# Patient Record
Sex: Female | Born: 1950 | Race: White | Hispanic: No | Marital: Married | Smoking: Never smoker
Health system: Southern US, Community
[De-identification: ages and names within clinical notes are randomized; demographics above are authoritative.]

## PROBLEM LIST (undated history)

## (undated) DIAGNOSIS — G4730 Sleep apnea, unspecified: Secondary | ICD-10-CM

## (undated) DIAGNOSIS — F32A Depression, unspecified: Secondary | ICD-10-CM

## (undated) DIAGNOSIS — E785 Hyperlipidemia, unspecified: Secondary | ICD-10-CM

## (undated) DIAGNOSIS — N183 Chronic kidney disease, stage 3 unspecified: Secondary | ICD-10-CM

## (undated) DIAGNOSIS — I10 Essential (primary) hypertension: Secondary | ICD-10-CM

## (undated) DIAGNOSIS — F419 Anxiety disorder, unspecified: Secondary | ICD-10-CM

## (undated) DIAGNOSIS — Z9889 Other specified postprocedural states: Secondary | ICD-10-CM

## (undated) DIAGNOSIS — R011 Cardiac murmur, unspecified: Secondary | ICD-10-CM

## (undated) DIAGNOSIS — K219 Gastro-esophageal reflux disease without esophagitis: Secondary | ICD-10-CM

## (undated) DIAGNOSIS — M8580 Other specified disorders of bone density and structure, unspecified site: Secondary | ICD-10-CM

## (undated) DIAGNOSIS — M1990 Unspecified osteoarthritis, unspecified site: Secondary | ICD-10-CM

## (undated) DIAGNOSIS — I2510 Atherosclerotic heart disease of native coronary artery without angina pectoris: Secondary | ICD-10-CM

## (undated) DIAGNOSIS — I38 Endocarditis, valve unspecified: Secondary | ICD-10-CM

## (undated) DIAGNOSIS — F329 Major depressive disorder, single episode, unspecified: Secondary | ICD-10-CM

## (undated) HISTORY — DX: Endocarditis, valve unspecified: I38

## (undated) HISTORY — DX: Other specified postprocedural states: Z98.890

## (undated) HISTORY — DX: Chronic kidney disease, stage 3 unspecified: N18.30

## (undated) HISTORY — PX: KNEE ARTHROCENTESIS: SUR44

## (undated) HISTORY — DX: Cardiac murmur, unspecified: R01.1

## (undated) HISTORY — PX: ABDOMINAL HYSTERECTOMY: SHX81

## (undated) HISTORY — PX: COLONOSCOPY: SHX174

## (undated) HISTORY — DX: Other specified disorders of bone density and structure, unspecified site: M85.80

---

## 2002-05-27 ENCOUNTER — Emergency Department (HOSPITAL_COMMUNITY): Admission: EM | Admit: 2002-05-27 | Discharge: 2002-05-27 | Payer: Self-pay | Admitting: Emergency Medicine

## 2002-05-27 ENCOUNTER — Encounter: Payer: Self-pay | Admitting: Emergency Medicine

## 2008-06-11 ENCOUNTER — Inpatient Hospital Stay (HOSPITAL_COMMUNITY): Admission: RE | Admit: 2008-06-11 | Discharge: 2008-06-14 | Payer: Self-pay | Admitting: Orthopedic Surgery

## 2008-06-14 ENCOUNTER — Ambulatory Visit: Payer: Self-pay | Admitting: Physical Medicine & Rehabilitation

## 2008-07-29 ENCOUNTER — Encounter: Admission: RE | Admit: 2008-07-29 | Discharge: 2008-07-29 | Payer: Self-pay | Admitting: Family Medicine

## 2010-09-18 LAB — BASIC METABOLIC PANEL
BUN: 13 mg/dL (ref 6–23)
BUN: 7 mg/dL (ref 6–23)
CO2: 29 mEq/L (ref 19–32)
CO2: 29 mEq/L (ref 19–32)
CO2: 31 mEq/L (ref 19–32)
Calcium: 8.7 mg/dL (ref 8.4–10.5)
Calcium: 8.9 mg/dL (ref 8.4–10.5)
Calcium: 9.7 mg/dL (ref 8.4–10.5)
Chloride: 93 mEq/L — ABNORMAL LOW (ref 96–112)
Chloride: 93 mEq/L — ABNORMAL LOW (ref 96–112)
Chloride: 94 mEq/L — ABNORMAL LOW (ref 96–112)
Creatinine, Ser: 0.66 mg/dL (ref 0.4–1.2)
Creatinine, Ser: 0.77 mg/dL (ref 0.4–1.2)
Creatinine, Ser: 0.84 mg/dL (ref 0.4–1.2)
Creatinine, Ser: 0.95 mg/dL (ref 0.4–1.2)
GFR calc Af Amer: >60 mL/min (ref >60)
GFR calc Af Amer: >60 mL/min (ref >60)
GFR calc Af Amer: >60 mL/min (ref >60)
GFR calc non Af Amer: >60 mL/min (ref >60)
GFR calc non Af Amer: >60 mL/min (ref >60)
GFR calc non Af Amer: >60 mL/min (ref >60)
Glucose, Bld: 120 mg/dL — ABNORMAL HIGH (ref 70–99)
Glucose, Bld: 81 mg/dL (ref 70–99)
Potassium: 4.1 mEq/L (ref 3.5–5.1)
Sodium: 131 mEq/L — ABNORMAL LOW (ref 135–145)
Sodium: 134 mEq/L — ABNORMAL LOW (ref 135–145)

## 2010-09-18 LAB — URINALYSIS, ROUTINE W REFLEX MICROSCOPIC
Glucose, UA: NEGATIVE mg/dL
Nitrite: NEGATIVE
Protein, ur: NEGATIVE mg/dL
pH: 6 (ref 5.0–8.0)

## 2010-09-18 LAB — GLUCOSE, CAPILLARY
Glucose-Capillary: 114 mg/dL — ABNORMAL HIGH (ref 70–99)
Glucose-Capillary: 150 mg/dL — ABNORMAL HIGH (ref 70–99)
Glucose-Capillary: 153 mg/dL — ABNORMAL HIGH (ref 70–99)
Glucose-Capillary: 154 mg/dL — ABNORMAL HIGH (ref 70–99)
Glucose-Capillary: 157 mg/dL — ABNORMAL HIGH (ref 70–99)
Glucose-Capillary: 164 mg/dL — ABNORMAL HIGH (ref 70–99)

## 2010-09-18 LAB — CBC
HCT: 41 % (ref 36.0–46.0)
Hemoglobin: 13.6 g/dL (ref 12.0–15.0)
Hemoglobin: 8.9 g/dL — ABNORMAL LOW (ref 12.0–15.0)
MCHC: 33.2 g/dL (ref 30.0–36.0)
MCHC: 33.8 g/dL (ref 30.0–36.0)
MCHC: 33.9 g/dL (ref 30.0–36.0)
MCV: 81.2 fL (ref 78.0–100.0)
MCV: 81.5 fL (ref 78.0–100.0)
MCV: 81.9 fL (ref 78.0–100.0)
Platelets: 286 10*3/uL (ref 150–400)
Platelets: 291 10*3/uL (ref 150–400)
Platelets: 313 10*3/uL (ref 150–400)
RBC: 3.25 MIL/uL — ABNORMAL LOW (ref 3.87–5.11)
RBC: 3.91 MIL/uL (ref 3.87–5.11)
RBC: 5.01 MIL/uL (ref 3.87–5.11)
RDW: 14.4 % (ref 11.5–15.5)
RDW: 14.6 % (ref 11.5–15.5)
WBC: 10 10*3/uL (ref 4.0–10.5)
WBC: 8.9 10*3/uL (ref 4.0–10.5)
WBC: 9.1 10*3/uL (ref 4.0–10.5)

## 2010-09-18 LAB — DIFFERENTIAL
Eosinophils Relative: 3 % (ref 0–5)
Lymphocytes Relative: 44 % (ref 12–46)
Lymphs Abs: 4 10*3/uL (ref 0.7–4.0)
Monocytes Absolute: 0.6 10*3/uL (ref 0.1–1.0)

## 2010-09-18 LAB — PROTIME-INR
INR: 1.1 (ref 0.00–1.49)
INR: 1.4 (ref 0.00–1.49)
INR: 1.7 — ABNORMAL HIGH (ref 0.00–1.49)
Prothrombin Time: 14.9 seconds (ref 11.6–15.2)
Prothrombin Time: 20.4 seconds — ABNORMAL HIGH (ref 11.6–15.2)

## 2010-09-18 LAB — TYPE AND SCREEN: ABO/RH(D): O NEG

## 2010-09-18 LAB — ABO/RH: ABO/RH(D): O NEG

## 2010-10-17 NOTE — Op Note (Signed)
Shirley Juarez, Shirley Juarez                  ACCOUNT NO.:  [id number]   MEDICAL RECORD NO.:  [id number]          PATIENT TYPE:  INP   LOCATION:  2550                         FACILITY:  [facility]   PHYSICIAN:  Almedia Balls. Ranell Patrick, M.D. DATE OF BIRTH:  August 11, 1950   DATE OF PROCEDURE:  06/11/2008  DATE OF DISCHARGE:                               OPERATIVE REPORT   PREOPERATIVE DIAGNOSIS:  Left knee end-stage osteoarthritis.   POSTOPERATIVE DIAGNOSIS:  Left knee end-stage osteoarthritis.   PROCEDURE PERFORMED:  Left total knee replacement using DePuy Sigma  rotating-platform prosthesis.   ATTENDING SURGEON:  Almedia Balls. Ranell Patrick, MD   ASSISTANT:  Donnie Coffin. Dixon, PA-C   ANESTHESIA:  General anesthesia was used.   ESTIMATED BLOOD LOSS:  Minimal.   TOURNIQUET TIME:  1 hour and 30 minutes.   INSTRUMENT COUNTS:  Correct.   COMPLICATIONS:  None.   Preoperative antibiotics were given.   FLUID REPLACEMENT:  1500 mL of crystalloid.   INDICATIONS:  The patient is a 60 year old female with a history of  worsening left knee pain secondary to known arthritis.  The patient now  has disabling pain and significant function limitations secondary to  arthritis.  She has had multiple treatments with cortisone shots and  activity modification.  The patient presents now for operative treatment  to restore function and eliminate pain.  Informed consent was obtained.   DESCRIPTION OF PROCEDURE:  After an adequate level of anesthesia was  achieved, the patient was positioned supine on the operating table.  Left leg was correctly identified and nonsterile tourniquet was placed  on the left proximal thigh.  Left leg was sterilely prepped and draped  in the usual manner.  The right leg was padded appropriately and secured  to the operating table.  After sterile prep and drape to the left knee,  we flexed the knee and made the incision with the tourniquet elevated to  300 mmHg.  Longitudinal midline incision was  created with the knee in  flexion.  Medial parapatellar arthrotomy was created.  The patella was  everted.  Lateral patellofemoral ligament was divided.  Distal femur was  entered using a step-cut drill.  The distal femoral resection guide was  utilized.  We resected 10-mm 7 and 5 degrees left.  We incised the femur  to a size 3, which we used in anterior down reference and also went  ahead and referenced our epicondylar axis for placement of our 4-in-1  cutting block.  We then cut the anterior bone, which was sliced with the  anterior femur and then the posterior bones appeared to be appropriate  size mounts the  femoral condyle, and then resected our chamfer cuts.  We then went ahead and directed our attention towards the tibial side,  removed the ACL, PCL, and meniscal tissue; we then subluxed the tibia  anteriorly, made a 90-degree posterior cut with a couple of millimeters  of bone off the affected medial side.  This was perpendicular to long  axis of the tibia with minimal posterior slope, I used an oscillating  saw.  We then removed excess posterior bone off the posterior femoral  condyles.  We then went and incised the tibia.  We first checked our  flexion and extension gaps, which were symmetric and about 15 mm.  We  then went ahead and incised her tibia to a size 2.5, used our modular  tibial drill and keel punch, and then placed her tibial component on.  We then used the best box cut guide to remove the extra bone of the  femur for placement of the posterior cruciate-substituting prosthesis.  We then placed a size 3 trial left femur, released the knee with a 15  insert and were happy with the extension and flexion stability and we  were able to obtain full extension.  We then resurfaced the patella,  going down from 24 mm to 16 mm to replace with a size 32 patellar  button, which was 8-mm thick.  We drilled the lugs for that, placed the  patella in and took the knee through a  full range of motion.  We had  excellent soft tissue balancing and patellar tracking with no fingers  technique.  We then took the trial components out and plugged the end of  the femur with available bone.  I then thoroughly irrigated the knee and  then cemented the components into place using DePuy high-viscosity  cement.  Once the cement was allowed to harden, we removed excess cement  using a quarter inch curved osteotome, retrialed with a 17.5 and a 15.  The patient could not quite achieve full extension with the 17.5, best  when he had it move with 15 insert, it was slightly released with the  knee in flexion.  I felt like this would tighten up though with time.  The extension was perfect with no hyperextension.  At this point, we  placed the real 15 component in place, reduced the knee, again took her  through a full range of motion and were happy with that.  We thoroughly  irrigated and then closed the parapatellar arthrotomy with interrupted  #1 Vicryl suture followed by 2-0 Vicryl layered subcutaneous closure and  4-0 Monocryl for skin.  Steri-Strips were applied followed by sterile  dressing.  The patient tolerated the surgery well.      Almedia Balls. Ranell Patrick, M.D.  Electronically Signed     SRN/MEDQ  D:  06/11/2008  T:  06/12/2008  Job:  [id number]

## 2010-10-17 NOTE — Discharge Summary (Signed)
Shirley Juarez, Shirley Juarez                  ACCOUNT NO.:  [id number]   MEDICAL RECORD NO.:  [id number]          PATIENT TYPE:  INP   LOCATION:  5004                         FACILITY:  [facility]   PHYSICIAN:  Almedia Balls. Ranell Patrick, M.D. DATE OF BIRTH:  Apr 07, 1951   DATE OF ADMISSION:  06/11/2008  DATE OF DISCHARGE:  06/14/2008                               DISCHARGE SUMMARY   ADMISSION DIAGNOSIS:  Left knee end-stage osteoarthritis.   DISCHARGE DIAGNOSES:  1. Left knee end-stage osteoarthritis status post total knee      arthroplasty.  2. Blood loss anemia.   BRIEF HISTORY:  The patient is a 60 year old female with worsening left  knee pain secondary to osteoarthritis.  The patient elected to have a  left total knee arthroplasty.   PROCEDURE:  The patient had left total knee arthroplasty by Dr. Malon Kindle on June 11, 2008.  Assistant was [employer].  General  anesthesia was used.  Estimated blood loss was minimal.  No  complications.   HOSPITAL COURSE:  The patient was admitted on June 11, 2008, for the  above-stated procedure, which she tolerated well.  After adequate time  in the postanesthesia care unit, she was transferred up to 5000.  Postop  day #1, the patient had a moderate-to-severe pain in that left knee, was  able to work with physical therapy somewhat, and also worked with CPM.  Neurovascularly, she was intact.  Labs did show some mild blood loss  anemia, but she was asymptomatic.  The patient did continue with  physical therapy quite well over the next couple of days without any  dizziness but did drop down to 8.9 of her hemoglobin.  On date of  discharge, the patient was feeling well thus she is going to be  discharged home on ferrous sulfate.  Her INR was 1.7.  At the time of  discharge, her wound was healing well.  No signs of cellulitis,  erythema, or infection.  Neurovascularly, she is intact distally.   DISCHARGE PLAN:  The patient will be discharged home on  June 14, 2008.   FOLLOWUP:  The patient is to follow back up with Dr. Malon Kindle in 2  weeks.   CONDITION:  Stable.   Diet is regular.   ALLERGIES:  The patient has an allergy to CODEINE.   DISCHARGE MEDICATIONS:  1. Xanax 0.5 mg p.o. q.8 h. P.r.n.  2. Coumadin per pharmacy protocol.  3. Ferrous sulfate 325 mg p.o. t.i.d. with food.  4. Hydrochlorothiazide 25 mg p.o. nightly.  5. Insulin 111 units subcu t.i.d. with food.  6. Claritin 10 mg p.o. daily.  7. Glucophage 500 mg p.o. b.i.d.  8. Robaxin 500 mg q.6 h.  9. Sular 25.5 mg p.o. daily.  10.Benicar 40 mg p.o. nightly.  11.Protonix 40 mg p.o. daily.  12.Effexor 75 mg p.o. nightly.  13.Percocet 5/325 one to two tablets q.4-6 h. p.r.n. pain.      Thomas B. Durwin Nora, P.A.      Almedia Balls. Ranell Patrick, M.D.  Electronically Signed    TBD/MEDQ  D:  06/14/2008  T:  06/14/2008  Job:  [id number]

## 2010-10-20 NOTE — H&P (Signed)
Shirley Juarez, Shirley Juarez                  ACCOUNT NO.:  [id number]   MEDICAL RECORD NO.:  [id number]          PATIENT TYPE:  INP   LOCATION:                               FACILITY:  [facility]   PHYSICIAN:  Almedia Balls. Ranell Patrick, M.D. DATE OF BIRTH:  13-May-1951   DATE OF ADMISSION:  06/12/2007  DATE OF DISCHARGE:                              HISTORY & PHYSICAL   CHIEF COMPLAINT:  Left knee pain.   HISTORY OF PRESENT ILLNESS:  The patient is a 60 year old female with  worsening left knee pain and has been refractory to conservative  treatment.  The patient has elected to have a total knee arthroplasty.   PAST MEDICAL HISTORY:  1. Diabetes.  2. Hypertension.  3. Vertigo.  4. Sleep apnea.  5. GERD.  6. Hyperlipidemia.   FAMILY MEDICAL HISTORY:  COPD.   SOCIAL HISTORY:  Patient of Dr. Shaune Pollack.  Is married.  Does not  smoke or use alcohol.   DRUG ALLERGIES:  CODEINE.   CURRENT MEDICATIONS:  1. Zetia 10 mg p.o. daily.  2. Sular 20 mg p.o. daily.  3. Benicar 40/12.5 mg p.o. daily.  4. Prevacid 30 mg p.o. daily.  5. Zyrtec 10 mg p.o. daily.  6. Hydroxyzine 25 mg p.o. daily.  7. Xanax 0.5 mg t.i.d. p.r.n.  8. Metformin 500 mg b.i.d.  9. Pristiq 1 tablet daily.   REVIEW OF SYSTEMS:  She has pain with ambulation.   PHYSICAL EXAMINATION:  VITAL SIGNS:  Pulse 78, respirations 16, blood  pressure 128/72.  GENERAL:  Otherwise, a healthy-appearing 60 year old female in no acute  distress.  Pleasant mood and affect, alert and oriented x3.  HEAD AND NECK:  Cranial nerves II through XII grossly intact.  She has  full range of motion without any tenderness.  CHEST:  Active breath sounds bilaterally.  No wheezes, rhonchi, or  rales.  HEART:  Regular rate and rhythm.  No murmur.  ABDOMEN:  Nontender, nondistended with active bowel sounds.  EXTREMITIES:  She has moderate tenderness to the left knee, especially  medially with decreased range of motion.  SKIN:  No rashes or edema.  NEUROVASCULAR:   She is intact distally.  She does have a normal heel-toe  gait.   X-rays show end-stage osteoarthritis, left knee.   IMPRESSION:  End-stage osteoarthritis, left knee.   PLAN OF ACTION:  Left total knee arthroplasty by Dr. Malon Kindle.      Thomas B. Durwin Nora, P.A.      Almedia Balls. Ranell Patrick, M.D.  Electronically Signed    TBD/MEDQ  D:  05/18/2008  T:  05/18/2008  Job:  [id number]

## 2011-06-06 ENCOUNTER — Encounter (HOSPITAL_COMMUNITY): Payer: Self-pay | Admitting: Pharmacy Technician

## 2011-06-08 ENCOUNTER — Encounter (HOSPITAL_COMMUNITY)
Admission: RE | Admit: 2011-06-08 | Discharge: 2011-06-08 | Disposition: A | Discharge status: Home / Self Care | Payer: Federal, State, Local not specified - PPO | Source: Ambulatory Visit | Attending: Anesthesiology | Admitting: Anesthesiology

## 2011-06-08 ENCOUNTER — Encounter (HOSPITAL_COMMUNITY)
Admission: RE | Admit: 2011-06-08 | Discharge: 2011-06-08 | Disposition: A | Discharge status: Home / Self Care | Payer: Federal, State, Local not specified - PPO | Source: Ambulatory Visit | Attending: Orthopedic Surgery | Admitting: Orthopedic Surgery

## 2011-06-08 ENCOUNTER — Encounter (HOSPITAL_COMMUNITY): Payer: Self-pay

## 2011-06-08 ENCOUNTER — Other Ambulatory Visit: Payer: Self-pay

## 2011-06-08 HISTORY — DX: Unspecified osteoarthritis, unspecified site: M19.90

## 2011-06-08 HISTORY — DX: Hyperlipidemia, unspecified: E78.5

## 2011-06-08 HISTORY — DX: Gastro-esophageal reflux disease without esophagitis: K21.9

## 2011-06-08 HISTORY — DX: Essential (primary) hypertension: I10

## 2011-06-08 HISTORY — DX: Depression, unspecified: F32.A

## 2011-06-08 HISTORY — DX: Anxiety disorder, unspecified: F41.9

## 2011-06-08 HISTORY — DX: Atherosclerotic heart disease of native coronary artery without angina pectoris: I25.10

## 2011-06-08 HISTORY — DX: Sleep apnea, unspecified: G47.30

## 2011-06-08 HISTORY — DX: Major depressive disorder, single episode, unspecified: F32.9

## 2011-06-08 LAB — CBC
Hemoglobin: 12.1 g/dL (ref 12.0–15.0)
MCH: 26.9 pg (ref 26.0–34.0)
MCHC: 32.9 g/dL (ref 30.0–36.0)
MCV: 81.8 fL (ref 78.0–100.0)
RBC: 4.5 MIL/uL (ref 3.87–5.11)

## 2011-06-08 LAB — BASIC METABOLIC PANEL
BUN: 15 mg/dL (ref 6–23)
CO2: 28 mEq/L (ref 19–32)
Calcium: 9.6 mg/dL (ref 8.4–10.5)
Creatinine, Ser: 0.97 mg/dL (ref 0.50–1.10)
GFR calc non Af Amer: 62 mL/min — ABNORMAL LOW (ref >90)
Glucose, Bld: 98 mg/dL (ref 70–99)

## 2011-06-08 NOTE — Pre-Procedure Instructions (Addendum)
20 Shirley Juarez  06/08/2011   Your procedure is scheduled on: Thursday, January 18th. Report to [facility] at 8:00  AM.  Call this number if you have problems the morning of surgery: [phone]   Remember:   Do not eat food:After Midnight.  May have clear liquids: up to 4 Hours before arrival. (until 4:00am)  Clear liquids include soda, tea, black coffee, apple or grape juice, broth.  Take these medicines the morning of surgery with A SIP OF WATER:  Xanax, Zyreck, Pristiq, Prevacid, Sular   Do not wear jewelry, make-up or nail polish.  Do not wear lotions, powders, or perfumes. You may wear deodorant.  Do not shave 48 hours prior to surgery.  Do not bring valuables to the hospital.  Contacts, dentures or bridgework may not be worn into surgery.  Leave suitcase in the car. After surgery it may be brought to your room.  For patients admitted to the hospital, checkout time is 11:00 AM the day of discharge.   Patients discharged the day of surgery will not be allowed to drive home.  Name and phone number of your driver: NA  Special Instructions: CHG Shower Use Special Wash: 1/2 bottle night before surgery and 1/2 bottle morning of surgery.   Please read over the following fact sheets that you were given: Pain Booklet, Coughing and Deep Breathing, Blood Transfusion Information, MRSA Information and Surgical Site Infection Prevention

## 2011-06-08 NOTE — Progress Notes (Signed)
Pt was given information on Incentive and reviewed.  Pt states she has used I /S before and did not need further instructions.

## 2011-06-14 NOTE — H&P (Signed)
CC: right Knee pain HPI:   61 y/o female with worsening knee pain secondary to osteoarthritis. Patient has elected to have a total knee arthroplasty to decrease pain and increase function. UYQ:[id number], hyperlipidemia, GERD, diabetes  Mellitus, coronary artery disease Family History:CAD Social:no smoking, no etoh,  Dr. Shaune Pollack Meds:citracal, zyrtec, aspirin, prevacid, gemfibrozil, sular, xanax, pristig, zetia, metformin, estradiol, losartan, hydroxyzine Allergies: codeine, zocor, lipitor, crestor, welchol, fishoil, azithromycin ROS: Pain with ambulation Vitals:74 16 138/80 PE: Alert and appropriate 60 y/o female In no acute distress Cranial 2-12 grossly intact Cervical spine full rom without pain Lungs: bilateral breath sounds with no wheeze rhonchi or rales Heart: regular rate and rhythm with no murmur Abd: nontender nondistended with active bowel sounds Ext: moderate pain with rom of the right knee with crepitus, antalgic gait, neurovascularly intact distally. No pedal edema Skin: no rashes X-rays: endstage osteoarthritis to knee A/P: endstage osteoarthritis to knee Plan for total knee arthroplasty to decrease pain and increase function.

## 2011-06-21 MED ORDER — CHLORHEXIDINE GLUCONATE 4 % EX LIQD: 60.0000 mL | Freq: Once | CUTANEOUS | Status: DC

## 2011-06-21 MED ORDER — CEFAZOLIN SODIUM 1-5 GM-% IV SOLN
1.0000 g | INTRAVENOUS | Status: AC
  Administered 2011-06-22: 1 g via INTRAVENOUS
  Filled 2011-06-21: qty 50

## 2011-06-22 ENCOUNTER — Encounter (HOSPITAL_COMMUNITY): Payer: Self-pay | Admitting: Anesthesiology

## 2011-06-22 ENCOUNTER — Inpatient Hospital Stay (HOSPITAL_COMMUNITY)
Admission: RE | Admit: 2011-06-22 | Discharge: 2011-06-25 | DRG: 209 | Disposition: A | Discharge status: Home Health | Payer: Federal, State, Local not specified - PPO | Source: Ambulatory Visit | Attending: Orthopedic Surgery | Admitting: Orthopedic Surgery

## 2011-06-22 ENCOUNTER — Encounter (HOSPITAL_COMMUNITY)
Admission: RE | Disposition: A | Discharge status: Home Health | Payer: Self-pay | Source: Ambulatory Visit | Attending: Orthopedic Surgery

## 2011-06-22 ENCOUNTER — Encounter (HOSPITAL_COMMUNITY): Payer: Self-pay | Admitting: *Deleted

## 2011-06-22 ENCOUNTER — Ambulatory Visit (HOSPITAL_COMMUNITY): Payer: Federal, State, Local not specified - PPO | Admitting: Anesthesiology

## 2011-06-22 ENCOUNTER — Ambulatory Visit (HOSPITAL_COMMUNITY): Payer: Federal, State, Local not specified - PPO

## 2011-06-22 DIAGNOSIS — Z01812 Encounter for preprocedural laboratory examination: Secondary | ICD-10-CM

## 2011-06-22 DIAGNOSIS — K219 Gastro-esophageal reflux disease without esophagitis: Secondary | ICD-10-CM | POA: Diagnosis present

## 2011-06-22 DIAGNOSIS — E785 Hyperlipidemia, unspecified: Secondary | ICD-10-CM | POA: Diagnosis present

## 2011-06-22 DIAGNOSIS — M1710 Unilateral primary osteoarthritis, unspecified knee: Principal | ICD-10-CM | POA: Diagnosis present

## 2011-06-22 DIAGNOSIS — I2510 Atherosclerotic heart disease of native coronary artery without angina pectoris: Secondary | ICD-10-CM | POA: Diagnosis present

## 2011-06-22 DIAGNOSIS — I10 Essential (primary) hypertension: Secondary | ICD-10-CM | POA: Diagnosis present

## 2011-06-22 DIAGNOSIS — E119 Type 2 diabetes mellitus without complications: Secondary | ICD-10-CM | POA: Diagnosis present

## 2011-06-22 HISTORY — PX: TOTAL KNEE ARTHROPLASTY: SHX125

## 2011-06-22 LAB — GLUCOSE, CAPILLARY: Glucose-Capillary: 143 mg/dL — ABNORMAL HIGH (ref 70–99)

## 2011-06-22 SURGERY — ARTHROPLASTY, KNEE, TOTAL
Anesthesia: General | Site: Knee | Laterality: Right | Wound class: Clean

## 2011-06-22 MED ORDER — METOCLOPRAMIDE HCL 10 MG PO TABS: 5.0000 mg | ORAL_TABLET | Freq: Three times a day (TID) | ORAL | Status: DC | PRN

## 2011-06-22 MED ORDER — METOCLOPRAMIDE HCL 5 MG/ML IJ SOLN: 10.0000 mg | Freq: Once | INTRAMUSCULAR | Status: DC | PRN

## 2011-06-22 MED ORDER — DIPHENHYDRAMINE HCL 50 MG/ML IJ SOLN: 12.5000 mg | Freq: Four times a day (QID) | INTRAMUSCULAR | Status: DC | PRN

## 2011-06-22 MED ORDER — LIDOCAINE HCL (PF) 1 % IJ SOLN
INTRAMUSCULAR | Status: DC | PRN
  Administered 2011-06-22: 2 mL

## 2011-06-22 MED ORDER — DESVENLAFAXINE SUCCINATE ER 50 MG PO TB24
50.0000 mg | ORAL_TABLET | Freq: Every day | ORAL | Status: DC
  Administered 2011-06-22 – 2011-06-23 (×2): 50 mg via ORAL
  Filled 2011-06-22 (×4): qty 1

## 2011-06-22 MED ORDER — METOCLOPRAMIDE HCL 5 MG/ML IJ SOLN
INTRAMUSCULAR | Status: DC | PRN
  Administered 2011-06-22: 10 mg via INTRAVENOUS

## 2011-06-22 MED ORDER — ONDANSETRON HCL 4 MG/2ML IJ SOLN
4.0000 mg | Freq: Four times a day (QID) | INTRAMUSCULAR | Status: DC | PRN
  Administered 2011-06-22 – 2011-06-23 (×3): 4 mg via INTRAVENOUS

## 2011-06-22 MED ORDER — NISOLDIPINE ER 25.5 MG PO TB24
25.5000 mg | ORAL_TABLET | ORAL | Status: DC
  Administered 2011-06-23 – 2011-06-25 (×3): 25.5 mg via ORAL
  Filled 2011-06-22 (×4): qty 1

## 2011-06-22 MED ORDER — DROPERIDOL 2.5 MG/ML IJ SOLN
INTRAMUSCULAR | Status: DC | PRN
  Administered 2011-06-22: 0.625 mg via INTRAVENOUS

## 2011-06-22 MED ORDER — ONDANSETRON HCL 4 MG/2ML IJ SOLN
INTRAMUSCULAR | Status: DC | PRN
  Administered 2011-06-22: 4 mg via INTRAVENOUS

## 2011-06-22 MED ORDER — CEFAZOLIN SODIUM 1-5 GM-% IV SOLN
1.0000 g | Freq: Four times a day (QID) | INTRAVENOUS | Status: AC
  Administered 2011-06-22 – 2011-06-23 (×3): 1 g via INTRAVENOUS
  Filled 2011-06-22 (×3): qty 50

## 2011-06-22 MED ORDER — EZETIMIBE 10 MG PO TABS
10.0000 mg | ORAL_TABLET | Freq: Every day | ORAL | Status: DC
  Administered 2011-06-22 – 2011-06-24 (×3): 10 mg via ORAL
  Filled 2011-06-22 (×4): qty 1

## 2011-06-22 MED ORDER — METFORMIN HCL 500 MG PO TABS
500.0000 mg | ORAL_TABLET | Freq: Two times a day (BID) | ORAL | Status: DC
  Administered 2011-06-22 – 2011-06-25 (×6): 500 mg via ORAL
  Filled 2011-06-22 (×8): qty 1

## 2011-06-22 MED ORDER — CALCIUM CARBONATE-VITAMIN D 600-400 MG-UNIT PO CHEW: 1.0000 | CHEWABLE_TABLET | Freq: Every day | ORAL | Status: DC

## 2011-06-22 MED ORDER — BUPIVACAINE HCL (PF) 0.5 % IJ SOLN
INTRAMUSCULAR | Status: DC | PRN
  Administered 2011-06-22: 25 mL

## 2011-06-22 MED ORDER — GLYCOPYRROLATE 0.2 MG/ML IJ SOLN
INTRAMUSCULAR | Status: DC | PRN
Start: 2011-06-22 — End: 2011-06-22
  Administered 2011-06-22: .4 mg via INTRAVENOUS

## 2011-06-22 MED ORDER — LOSARTAN POTASSIUM-HCTZ 100-25 MG PO TABS: 1.0000 | ORAL_TABLET | Freq: Every day | ORAL | Status: DC

## 2011-06-22 MED ORDER — ACETAMINOPHEN 500 MG PO TABS: 1000.0000 mg | ORAL_TABLET | Freq: Four times a day (QID) | ORAL | Status: DC | PRN

## 2011-06-22 MED ORDER — SODIUM CHLORIDE 0.9 % IV SOLN: INTRAVENOUS | Status: DC

## 2011-06-22 MED ORDER — CALCIUM CITRATE 950 (200 CA) MG PO TABS
1.0000 | ORAL_TABLET | Freq: Every day | ORAL | Status: DC
  Administered 2011-06-22 – 2011-06-25 (×4): 200 mg via ORAL
  Filled 2011-06-22 (×4): qty 1

## 2011-06-22 MED ORDER — LORATADINE 10 MG PO TABS
10.0000 mg | ORAL_TABLET | Freq: Every day | ORAL | Status: DC
  Administered 2011-06-22: 10 mg via ORAL
  Filled 2011-06-22 (×5): qty 1

## 2011-06-22 MED ORDER — OXYCODONE-ACETAMINOPHEN 5-325 MG PO TABS
1.0000 | ORAL_TABLET | ORAL | Status: DC | PRN
  Administered 2011-06-23: 2 via ORAL
  Administered 2011-06-23: 1 via ORAL
  Administered 2011-06-23: 2 via ORAL
  Administered 2011-06-23: 1 via ORAL
  Administered 2011-06-24 – 2011-06-25 (×6): 2 via ORAL
  Filled 2011-06-22 (×3): qty 2
  Filled 2011-06-22: qty 1
  Filled 2011-06-22 (×4): qty 2
  Filled 2011-06-22: qty 1
  Filled 2011-06-22: qty 2

## 2011-06-22 MED ORDER — WARFARIN VIDEO: Freq: Once | Status: DC

## 2011-06-22 MED ORDER — HYDROMORPHONE HCL PF 1 MG/ML IJ SOLN
0.2500 mg | INTRAMUSCULAR | Status: DC | PRN
  Administered 2011-06-22 (×2): 0.5 mg via INTRAVENOUS

## 2011-06-22 MED ORDER — HYDROMORPHONE HCL PF 1 MG/ML IJ SOLN
0.5000 mg | INTRAMUSCULAR | Status: DC | PRN
  Administered 2011-06-22 (×2): 1 mg via INTRAVENOUS
  Filled 2011-06-22 (×2): qty 1

## 2011-06-22 MED ORDER — HYDROMORPHONE 0.3 MG/ML IV SOLN
INTRAVENOUS | Status: DC
  Administered 2011-06-22: 15:00:00 via INTRAVENOUS
  Administered 2011-06-23: 2.7 mg via INTRAVENOUS
  Filled 2011-06-22: qty 25

## 2011-06-22 MED ORDER — ALPRAZOLAM 0.5 MG PO TABS
0.5000 mg | ORAL_TABLET | ORAL | Status: DC
  Administered 2011-06-23 – 2011-06-25 (×3): 0.5 mg via ORAL
  Filled 2011-06-22 (×3): qty 1

## 2011-06-22 MED ORDER — FERROUS SULFATE 325 (65 FE) MG PO TABS
325.0000 mg | ORAL_TABLET | Freq: Three times a day (TID) | ORAL | Status: DC
  Administered 2011-06-22 – 2011-06-25 (×9): 325 mg via ORAL
  Filled 2011-06-22 (×12): qty 1

## 2011-06-22 MED ORDER — PATIENT'S GUIDE TO USING COUMADIN BOOK
Freq: Once | Status: AC
  Administered 2011-06-22: 16:00:00
  Filled 2011-06-22: qty 1

## 2011-06-22 MED ORDER — ASPIRIN EC 81 MG PO TBEC
81.0000 mg | DELAYED_RELEASE_TABLET | Freq: Every day | ORAL | Status: DC
  Administered 2011-06-22 – 2011-06-24 (×3): 81 mg via ORAL
  Filled 2011-06-22 (×4): qty 1

## 2011-06-22 MED ORDER — MENTHOL 3 MG MT LOZG: 1.0000 | LOZENGE | OROMUCOSAL | Status: DC | PRN

## 2011-06-22 MED ORDER — CALCIUM CARBONATE-VITAMIN D 500-200 MG-UNIT PO TABS
1.0000 | ORAL_TABLET | Freq: Every day | ORAL | Status: DC
  Administered 2011-06-22 – 2011-06-25 (×4): 1 via ORAL
  Filled 2011-06-22 (×4): qty 1

## 2011-06-22 MED ORDER — ESTRADIOL 2 MG PO TABS
2.0000 mg | ORAL_TABLET | Freq: Every day | ORAL | Status: DC
  Administered 2011-06-22 – 2011-06-25 (×2): 2 mg via ORAL
  Filled 2011-06-22 (×4): qty 1

## 2011-06-22 MED ORDER — METOCLOPRAMIDE HCL 5 MG/ML IJ SOLN
5.0000 mg | Freq: Three times a day (TID) | INTRAMUSCULAR | Status: DC | PRN
  Filled 2011-06-22: qty 2

## 2011-06-22 MED ORDER — HYDROXYZINE HCL 25 MG PO TABS
25.0000 mg | ORAL_TABLET | Freq: Every day | ORAL | Status: DC
  Administered 2011-06-23 – 2011-06-25 (×3): 25 mg via ORAL
  Filled 2011-06-22 (×3): qty 1

## 2011-06-22 MED ORDER — ACETAMINOPHEN 650 MG RE SUPP: 650.0000 mg | Freq: Four times a day (QID) | RECTAL | Status: DC | PRN

## 2011-06-22 MED ORDER — MIDAZOLAM HCL 5 MG/5ML IJ SOLN
INTRAMUSCULAR | Status: DC | PRN
  Administered 2011-06-22: 2 mg via INTRAVENOUS

## 2011-06-22 MED ORDER — HYDROCHLOROTHIAZIDE 25 MG PO TABS
25.0000 mg | ORAL_TABLET | Freq: Every day | ORAL | Status: DC
  Administered 2011-06-22 – 2011-06-25 (×4): 25 mg via ORAL
  Filled 2011-06-22 (×4): qty 1

## 2011-06-22 MED ORDER — ONDANSETRON HCL 4 MG/2ML IJ SOLN
4.0000 mg | Freq: Four times a day (QID) | INTRAMUSCULAR | Status: DC | PRN
  Filled 2011-06-22 (×3): qty 2

## 2011-06-22 MED ORDER — NAPHAZOLINE-PHENIRAMINE 0.025-0.3 % OP SOLN
1.0000 [drp] | Freq: Four times a day (QID) | OPHTHALMIC | Status: DC | PRN
  Filled 2011-06-22: qty 15

## 2011-06-22 MED ORDER — NALOXONE HCL 0.4 MG/ML IJ SOLN: 0.4000 mg | INTRAMUSCULAR | Status: DC | PRN

## 2011-06-22 MED ORDER — NEOSTIGMINE METHYLSULFATE 1 MG/ML IJ SOLN
INTRAMUSCULAR | Status: DC | PRN
  Administered 2011-06-22: 3 mg via INTRAVENOUS

## 2011-06-22 MED ORDER — LACTATED RINGERS IV SOLN
INTRAVENOUS | Status: DC | PRN
  Administered 2011-06-22 (×2): via INTRAVENOUS

## 2011-06-22 MED ORDER — METHOCARBAMOL 100 MG/ML IJ SOLN
500.0000 mg | Freq: Four times a day (QID) | INTRAVENOUS | Status: DC | PRN
  Filled 2011-06-22: qty 5

## 2011-06-22 MED ORDER — ONDANSETRON HCL 4 MG PO TABS
4.0000 mg | ORAL_TABLET | Freq: Four times a day (QID) | ORAL | Status: DC | PRN
  Administered 2011-06-23 – 2011-06-25 (×3): 4 mg via ORAL
  Filled 2011-06-22 (×3): qty 1

## 2011-06-22 MED ORDER — SODIUM CHLORIDE 0.9 % IR SOLN
Status: DC | PRN
  Administered 2011-06-22: 1000 mL

## 2011-06-22 MED ORDER — POTASSIUM CHLORIDE IN NACL 20-0.9 MEQ/L-% IV SOLN
INTRAVENOUS | Status: DC
  Administered 2011-06-22: 12:00:00 via INTRAVENOUS
  Filled 2011-06-22 (×5): qty 1000

## 2011-06-22 MED ORDER — FENTANYL CITRATE 0.05 MG/ML IJ SOLN: 50.0000 ug | INTRAMUSCULAR | Status: DC | PRN

## 2011-06-22 MED ORDER — LOSARTAN POTASSIUM 50 MG PO TABS
100.0000 mg | ORAL_TABLET | Freq: Every day | ORAL | Status: DC
  Administered 2011-06-22 – 2011-06-25 (×4): 100 mg via ORAL
  Filled 2011-06-22 (×4): qty 2

## 2011-06-22 MED ORDER — ROCURONIUM BROMIDE 100 MG/10ML IV SOLN
INTRAVENOUS | Status: DC | PRN
  Administered 2011-06-22: 40 mg via INTRAVENOUS

## 2011-06-22 MED ORDER — LIDOCAINE HCL (CARDIAC) 20 MG/ML IV SOLN
INTRAVENOUS | Status: DC | PRN
  Administered 2011-06-22: 40 mg via INTRAVENOUS

## 2011-06-22 MED ORDER — GEMFIBROZIL 600 MG PO TABS
600.0000 mg | ORAL_TABLET | Freq: Two times a day (BID) | ORAL | Status: DC
  Administered 2011-06-22 – 2011-06-25 (×6): 600 mg via ORAL
  Filled 2011-06-22 (×8): qty 1

## 2011-06-22 MED ORDER — SODIUM CHLORIDE 0.9 % IJ SOLN: 9.0000 mL | INTRAMUSCULAR | Status: DC | PRN

## 2011-06-22 MED ORDER — MIDAZOLAM HCL 2 MG/2ML IJ SOLN: 0.5000 mg | INTRAMUSCULAR | Status: DC | PRN

## 2011-06-22 MED ORDER — ACETAMINOPHEN 325 MG PO TABS: 650.0000 mg | ORAL_TABLET | Freq: Four times a day (QID) | ORAL | Status: DC | PRN

## 2011-06-22 MED ORDER — WARFARIN SODIUM 5 MG PO TABS
5.0000 mg | ORAL_TABLET | Freq: Once | ORAL | Status: AC
  Administered 2011-06-22: 5 mg via ORAL
  Filled 2011-06-22: qty 1

## 2011-06-22 MED ORDER — PHENOL 1.4 % MT LIQD
1.0000 | OROMUCOSAL | Status: DC | PRN
  Filled 2011-06-22: qty 177

## 2011-06-22 MED ORDER — FENTANYL CITRATE 0.05 MG/ML IJ SOLN
INTRAMUSCULAR | Status: DC | PRN
  Administered 2011-06-22 (×6): 50 ug via INTRAVENOUS

## 2011-06-22 MED ORDER — METHOCARBAMOL 100 MG/ML IJ SOLN
500.0000 mg | INTRAVENOUS | Status: AC
  Administered 2011-06-22: 500 mg via INTRAVENOUS
  Filled 2011-06-22: qty 5

## 2011-06-22 MED ORDER — METHOCARBAMOL 500 MG PO TABS
500.0000 mg | ORAL_TABLET | Freq: Four times a day (QID) | ORAL | Status: DC | PRN
  Administered 2011-06-23 – 2011-06-24 (×5): 500 mg via ORAL
  Filled 2011-06-22 (×5): qty 1

## 2011-06-22 MED ORDER — PANTOPRAZOLE SODIUM 40 MG PO TBEC
40.0000 mg | DELAYED_RELEASE_TABLET | Freq: Every day | ORAL | Status: DC
  Administered 2011-06-23 – 2011-06-24 (×2): 40 mg via ORAL
  Filled 2011-06-22 (×2): qty 1

## 2011-06-22 MED ORDER — DIPHENHYDRAMINE HCL 12.5 MG/5ML PO ELIX
12.5000 mg | ORAL_SOLUTION | Freq: Four times a day (QID) | ORAL | Status: DC | PRN
  Filled 2011-06-22: qty 5

## 2011-06-22 MED ORDER — PROPOFOL 10 MG/ML IV EMUL
INTRAVENOUS | Status: DC | PRN
  Administered 2011-06-22: 200 mg via INTRAVENOUS

## 2011-06-22 MED ORDER — CLOBETASOL PROPIONATE 0.05 % EX CREA: 1.0000 "application " | TOPICAL_CREAM | Freq: Two times a day (BID) | CUTANEOUS | Status: DC | PRN

## 2011-06-22 SURGICAL SUPPLY — 66 items
BANDAGE ELASTIC 4 VELCRO ST LF (GAUZE/BANDAGES/DRESSINGS) ×1 IMPLANT
BANDAGE ELASTIC 6 VELCRO ST LF (GAUZE/BANDAGES/DRESSINGS) ×1 IMPLANT
BANDAGE ESMARK 6X9 LF (GAUZE/BANDAGES/DRESSINGS) ×1 IMPLANT
BANDAGE GAUZE ELAST BULKY 4 IN (GAUZE/BANDAGES/DRESSINGS) ×3 IMPLANT
BLADE SAG 18X100X1.27 (BLADE) ×2 IMPLANT
BLADE SAW SGTL 13.0X1.19X90.0M (BLADE) ×2 IMPLANT
BNDG CMPR 9X6 STRL LF SNTH (GAUZE/BANDAGES/DRESSINGS) ×1
BNDG CMPR MED 10X6 ELC LF (GAUZE/BANDAGES/DRESSINGS)
BNDG ELASTIC 6X10 VLCR STRL LF (GAUZE/BANDAGES/DRESSINGS) IMPLANT
BNDG ESMARK 6X9 LF (GAUZE/BANDAGES/DRESSINGS) ×2
BOWL SMART MIX CTS (DISPOSABLE) ×2 IMPLANT
CEMENT HV SMART SET (Cement) ×4 IMPLANT
CLOSURE STERI STRIP 1/2 X4 (GAUZE/BANDAGES/DRESSINGS) ×1 IMPLANT
CLOTH BEACON ORANGE TIMEOUT ST (SAFETY) ×2 IMPLANT
COVER BACK TABLE 24X17X13 BIG (DRAPES) IMPLANT
COVER SURGICAL LIGHT HANDLE (MISCELLANEOUS) ×2 IMPLANT
CUFF TOURNIQUET SINGLE 34IN LL (TOURNIQUET CUFF) ×1 IMPLANT
CUFF TOURNIQUET SINGLE 44IN (TOURNIQUET CUFF) IMPLANT
DRAPE EXTREMITY T 121X128X90 (DRAPE) ×2 IMPLANT
DRAPE PROXIMA HALF (DRAPES) ×2 IMPLANT
DRAPE U-SHAPE 47X51 STRL (DRAPES) ×2 IMPLANT
DRSG ADAPTIC 3X8 NADH LF (GAUZE/BANDAGES/DRESSINGS) ×1 IMPLANT
DRSG PAD ABDOMINAL 8X10 ST (GAUZE/BANDAGES/DRESSINGS) ×2 IMPLANT
DURAPREP 26ML APPLICATOR (WOUND CARE) ×2 IMPLANT
ELECT CAUTERY BLADE 6.4 (BLADE) ×2 IMPLANT
ELECT REM PT RETURN 9FT ADLT (ELECTROSURGICAL) ×2
ELECTRODE REM PT RTRN 9FT ADLT (ELECTROSURGICAL) ×1 IMPLANT
FACESHIELD LNG OPTICON STERILE (SAFETY) ×4 IMPLANT
GLOVE BIOGEL PI IND STRL 7.0 (GLOVE) IMPLANT
GLOVE BIOGEL PI INDICATOR 7.0 (GLOVE) ×1
GLOVE BIOGEL PI ORTHO PRO 7.5 (GLOVE) ×1
GLOVE BIOGEL PI ORTHO PRO SZ7 (GLOVE) ×1
GLOVE BIOGEL PI ORTHO PRO SZ8 (GLOVE) ×1
GLOVE ORTHO TXT STRL SZ7.5 (GLOVE) ×2 IMPLANT
GLOVE PI ORTHO PRO STRL 7.5 (GLOVE) ×1 IMPLANT
GLOVE PI ORTHO PRO STRL SZ7 (GLOVE) IMPLANT
GLOVE PI ORTHO PRO STRL SZ8 (GLOVE) ×1 IMPLANT
GLOVE SURG ORTHO 8.5 STRL (GLOVE) ×3 IMPLANT
GLOVE SURG SS PI 6.5 STRL IVOR (GLOVE) ×1 IMPLANT
GLOVE SURG SS PI 7.0 STRL IVOR (GLOVE) ×1 IMPLANT
GOWN STRL NON-REIN LRG LVL3 (GOWN DISPOSABLE) ×2 IMPLANT
GOWN STRL REIN XL XLG (GOWN DISPOSABLE) ×4 IMPLANT
HANDPIECE INTERPULSE COAX TIP (DISPOSABLE) ×2
IMMOBILIZER KNEE 22 UNIV (SOFTGOODS) ×1 IMPLANT
KIT BASIN OR (CUSTOM PROCEDURE TRAY) ×2 IMPLANT
KIT MANIFOLD (MISCELLANEOUS) ×2 IMPLANT
KIT ROOM TURNOVER OR (KITS) ×2 IMPLANT
MANIFOLD NEPTUNE II (INSTRUMENTS) ×2 IMPLANT
NS IRRIG 1000ML POUR BTL (IV SOLUTION) ×2 IMPLANT
PACK TOTAL JOINT (CUSTOM PROCEDURE TRAY) ×2 IMPLANT
PAD ARMBOARD 7.5X6 YLW CONV (MISCELLANEOUS) ×4 IMPLANT
SET HNDPC FAN SPRY TIP SCT (DISPOSABLE) ×1 IMPLANT
SPONGE GAUZE 4X4 12PLY (GAUZE/BANDAGES/DRESSINGS) ×1 IMPLANT
STRIP CLOSURE SKIN 1/2X4 (GAUZE/BANDAGES/DRESSINGS) ×2 IMPLANT
SUCTION FRAZIER TIP 10 FR DISP (SUCTIONS) ×2 IMPLANT
SUT MNCRL AB 3-0 PS2 18 (SUTURE) ×2 IMPLANT
SUT VIC AB 0 CT1 27 (SUTURE) ×4
SUT VIC AB 0 CT1 27XBRD ANBCTR (SUTURE) ×2 IMPLANT
SUT VIC AB 1 CT1 27 (SUTURE) ×6
SUT VIC AB 1 CT1 27XBRD ANBCTR (SUTURE) ×3 IMPLANT
SUT VIC AB 2-0 CT1 27 (SUTURE) ×4
SUT VIC AB 2-0 CT1 TAPERPNT 27 (SUTURE) ×2 IMPLANT
TOWEL OR 17X24 6PK STRL BLUE (TOWEL DISPOSABLE) ×2 IMPLANT
TOWEL OR 17X26 10 PK STRL BLUE (TOWEL DISPOSABLE) ×2 IMPLANT
TRAY FOLEY CATH 14FR (SET/KITS/TRAYS/PACK) ×2 IMPLANT
WATER STERILE IRR 1000ML POUR (IV SOLUTION) ×8 IMPLANT

## 2011-06-22 NOTE — Anesthesia Preprocedure Evaluation (Signed)
Anesthesia Evaluation  Patient identified by MRN, date of birth, ID band Patient awake    Reviewed: Allergy & Precautions, H&P , NPO status , Patient's Chart, lab work & pertinent test results, reviewed documented beta blocker date and time   Airway Mallampati: III TM Distance: >3 FB Neck ROM: full    Dental   Pulmonary sleep apnea and Continuous Positive Airway Pressure Ventilation ,          Cardiovascular hypertension, + CAD and + Past MI     Neuro/Psych PSYCHIATRIC DISORDERS Anxiety Depression Negative Neurological ROS     GI/Hepatic Neg liver ROS, GERD-  Medicated and Controlled,  Endo/Other  Diabetes mellitus-, Well Controlled, Type 2  Renal/GU negative Renal ROS  Genitourinary negative   Musculoskeletal   Abdominal   Peds  Hematology negative hematology ROS (+)   Anesthesia Other Findings See surgeon's H&P   Reproductive/Obstetrics negative OB ROS                           Anesthesia Physical Anesthesia Plan  ASA: III  Anesthesia Plan: General   Post-op Pain Management: MAC Combined w/ Regional for Post-op pain   Induction: Intravenous  Airway Management Planned: Oral ETT  Additional Equipment:   Intra-op Plan:   Post-operative Plan: Extubation in OR  Informed Consent: I have reviewed the patients History and Physical, chart, labs and discussed the procedure including the risks, benefits and alternatives for the proposed anesthesia with the patient or authorized representative who has indicated his/her understanding and acceptance.     Plan Discussed with: CRNA and Surgeon  Anesthesia Plan Comments:         Anesthesia Quick Evaluation

## 2011-06-22 NOTE — Preoperative (Signed)
Beta Blockers   Reason not to administer Beta Blockers:Not Applicable 

## 2011-06-22 NOTE — Interval H&P Note (Signed)
History and Physical Interval Note:  06/22/2011 7:28 AM  Shirley Juarez  has presented today for surgery, with the diagnosis of right knee OA   The various methods of treatment have been discussed with the patient and family. After consideration of risks, benefits and other options for treatment, the patient has consented to  Procedure(s): TOTAL KNEE ARTHROPLASTY as a surgical intervention .  The patients' history has been reviewed, patient examined, no change in status, stable for surgery.  I have reviewed the patients' chart and labs.  Questions were answered to the patient's satisfaction.     Shaine Mount,STEVEN R

## 2011-06-22 NOTE — Anesthesia Postprocedure Evaluation (Signed)
Anesthesia Post Note  Patient: Shirley Juarez  Procedure(s) Performed:  TOTAL KNEE ARTHROPLASTY - Right Total Knee Arthroplasty  Anesthesia type: general  Patient location: PACU  Post pain: Pain level controlled  Post assessment: Patient's Cardiovascular Status Stable  Last Vitals:  Filed Vitals:   06/22/11 0942  BP:   Pulse: 81  Temp:   Resp: 29    Post vital signs: Reviewed and stable  Level of consciousness: sedated  Complications: No apparent anesthesia complications

## 2011-06-22 NOTE — Progress Notes (Signed)
Orthopedic Tech Progress Note Patient Details:  Shirley Juarez 05-Jan-1951 [id number]  CPM Right Knee Right Knee Flexion (Degrees): 60  Right Knee Extension (Degrees): 0  Additional Comments: trapeze bar       Cammer, Mickie Bail 06/22/2011, 11:30 AM

## 2011-06-22 NOTE — Progress Notes (Addendum)
ANTICOAGULATION CONSULT NOTE - Initial Consult  Pharmacy Consult for coumadin Indication: VTE prophylaxis  Allergies  Allergen Reactions  . Latex Rash  . Codeine Nausea And Vomiting  . Crestor (Rosuvastatin Calcium) Nausea And Vomiting  . Fish Oil Nausea And Vomiting  . Lipitor (Atorvastatin Calcium) Nausea And Vomiting  . Peanut-Containing Drug Products Hives  . Welchol (Colesevelam Hcl) Nausea And Vomiting  . Zithromax (Azithromycin) Nausea And Vomiting    Vital Signs: Temp: 98.1 F (36.7 C) (01/18 1106) Temp src: Oral (01/18 0558) BP: 126/65 mmHg (01/18 1056) Pulse Rate: 79  (01/18 1056)  Labs:  Baseline labs 06/08/11:   H/H 12.2/36.8 Platelets:  349k Baseline INR 06/22/11 = 1.08  No results found for this basename: HGB:2,HCT:3,PLT:3,APTT:3,LABPROT:3,INR:3,HEPARINUNFRC:3,CREATININE:3,CKTOTAL:3,CKMB:3,TROPONINI:3 in the last 72 hours CrCl is unknown because there is no height on file for the current visit.  Medical History: Past Medical History  Diagnosis Date  . Hypertension   . Hyperlipidemia   . Coronary artery disease   . Myocardial infarction   . Anxiety   . Sleep apnea     does not use CPAP- it is broken.  Last sleep study was 7 years ago.  Has not worn in  4  years  . Depression   . GERD (gastroesophageal reflux disease)   . Arthritis     Osteoarthritis- knee and back  . Diabetes mellitus     On oral and diet    Medications:  Prescriptions prior to admission  Medication Sig Dispense Refill  . acetaminophen (TYLENOL) 500 MG tablet Take 1,000 mg by mouth every 6 (six) hours as needed. For pain       . ALPRAZolam (XANAX) 0.5 MG tablet Take 0.5 mg by mouth every morning.        Marland Kitchen aspirin EC 81 MG tablet Take 81 mg by mouth at bedtime.        . Calcium Carbonate-Vitamin D (CALTRATE 600+D) 600-400 MG-UNIT per chew tablet Chew 1 tablet by mouth daily.       . calcium citrate (CALCITRATE - DOSED IN MG ELEMENTAL CALCIUM) 950 MG tablet Take 1 tablet by mouth  daily.        . cetirizine (ZYRTEC) 10 MG tablet Take 10 mg by mouth every morning.        . clobetasol cream (TEMOVATE) 0.05 % Apply 1 application topically 2 (two) times daily as needed. For dry skin       . desvenlafaxine (PRISTIQ) 50 MG 24 hr tablet Take 50 mg by mouth at bedtime.        Marland Kitchen estradiol (ESTRACE) 2 MG tablet Take 2 mg by mouth daily.        Marland Kitchen gemfibrozil (LOPID) 600 MG tablet Take 600 mg by mouth 2 (two) times daily before a meal.        . hydrOXYzine (ATARAX/VISTARIL) 25 MG tablet Take 25 mg by mouth 1 day or 1 dose.        . ibuprofen (ADVIL,MOTRIN) 200 MG tablet Take 400 mg by mouth every 6 (six) hours as needed. For back pain       . lansoprazole (PREVACID) 30 MG capsule Take 30 mg by mouth daily.        Marland Kitchen losartan-hydrochlorothiazide (HYZAAR) 100-25 MG per tablet Take 1 tablet by mouth daily.        . nisoldipine (SULAR) 25.5 MG 24 hr tablet Take 25.5 mg by mouth every morning.        . ezetimibe (ZETIA) 10 MG  tablet Take 10 mg by mouth at bedtime.        . metFORMIN (GLUCOPHAGE) 500 MG tablet Take 500 mg by mouth 2 (two) times daily with a meal.        . naphazoline-pheniramine (NAPHCON-A) 0.025-0.3 % ophthalmic solution Place 1 drop into both eyes 4 (four) times daily as needed.          Assessment:  Patient is a 61 yr old F s/p R TKA, on coumadin for VTE prophylaxis.  Patient also on gemfibrozil which can affect INR.  Baseline H/H, INR, and platelets wnl.    Goal of Therapy:  INR 2-3   Plan:  1.  Coumadin 5 mg po x1 today 2.  F/u daily INR in am   3.  Coumadin book and video ordered    Darreld Mclean 06/22/2011,2:00 PM

## 2011-06-22 NOTE — Anesthesia Procedure Notes (Addendum)
Anesthesia Regional Block:  Femoral nerve block  Pre-Anesthetic Checklist: ,, timeout performed, Correct Patient, Correct Site, Correct Laterality, Correct Procedure, Correct Position, site marked, Risks and benefits discussed,  Surgical consent,  Pre-op evaluation,  At surgeon's request and post-op pain management  Laterality: Right  Prep: chloraprep       Needles:   Needle Type: Other   (Arrow Echogenic)   Needle Length: 9cm  Needle Gauge: 21    Additional Needles:  Procedures: ultrasound guided Femoral nerve block Narrative:  Start time: 06/22/2011 6:50 AM End time: 06/22/2011 6:56 AM Injection made incrementally with aspirations every 5 mL.  Performed by: Personally  Anesthesiologist: C. Frederick MD  Additional Notes: Ultrasound guidance used to: id relevant anatomy, confirm needle position, local anesthetic spread, avoidance of vascular puncture. Picture saved. No complications. Block performed personally by Janetta Hora. Gelene Mink, MD    Femoral nerve block Procedure Name: Intubation Date/Time: 06/22/2011 7:36 AM Performed by: Fuller Canada Pre-anesthesia Checklist: Timeout performed, Emergency Drugs available, Patient identified, Suction available and Patient being monitored Patient Re-evaluated:Patient Re-evaluated prior to inductionOxygen Delivery Method: Circle System Utilized Preoxygenation: Pre-oxygenation with 100% oxygen Intubation Type: IV induction Ventilation: Mask ventilation without difficulty Grade View: Grade I Tube type: Oral Tube size: 7.5 mm Number of attempts: 1 Airway Equipment and Method: stylet Placement Confirmation: ETT inserted through vocal cords under direct vision,  breath sounds checked- equal and bilateral and positive ETCO2 Secured at: 21 cm Tube secured with: Tape Dental Injury: Teeth and Oropharynx as per pre-operative assessment

## 2011-06-22 NOTE — Transfer of Care (Signed)
Immediate Anesthesia Transfer of Care Note  Patient: Shirley Juarez  Procedure(s) Performed:  TOTAL KNEE ARTHROPLASTY - Right Total Knee Arthroplasty  Patient Location: PACU  Anesthesia Type: General  Level of Consciousness: awake, alert , oriented and patient cooperative  Airway & Oxygen Therapy: Patient Spontanous Breathing and Patient connected to nasal cannula oxygen  Post-op Assessment: Report given to PACU RN, Post -op Vital signs reviewed and stable and Patient moving all extremities  Post vital signs: Reviewed and stable Filed Vitals:   06/22/11 0558  BP: 124/72  Pulse: 68  Temp: 36.7 C  Resp: 18    Complications: No apparent anesthesia complications

## 2011-06-22 NOTE — Brief Op Note (Signed)
06/22/2011  9:16 AM  PATIENT:  Shirley Juarez  61 y.o. female  PRE-OPERATIVE DIAGNOSIS:  right knee OA, end staged  POST-OPERATIVE DIAGNOSIS:  right knee OA, end staged  PROCEDURE:  Procedure(s): TOTAL KNEE ARTHROPLASTY using Zerita Boers Sigma RP knee system  SURGEON:  Surgeon(s): Verlee Rossetti, MD  PHYSICIAN ASSISTANT:   ASSISTANTS: Thea Gist, PA-C   ANESTHESIA:   general  EBL:  Total I/O In: -  Out: 200 [Urine:200]  BLOOD ADMINISTERED:none  DRAINS: none   LOCAL MEDICATIONS USED:  NONE  SPECIMEN:  No Specimen  DISPOSITION OF SPECIMEN:  N/A  COUNTS:  YES  TOURNIQUET:  * Missing tourniquet times found for documented tourniquets in log:  11841 *  DICTATION: .Other Dictation: Dictation Number [id number]  PLAN OF CARE: Admit to inpatient   PATIENT DISPOSITION:  PACU - hemodynamically stable.   Delay start of Pharmacological VTE agent (>24hrs) due to surgical blood loss or risk of bleeding:  {YES/NO/NOT APPLICABLE:20182

## 2011-06-23 LAB — PROTIME-INR: INR: 1.2 (ref 0.00–1.49)

## 2011-06-23 LAB — CBC
HCT: 30.7 % — ABNORMAL LOW (ref 36.0–46.0)
Hemoglobin: 10 g/dL — ABNORMAL LOW (ref 12.0–15.0)
MCV: 83.2 fL (ref 78.0–100.0)
WBC: 9.2 10*3/uL (ref 4.0–10.5)

## 2011-06-23 LAB — BASIC METABOLIC PANEL
BUN: 21 mg/dL (ref 6–23)
Chloride: 94 mEq/L — ABNORMAL LOW (ref 96–112)
Glucose, Bld: 132 mg/dL — ABNORMAL HIGH (ref 70–99)
Potassium: 3.8 mEq/L (ref 3.5–5.1)

## 2011-06-23 LAB — GLUCOSE, CAPILLARY
Glucose-Capillary: 143 mg/dL — ABNORMAL HIGH (ref 70–99)
Glucose-Capillary: 123 mg/dL — ABNORMAL HIGH (ref 70–99)
Glucose-Capillary: 124 mg/dL — ABNORMAL HIGH (ref 70–99)

## 2011-06-23 MED ORDER — WARFARIN SODIUM 5 MG PO TABS
5.0000 mg | ORAL_TABLET | Freq: Once | ORAL | Status: AC
  Administered 2011-06-23: 5 mg via ORAL
  Filled 2011-06-23: qty 1

## 2011-06-23 NOTE — Progress Notes (Signed)
Occupational Therapy Evaluation Patient Details Name: Shirley Juarez MRN: [id number] DOB: 1950/12/20 Today's Date: 06/23/2011  Problem List: There is no problem list on file for this patient.   Past Medical History:  Past Medical History  Diagnosis Date  . Hypertension   . Hyperlipidemia   . Coronary artery disease   . Myocardial infarction   . Anxiety   . Sleep apnea     does not use CPAP- it is broken.  Last sleep study was 7 years ago.  Has not worn in  4  years  . Depression   . GERD (gastroesophageal reflux disease)   . Arthritis     Osteoarthritis- knee and back  . Diabetes mellitus     On oral and diet   Past Surgical History:  Past Surgical History  Procedure Date  . Abdominal hysterectomy   . Knee arthrocentesis     OT Assessment/Plan/Recommendation OT Assessment Clinical Impression Statement: Pt s/p R TKR. Completed all education related to OT. Showed pt's husband tubbench and discussed availability. No furhter OT needed. OT Recommendation/Assessment: Patient does not need any further OT services OT Recommendation Follow Up Recommendations: No OT follow up Equipment Recommended: Tub/shower bench OT Goals Acute Rehab OT Goals OT Goal Formulation:  (eval only)  OT Evaluation Precautions/Restrictions  Precautions Required Braces or Orthoses: Yes Knee Immobilizer: On except when in CPM Restrictions Weight Bearing Restrictions: Yes RLE Weight Bearing: Weight bearing as tolerated Prior Functioning Home Living Lives With: Spouse Receives Help From: Family Type of Home: House Home Layout: One level Home Access: Stairs to enter Entrance Stairs-Rails: None Entrance Stairs-Number of Steps: 1 Bathroom Shower/Tub: Forensic scientist: Handicapped height Bathroom Accessibility: Yes How Accessible: Accessible via walker Home Adaptive Equipment: Walker - rolling;Crutches;Straight cane Prior Function Level of Independence: Independent with  basic ADLs;Independent with homemaking with ambulation;Independent with gait;Independent with transfers Able to Take Stairs?: Yes Driving: Yes Vocation: Full time employment ADL ADL Eating/Feeding: Simulated;Independent Where Assessed - Eating/Feeding: Chair Grooming: Simulated;Independent Where Assessed - Grooming: Sitting, chair Upper Body Bathing: Simulated;Set up Where Assessed - Upper Body Bathing: Sitting, chair Lower Body Bathing: Minimal assistance;Simulated Where Assessed - Lower Body Bathing: Sit to stand from chair Upper Body Dressing: Independent Lower Body Dressing: Moderate assistance;Simulated Where Assessed - Lower Body Dressing: Sit to stand from chair Toilet Transfer: Simulated;Supervision/safety Toileting - Clothing Manipulation: Simulated;Modified independent Where Assessed - Toileting Clothing Manipulation: Standing Toileting - Hygiene: Independent Where Assessed - Toileting Hygiene: Standing Tub/Shower Transfer: Not assessed (discussed with pt's husband/pt) Equipment Used: Rolling walker Ambulation Related to ADLs: supervision ADL Comments: Pt's husband plans to help with all ADLa dn will be home 24/7. Vision/Perception  Vision - History Baseline Vision: No visual deficits Perception Perception: Within Functional Limits Praxis Praxis: Intact Cognition Cognition Arousal/Alertness: Awake/alert Overall Cognitive Status: Appears within functional limits for tasks assessed Orientation Level: Oriented X4 Sensation/Coordination Sensation Light Touch: Appears Intact Stereognosis: Appears Intact Hot/Cold: Appears Intact Proprioception: Appears Intact Coordination Gross Motor Movements are Fluid and Coordinated: Yes Fine Motor Movements are Fluid and Coordinated: Yes Extremity Assessment RUE Assessment RUE Assessment: Within Functional Limits LUE Assessment LUE Assessment: Within Functional Limits Mobility  Bed Mobility Bed Mobility: Yes Supine to  Sit: 4: Min assist;HOB elevated (Comment degrees) Sitting - Scoot to Edge of Bed: 5: Supervision Transfers Transfers: Yes Sit to Stand: 4: Min assist Stand to Sit: 4: Min assist;With upper extremity assist;To chair/3-in-1 Exercises   End of Session OT - End of Session Equipment Utilized During Treatment:  Gait belt;Right knee immobilizer Activity Tolerance: Patient limited by fatigue Patient left: in chair;with call bell in reach;with family/visitor present Nurse Communication: Mobility status for transfers General Behavior During Session: [facility] for tasks performed Cognition: [facility] for tasks performed   [facility] 06/23/2011, 2:41 PM  [facility], OTR/L  [phone] 06/23/2011

## 2011-06-23 NOTE — Progress Notes (Signed)
Physical Therapy Evaluation Patient Details Name: Shirley Juarez MRN: [id number] DOB: [birth year] Today's Date: 06/23/2011  Problem List: There is no problem list on file for this patient.   Past Medical History:  Past Medical History  Diagnosis Date  . Hypertension   . Hyperlipidemia   . Coronary artery disease   . Myocardial infarction   . Anxiety   . Sleep apnea     does not use CPAP- it is broken.  Last sleep study was 7 years ago.  Has not worn in  4  years  . Depression   . GERD (gastroesophageal reflux disease)   . Arthritis     Osteoarthritis- knee and back  . Diabetes mellitus     On oral and diet   Past Surgical History:  Past Surgical History  Procedure Date  . Abdominal hysterectomy   . Knee arthrocentesis     PT Assessment/Plan/Recommendation PT Assessment Clinical Impression Statement: Pt presents with a medical diagnosis of Right TKA along with the following impairments/deficits and therapy diagnosis listed below. Pt will benefit from skilled PT in the acute care setting in order to maximize functional mobility for a safe d/c home PT Recommendation/Assessment: Patient will need skilled PT in the acute care venue PT Problem List: Decreased strength;Decreased range of motion;Decreased activity tolerance;Decreased mobility;Decreased knowledge of use of DME;Decreased knowledge of precautions;Pain PT Therapy Diagnosis : Difficulty walking;Acute pain PT Plan PT Frequency: 7X/week PT Treatment/Interventions: DME instruction;Gait training;Stair training;Functional mobility training;Therapeutic activities;Therapeutic exercise;Patient/family education PT Recommendation Follow Up Recommendations: Home health PT;Supervision/Assistance - 24 hour Equipment Recommended: None recommended by PT PT Goals  Acute Rehab PT Goals PT Goal Formulation: With patient/family Time For Goal Achievement: 7 days Pt will go Supine/Side to Sit: with modified independence PT Goal:  Supine/Side to Sit - Progress: Goal set today Pt will go Sit to Supine/Side: with modified independence PT Goal: Sit to Supine/Side - Progress: Goal set today Pt will go Sit to Stand: with modified independence PT Goal: Sit to Stand - Progress: Goal set today Pt will go Stand to Sit: with modified independence PT Goal: Stand to Sit - Progress: Goal set today Pt will Transfer Bed to Chair/Chair to Bed: with supervision PT Transfer Goal: Bed to Chair/Chair to Bed - Progress: Goal set today Pt will Ambulate: >150 feet;with supervision;with rolling walker PT Goal: Ambulate - Progress: Goal set today Pt will Go Up / Down Stairs: 1-2 stairs;with min assist;with rolling walker PT Goal: Up/Down Stairs - Progress: Goal set today  PT Evaluation Precautions/Restrictions  Precautions Required Braces or Orthoses: Yes Knee Immobilizer: On except when in CPM Restrictions Weight Bearing Restrictions: Yes RLE Weight Bearing: Weight bearing as tolerated Prior Functioning  Home Living Lives With: Spouse Receives Help From: Family Type of Home: House Home Layout: One level Home Access: Stairs to enter Entrance Stairs-Rails: None Entrance Stairs-Number of Steps: 1 Bathroom Shower/Tub: Forensic scientist: Handicapped height Bathroom Accessibility: Yes How Accessible: Accessible via walker Home Adaptive Equipment: Walker - rolling;Crutches;Straight cane Prior Function Level of Independence: Independent with basic ADLs;Independent with homemaking with ambulation;Independent with gait;Independent with transfers Able to Take Stairs?: Yes Driving: Yes Vocation: Full time employment Cognition Cognition Arousal/Alertness: Awake/alert Overall Cognitive Status: Appears within functional limits for tasks assessed Orientation Level: Oriented X4 Sensation/Coordination Sensation Light Touch: Appears Intact Extremity Assessment RLE Assessment RLE Assessment: Exceptions to [facility] RLE  AROM (degrees) Overall AROM Right Lower Extremity: Deficits;Due to pain RLE Overall AROM Comments: Hip and Ankle WFL; Knee 0-50 degrees  RLE Strength RLE Overall Strength: Deficits;Due to pain RLE Overall Strength Comments: Hip and Ankle WFL; Pt with strong quad contraction LLE Assessment LLE Assessment: Within Functional Limits Mobility (including Balance) Bed Mobility Bed Mobility: Yes Supine to Sit: 3: Mod assist;HOB elevated (Comment degrees);With rails (30) Supine to Sit Details (indicate cue type and reason): VC for sequencing and hand placement. Assist with RLE as well as minimal trunk control Sitting - Scoot to Edge of Bed: 5: Supervision Sitting - Scoot to Edge of Bed Details (indicate cue type and reason): VC for weight shifting Transfers Transfers: Yes Sit to Stand: 3: Mod assist;With upper extremity assist;From bed Sit to Stand Details (indicate cue type and reason): VC for hand placement for safety. Assist with forward translation for ease into standing. Stand to Sit: 4: Min assist;With upper extremity assist;To chair/3-in-1 Stand to Sit Details: VC for hand placement. Pt with uncontrolled descent into chair Ambulation/Gait Ambulation/Gait: Yes Ambulation/Gait Assistance: 4: Min assist Ambulation/Gait Assistance Details (indicate cue type and reason): VC for proper sequencing and technique as well as safety with RW distance. Distance limited by fatigue and Shortness of breath Ambulation Distance (Feet): 5 Feet Assistive device: Rolling walker Gait Pattern: Step-to pattern;Decreased step length - left;Decreased stance time - right;Decreased hip/knee flexion - right;Trunk flexed Gait velocity: Decreased gait speed Stairs: No    Exercise  Total Joint Exercises Ankle Circles/Pumps: AROM;Strengthening;Both;10 reps;Supine Quad Sets: AROM;Strengthening;Right;10 reps;Supine Heel Slides: AAROM;Strengthening;Right;10 reps;Supine End of Session PT - End of Session Equipment  Utilized During Treatment: Gait belt;Right knee immobilizer Activity Tolerance: Patient limited by fatigue;Patient limited by pain Patient left: in chair;with call bell in reach;with family/visitor present Nurse Communication: Mobility status for transfers;Mobility status for ambulation General Behavior During Session: [facility] for tasks performed Cognition: [facility] for tasks performed  Milana Kidney 06/23/2011, 1:14 PM  06/23/2011 Milana Kidney DPT PAGER: [phone] OFFICE: [phone]

## 2011-06-23 NOTE — Op Note (Signed)
NAMEDASHIA, CALDEIRA                  ACCOUNT NO.:  [id number]  MEDICAL RECORD NO.:  [id number]  LOCATION:  [facility]                         FACILITY:  [facility]  PHYSICIAN:  Almedia Balls. Ranell Patrick, M.D. DATE OF BIRTH:  05-30-1951  DATE OF PROCEDURE:  06/22/2011 DATE OF DISCHARGE:                              OPERATIVE REPORT   PREOPERATIVE DIAGNOSIS:  Right knee end-stage osteoarthritis.  POSTOPERATIVE DIAGNOSIS:  Right knee end-stage osteoarthritis.  PROCEDURE PERFORMED:  Right total knee replacement using DePuy Sigma rotating platform prosthesis.  ATTENDING SURGEON:  Almedia Balls. Ranell Patrick, MD  ASSISTANT:  Donnie Coffin. Dixon, PA, who scrubbed the entire procedure, necessary for satisfactory completion of procedure and adequate exposure and retraction.  General anesthesia was used plus femoral block.  ESTIMATED BLOOD LOSS:  Minimal.  FLUID REPLACEMENT:  1500 mL crystalloid.  COUNTS:  Correct.  No complications.  Perioperative antibiotics were given.  INDICATIONS:  The patient is a 61 year old female with end-stage arthritis of her right knee.  She has bone-on-bone arthritis and requires occasional assistance with a cane for comfort.  The patient has night pain and rest pain.  The patient has failed conservative management consisting of injections, modification of activity, anti- inflammatories, and presents for operative treatment.  She has already had a successful left total knee replacement, has done well with that. Informed consent obtained.  DESCRIPTION OF PROCEDURE:  After adequate level of anesthesia was achieved, the patient was positioned supine on the operating room table. Right leg was correctly identified and a nonsterile tourniquet was placed on the proximal thigh.  Left leg was padded and secured to the operating room table.  After sterile prep and drape of the right knee, we called a time-out, we then exsanguinated the limb using Esmarch bandage.  We elevated the tourniquet  to 300 mmHg.  A longitudinal knee incision was created with the knee in flexion.  Dissection down through subcu tissues using the 10 blade scalpel.  We used a fresh 10 blade to perform a medial parapatellar arthrotomy and we everted the patella, divided the lateral patellofemoral ligaments.  We then entered into the distal femur using a step-cut drill.  We placed a distal femoral resection guide intramedullary resecting with 5-degrees right, 10 mm off the distal femur.  We then sized the femur to a size 3 anterior down and then placed our 4-in-1 distal femoral block to perform anterior, posterior, and chamfer cuts.  We then resected ACL, PCL, and meniscal tissue.  We subluxed the tibia anteriorly, placed our retractors, and then went ahead and resected the tibia at 90 degrees perpendicular long axis of the tibia with minimal posterior slope with posterior cruciate substituting prosthesis.  We then removed excess posterior bone off the posterior femur and a capsular release.  We then went ahead and checked our flexion and extension gaps, which were symmetric at 10 mm.  Then, we went ahead and finished our tibial resection with the modular drill and keel punch, placing our tibial trial and then went ahead and placed our box cut on the femur and cut that and placed our trial right size 3 femur in place.  We reduced with a 10  insert and were happy with the alignment and full extension and stability in flexion and extension.  We then resurfaced the patella going from 22 mm thickness down to 15 mm and placing a size 32 patellar button.  We had excellent patellar tracking with no hands technique.  We then removed all trial components, pulse irrigated all bone surfaces, and then cemented the components into place with vacuum mixing.  Once the cement was hardened and the knee was in extension with the 10-mm insert in, we went ahead and removed all the excess cement with quarter-inch curved  osteotome.  We then trialed with a 12.5 and could not get into full extension and slightly better in flexion stability, so we selected the 12.5 insert.  With a real 12.5 insert inserted, we reduced the knee and were happy again with soft tissue balance in flexion and extension and normal patellar tracking. We then thoroughly pulse irrigated the knee, closed the medial parapatellar arthrotomy with interrupted #1 Vicryl suture, followed 0 Vicryl and 2-0 Vicryl layered subcutaneous closure, and 4-0 Monocryl for skin.  Steri-Strips applied, followed by sterile dressing.  The patient tolerated the procedure well.     Almedia Balls. Ranell Patrick, M.D.     SRN/MEDQ  D:  06/22/2011  T:  06/22/2011  Job:  [id number]

## 2011-06-23 NOTE — Progress Notes (Signed)
ANTICOAGULATION CONSULT NOTE - Follow Up Consult  Pharmacy Consult for Coumadin Indication: VTE prophylaxis  Allergies  Allergen Reactions  . Latex Rash  . Codeine Nausea And Vomiting  . Crestor (Rosuvastatin Calcium) Nausea And Vomiting  . Fish Oil Nausea And Vomiting  . Lipitor (Atorvastatin Calcium) Nausea And Vomiting  . Peanut-Containing Drug Products Hives  . Welchol (Colesevelam Hcl) Nausea And Vomiting  . Zithromax (Azithromycin) Nausea And Vomiting    Patient Measurements:     Vital Signs: Temp: 99 F (37.2 C) (01/19 0555) BP: 116/81 mmHg (01/19 0555) Pulse Rate: 96  (01/19 0555)  Labs:  Basename 06/23/11 0600 06/22/11 1400  HGB 10.0* --  HCT 30.7* --  PLT 323 --  APTT -- --  LABPROT 15.5* 14.2  INR 1.20 1.08  HEPARINUNFRC -- --  CREATININE 0.94 --  CKTOTAL -- --  CKMB -- --  TROPONINI -- --   CrCl is unknown because there is no height on file for the current visit.   Medications:  Scheduled:    . ALPRAZolam  0.5 mg Oral Q0700  . aspirin EC  81 mg Oral QHS  . calcium citrate  1 tablet Oral Daily  . calcium-vitamin D  1 tablet Oral Daily  .  ceFAZolin (ANCEF) IV  1 g Intravenous Q6H  . desvenlafaxine  50 mg Oral QHS  . estradiol  2 mg Oral Daily  . ezetimibe  10 mg Oral QHS  . ferrous sulfate  325 mg Oral TID PC  . gemfibrozil  600 mg Oral BID AC  . hydrochlorothiazide  25 mg Oral Daily  . hydrOXYzine  25 mg Oral Daily  . loratadine  10 mg Oral Daily  . losartan  100 mg Oral Daily  . metFORMIN  500 mg Oral BID WC  . nisoldipine  25.5 mg Oral Q0700  . pantoprazole  40 mg Oral Q1200  . patient's guide to using coumadin book   Does not apply Once  . warfarin  5 mg Oral ONCE-1800  . warfarin   Does not apply Once  . DISCONTD: Calcium Carbonate-Vitamin D  1 tablet Oral Daily  . DISCONTD: chlorhexidine  60 mL Topical Once  . DISCONTD: HYDROmorphone PCA 0.3 mg/mL   Intravenous Q4H  . DISCONTD: losartan-hydrochlorothiazide  1 tablet Oral Daily     Assessment: Pt s/p elective rt TKA on 1/18. INR rising to goal. No bleeding noted.   Goal of Therapy:  INR 2-3   Plan:  1) Continue Coumadin 5mg  2) Daily PT/INR  Elson Clan 06/23/2011,10:44 AM

## 2011-06-23 NOTE — Progress Notes (Signed)
Physical Therapy Treatment Patient Details Name: Shirley Juarez MRN: [id number] DOB: 04/07/1951 Today's Date: 06/23/2011  PT Assessment/Plan  PT - Assessment/Plan Comments on Treatment Session: Pt fatigued and nauseus this afternoon and did not want to ambulate following going to the bathroom. Therex completed and pt placed in CPM instead. PT Plan: Discharge plan remains appropriate;Frequency remains appropriate PT Frequency: 7X/week Follow Up Recommendations: Home health PT;Supervision/Assistance - 24 hour Equipment Recommended: Tub/shower bench;None recommended by PT PT Goals  Acute Rehab PT Goals PT Goal Formulation: With patient/family PT Goal: Supine/Side to Sit - Progress: Progressing toward goal PT Goal: Sit to Supine/Side - Progress: Progressing toward goal PT Goal: Sit to Stand - Progress: Progressing toward goal PT Goal: Stand to Sit - Progress: Progressing toward goal PT Transfer Goal: Bed to Chair/Chair to Bed - Progress: Progressing toward goal PT Goal: Ambulate - Progress: Not progressing PT Goal: Up/Down Stairs - Progress: Not progressing  PT Treatment Precautions/Restrictions  Precautions Required Braces or Orthoses: Yes Knee Immobilizer: On except when in CPM Restrictions Weight Bearing Restrictions: Yes RLE Weight Bearing: Weight bearing as tolerated Mobility (including Balance) Bed Mobility Bed Mobility: Yes Supine to Sit: 4: Min assist;HOB elevated (Comment degrees) Sitting - Scoot to Edge of Bed: 5: Supervision Sit to Supine: 4: Min assist Sit to Supine - Details (indicate cue type and reason): Min assist with RLE into bed. VC for hand placement. Transfers Transfers: Yes Sit to Stand: 4: Min assist;From chair/3-in-1;With upper extremity assist Sit to Stand Details (indicate cue type and reason): VC for hand placement and safety with RW Stand to Sit: 4: Min assist;To bed;With upper extremity assist Stand to Sit Details: VC for hand placement Stand Pivot  Transfers: 4: Min assist Stand Pivot Transfer Details (indicate cue type and reason): Min assist from 3-1 to bed with RW Ambulation/Gait Ambulation/Gait: No (pt refused)    Exercise  Total Joint Exercises Heel Slides: AAROM;Strengthening;Right;10 reps;Supine Hip ABduction/ADduction: AAROM;Strengthening;Right;10 reps;Supine Straight Leg Raises: AAROM;Strengthening;Right;10 reps;Supine End of Session PT - End of Session Equipment Utilized During Treatment: Gait belt;Right knee immobilizer Activity Tolerance: Patient limited by pain;Patient limited by fatigue Patient left: in bed;in CPM;with call bell in reach;with family/visitor present Nurse Communication: Mobility status for transfers;Mobility status for ambulation General Behavior During Session: [facility] for tasks performed Cognition: [facility] for tasks performed  Milana Kidney 06/23/2011, 3:27 PM

## 2011-06-23 NOTE — Progress Notes (Signed)
Subjective: 1 Day Post-Op Procedure(s) (LRB): TOTAL KNEE ARTHROPLASTY (Right) Patient reports pain as 5 on 0-10 scale.   Denies CP or SOB.  Voiding without difficulty. Positive flatus. Having some double vision likely related to meds Objective: Vital signs in last 24 hours: Temp:  [97.6 F (36.4 C)-99 F (37.2 C)] 99 F (37.2 C) (01/19 0555) Pulse Rate:  [68-96] 96  (01/19 0555) Resp:  [10-30] 18  (01/19 0555) BP: (110-141)/(62-83) 116/81 mmHg (01/19 0555) SpO2:  [93 %-96 %] 95 % (01/19 0555) FiO2 (%):  [0 %] 0 % (01/18 1130)  Intake/Output from previous day: 01/18 0701 - 01/19 0700 In: 2195.8 [I.V.:2095.8; IV Piggyback:100] Out: 1100 [Urine:1100] Intake/Output this shift:     Basename 06/23/11 0600  HGB 10.0*    Basename 06/23/11 0600  WBC 9.2  RBC 3.69*  HCT 30.7*  PLT 323    Basename 06/23/11 0600  NA 137  K 3.8  CL 94*  CO2 28  BUN 21  CREATININE 0.94  GLUCOSE 132*  CALCIUM 9.2    Basename 06/23/11 0600 06/22/11 1400  LABPT -- --  INR 1.20 1.08    Neurologically intact Neurovascular intact Sensation intact distally Dorsiflexion/Plantar flexion intact Incision: dressing C/D/I Compartment soft  Assessment/Plan: 1 Day Post-Op Procedure(s) (LRB): TOTAL KNEE ARTHROPLASTY (Right) Advance diet Up with therapy Will wean from pca start po meds today Dressing change in am  Shirley Dolph R. 06/23/2011, 9:42 AM

## 2011-06-24 LAB — CBC
MCH: 27.3 pg (ref 26.0–34.0)
MCHC: 33.6 g/dL (ref 30.0–36.0)
MCV: 81.3 fL (ref 78.0–100.0)
Platelets: 289 10*3/uL (ref 150–400)
RBC: 3.59 MIL/uL — ABNORMAL LOW (ref 3.87–5.11)
RDW: 13.8 % (ref 11.5–15.5)

## 2011-06-24 MED ORDER — VENLAFAXINE HCL ER 75 MG PO CP24
75.0000 mg | ORAL_CAPSULE | Freq: Every day | ORAL | Status: DC
  Administered 2011-06-24: 75 mg via ORAL
  Filled 2011-06-24 (×2): qty 1

## 2011-06-24 MED ORDER — WARFARIN SODIUM 5 MG PO TABS
5.0000 mg | ORAL_TABLET | Freq: Once | ORAL | Status: AC
  Administered 2011-06-24: 5 mg via ORAL
  Filled 2011-06-24: qty 1

## 2011-06-24 NOTE — Progress Notes (Signed)
Patient ID: Shirley Juarez, female   DOB: Jun 24, 1950, 61 y.o.   MRN: [id number] PO day 2.  Hgb 9.8.  Max te 99.2.  NV intact rt foot.

## 2011-06-24 NOTE — Progress Notes (Signed)
Physical Therapy Treatment Patient Details Name: Shirley Juarez MRN: [id number] DOB: Aug 31, 1950 Today's Date: 06/24/2011  PT Assessment/Plan  PT - Assessment/Plan Comments on Treatment Session: Pt feeling much better today and was able to ambulate a good distance in the hallway. Pt still exhibits decreased quad strength during activity, although she is able to maintain stability functionally. Will continue with strengthening and mobility, attempt stairs this afternoon PT Plan: Discharge plan remains appropriate;Frequency remains appropriate PT Frequency: 7X/week Follow Up Recommendations: Home health PT;Supervision/Assistance - 24 hour Equipment Recommended: Tub/shower bench;None recommended by PT PT Goals  Acute Rehab PT Goals PT Goal Formulation: With patient/family PT Goal: Supine/Side to Sit - Progress: Progressing toward goal PT Goal: Sit to Supine/Side - Progress: Progressing toward goal PT Goal: Sit to Stand - Progress: Progressing toward goal PT Goal: Stand to Sit - Progress: Progressing toward goal PT Transfer Goal: Bed to Chair/Chair to Bed - Progress: Progressing toward goal PT Goal: Ambulate - Progress: Progressing toward goal PT Goal: Up/Down Stairs - Progress: Not progressing  PT Treatment Precautions/Restrictions  Precautions Required Braces or Orthoses: Yes Knee Immobilizer: On except when in CPM Restrictions Weight Bearing Restrictions: Yes RLE Weight Bearing: Weight bearing as tolerated Mobility (including Balance) Bed Mobility Bed Mobility: Yes Supine to Sit: 4: Min assist;With rails Supine to Sit Details (indicate cue type and reason): Min assist with RLE. Pt able to control trunk and LLE without physical assist. Cueing for technique Sitting - Scoot to Edge of Bed: 6: Modified independent (Device/Increase time) Sit to Supine: 4: Min assist Sit to Supine - Details (indicate cue type and reason): Pt able to complete first 50% of transfer without physical assist but  required assist for last half with RLE support into bed. Scooting to [facility]: 5: Supervision;With rail Scooting to [facility] Details (indicate cue type and reason): VC for proper hand placement. No physical assist needed Transfers Transfers: Yes Sit to Stand: 4: Min assist;From bed;With upper extremity assist Sit to Stand Details (indicate cue type and reason): VC for safety and hand placement Stand to Sit: 4: Min assist Stand to Sit Details: VC for safety and hand placement Ambulation/Gait Ambulation/Gait: Yes Ambulation/Gait Assistance: 4: Min assist (Minguard assist) Ambulation/Gait Assistance Details (indicate cue type and reason): VC for safety with distance of RW as well as for even step length. Ambulation Distance (Feet): 100 Feet Assistive device: Rolling walker Gait Pattern: Step-to pattern;Decreased step length - left;Decreased stance time - right;Decreased hip/knee flexion - right;Trunk flexed Gait velocity: Decreased gait speed Stairs: No (will attempt this afternoon)    Exercise  Total Joint Exercises Heel Slides: AROM;Strengthening;Right;10 reps;Seated (pt able to achieve ~90 degrees knee flexion) Long Arc Quad: Strengthening;10 reps;Right;Seated;AAROM (pt unable to complete without assist of weight of leg) End of Session PT - End of Session Equipment Utilized During Treatment: Gait belt;Right knee immobilizer Activity Tolerance: Patient tolerated treatment well Patient left: with call bell in reach;with family/visitor present;in chair Nurse Communication: Mobility status for transfers;Mobility status for ambulation General Behavior During Session: [facility] for tasks performed Cognition: [facility] for tasks performed  Milana Kidney 06/24/2011, 10:49 AM  06/24/2011 Milana Kidney DPT PAGER: [phone] OFFICE: [phone]

## 2011-06-24 NOTE — Progress Notes (Signed)
ANTICOAGULATION CONSULT NOTE - Follow Up Consult  Pharmacy Consult for Coumadin Indication: VTE prophylaxis  Allergies  Allergen Reactions  . Latex Rash  . Codeine Nausea And Vomiting  . Crestor (Rosuvastatin Calcium) Nausea And Vomiting  . Fish Oil Nausea And Vomiting  . Lipitor (Atorvastatin Calcium) Nausea And Vomiting  . Peanut-Containing Drug Products Hives  . Welchol (Colesevelam Hcl) Nausea And Vomiting  . Zithromax (Azithromycin) Nausea And Vomiting    Patient Measurements:     Vital Signs: Temp: 98.9 F (37.2 C) (01/20 0640) BP: 148/75 mmHg (01/20 0640) Pulse Rate: 86  (01/20 0640)  Labs:  Basename 06/24/11 0625 06/23/11 0600 06/22/11 1400  HGB 9.8* 10.0* --  HCT 29.2* 30.7* --  PLT 289 323 --  APTT -- -- --  LABPROT 17.9* 15.5* 14.2  INR 1.45 1.20 1.08  HEPARINUNFRC -- -- --  CREATININE -- 0.94 --  CKTOTAL -- -- --  CKMB -- -- --  TROPONINI -- -- --   CrCl is unknown because there is no height on file for the current visit.   Medications:  Scheduled:     . ALPRAZolam  0.5 mg Oral Q0700  . aspirin EC  81 mg Oral QHS  . calcium citrate  1 tablet Oral Daily  . calcium-vitamin D  1 tablet Oral Daily  . desvenlafaxine  50 mg Oral QHS  . estradiol  2 mg Oral Daily  . ezetimibe  10 mg Oral QHS  . ferrous sulfate  325 mg Oral TID PC  . gemfibrozil  600 mg Oral BID AC  . hydrochlorothiazide  25 mg Oral Daily  . hydrOXYzine  25 mg Oral Daily  . loratadine  10 mg Oral Daily  . losartan  100 mg Oral Daily  . metFORMIN  500 mg Oral BID WC  . nisoldipine  25.5 mg Oral Q0700  . pantoprazole  40 mg Oral Q1200  . warfarin  5 mg Oral ONCE-1800  . warfarin   Does not apply Once    Assessment: Pt s/p elective rt TKA on 1/18. INR rising to goal. No bleeding noted.   Goal of Therapy:  INR 2-3   Plan:  1) Continue Coumadin 5mg  2) Daily PT/INR  Cass Vandermeulen, Mercy Riding 06/24/2011,10:10 AM

## 2011-06-24 NOTE — Progress Notes (Signed)
Physical Therapy Treatment Patient Details Name: Shirley Juarez MRN: [id number] DOB: [birth year] Today's Date: 06/24/2011  PT Assessment/Plan  PT - Assessment/Plan Comments on Treatment Session: PT PT Plan: Discharge plan remains appropriate;Frequency remains appropriate PT Frequency: 7X/week Follow Up Recommendations: Home health PT;Supervision/Assistance - 24 hour Equipment Recommended: Tub/shower bench;None recommended by PT PT Goals  Acute Rehab PT Goals PT Goal Formulation: With patient/family PT Goal: Supine/Side to Sit - Progress: Progressing toward goal PT Goal: Sit to Supine/Side - Progress: Progressing toward goal PT Goal: Sit to Stand - Progress: Progressing toward goal PT Goal: Stand to Sit - Progress: Progressing toward goal PT Transfer Goal: Bed to Chair/Chair to Bed - Progress: Progressing toward goal PT Goal: Ambulate - Progress: Progressing toward goal PT Goal: Up/Down Stairs - Progress: Met  PT Treatment Precautions/Restrictions  Precautions Required Braces or Orthoses: Yes Knee Immobilizer: On except when in CPM Restrictions Weight Bearing Restrictions: Yes RLE Weight Bearing: Weight bearing as tolerated Mobility (including Balance) Bed Mobility Bed Mobility: Yes Supine to Sit: 5: Supervision Sitting - Scoot to Edge of Bed: 6: Modified independent (Device/Increase time) Sit to Supine: 4: Min assist Sit to Supine - Details (indicate cue type and reason): Pt able to complete first 50% of transfer without physical assist but required assist for last half with RLE support into bed. Transfers Transfers: Yes Sit to Stand: 5: Supervision;With upper extremity assist;From bed Sit to Stand Details (indicate cue type and reason): VC for hand placement. Pt still tempted to stand with RW, educated on importance to prevent falling Stand to Sit: 5: Supervision;With upper extremity assist;To bed Stand to Sit Details: VC for hand placement Ambulation/Gait Ambulation/Gait:  Yes Ambulation/Gait Assistance: 5: Supervision (Supervision/Min guard) Ambulation/Gait Assistance Details (indicate cue type and reason): VC throughout for increased heel strike and even step length. Pt able to ambulate with a step through gait pattern without any safety concerns Ambulation Distance (Feet): 200 Feet Assistive device: Rolling walker Gait Pattern: Step-to pattern;Decreased step length - left;Decreased stance time - right;Decreased hip/knee flexion - right;Trunk flexed Gait velocity: Decreased gait speed Stairs: Yes Stairs Assistance: 4: Min assist Stairs Assistance Details (indicate cue type and reason): VC for proper stair sequencing and safety with RW Stair Management Technique: No rails;Backwards;With walker Number of Stairs: 1     Exercise    End of Session PT - End of Session Equipment Utilized During Treatment: Gait belt;Right knee immobilizer Activity Tolerance: Patient tolerated treatment well Patient left: with call bell in reach;with family/visitor present;in chair Nurse Communication: Mobility status for transfers;Mobility status for ambulation General Behavior During Session: [facility] for tasks performed Cognition: [facility] for tasks performed  Milana Kidney 06/24/2011, 4:59 PM  06/24/2011 Milana Kidney DPT PAGER: [phone] OFFICE: [phone]

## 2011-06-25 LAB — CBC
HCT: 29.5 % — ABNORMAL LOW (ref 36.0–46.0)
Hemoglobin: 9.8 g/dL — ABNORMAL LOW (ref 12.0–15.0)
MCHC: 33.2 g/dL (ref 30.0–36.0)

## 2011-06-25 LAB — PROTIME-INR: INR: 1.68 — ABNORMAL HIGH (ref 0.00–1.49)

## 2011-06-25 MED ORDER — HYDROMORPHONE 0.3 MG/ML IV SOLN: INTRAVENOUS | Status: DC

## 2011-06-25 NOTE — Progress Notes (Signed)
   CARE MANAGEMENT NOTE 06/25/2011  Patient:  Shirley Juarez, Shirley Juarez   Account Number:  [id number]  Date Initiated:  06/25/2011  Documentation initiated by:  Melinna Linarez  Subjective/Objective Assessment:   Order for [facility] and DME     Action/Plan:   Met with pt who has rolling walker and elevated commode seat, has [facility] for [facility] and Dr Ranell Patrick contacted to order CPM.   Anticipated DC Date:  06/25/2011   Anticipated DC Plan:  HOME W HOME HEALTH SERVICES         Choice offered to / List presented to:  C-1 Patient   DME arranged  CPM      DME agency  [employer]     HH arranged  HH-1 RN  HH-2 PT  HH-3 OT      Status of service:  Completed, signed off Medicare Important Message given?   (If response is "NO", the following Medicare IM given date fields will be blank) Date Medicare IM given:   Date Additional Medicare IM given:    Discharge Disposition:  HOME W HOME HEALTH SERVICES  Per UR Regulation:    Comments:

## 2011-06-25 NOTE — Progress Notes (Signed)
Orthopedics Progress Note  Subjective: Doing well with therapy.  Walked 200 feet.  Wants to go home.  Objective:  Filed Vitals:   06/25/11 0540  BP: 153/74  Pulse: 85  Temp: 98.1 F (36.7 C)  Resp: 18    General: Awake and alert  Musculoskeletal:Wound CDI, NVI, no cords, almost able to fully extend in bed. Neurovascularly intact  Lab Results  Component Value Date   WBC 7.6 06/25/2011   HGB 9.8* 06/25/2011   HCT 29.5* 06/25/2011   MCV 81.9 06/25/2011   PLT 331 06/25/2011       Component Value Date/Time   NA 137 06/23/2011 0600   K 3.8 06/23/2011 0600   CL 94* 06/23/2011 0600   CO2 28 06/23/2011 0600   GLUCOSE 132* 06/23/2011 0600   BUN 21 06/23/2011 0600   CREATININE 0.94 06/23/2011 0600   CALCIUM 9.2 06/23/2011 0600   GFRNONAA 64* 06/23/2011 0600   GFRAA 74* 06/23/2011 0600    Lab Results  Component Value Date   INR 1.68* 06/25/2011   INR 1.45 06/24/2011   INR 1.20 06/23/2011    Assessment/Plan: POD #3 s/p Procedure(s): TOTAL KNEE ARTHROPLASTY D/C home Home health PT,OT, RN for coumadin F/u 2 weeks  Almedia Balls. Ranell Patrick, MD 06/25/2011 8:27 AM

## 2011-06-25 NOTE — Discharge Summary (Signed)
Physician Discharge Summary  Patient ID: TIARRAH SAVILLE MRN: [id number] DOB/AGE: 06-23-1950 61 y.o.  Admit date: 06/22/2011 Discharge date: 06/25/2011  Admission Diagnoses:  Right knee OA, end stage  Discharge Diagnoses:  Same   Surgeries: Procedure(s): TOTAL KNEE ARTHROPLASTY on 06/22/2011   Consultants: PT, OT, D/C planning  Discharged Condition: Stable  Hospital Course: GENIVA LOHNES is an 61 y.o. female who was admitted 06/22/2011 with a chief complaint of knee pain and loss of function, and found to have a diagnosis of end stage knee arthritis.  They were brought to the operating room on 06/22/2011 and underwent the above named procedures.    The patient had an uncomplicated hospital course and was stable for discharge.  Recent vital signs:  Filed Vitals:   06/25/11 0540  BP: 153/74  Pulse: 85  Temp: 98.1 F (36.7 C)  Resp: 18    Recent laboratory studies:  Results for orders placed during the hospital encounter of 06/22/11  GLUCOSE, CAPILLARY      Component Value Range   Glucose-Capillary 143 (*) 70 - 99 (mg/dL)  GLUCOSE, CAPILLARY      Component Value Range   Glucose-Capillary 132 (*) 70 - 99 (mg/dL)   Comment 1 Documented in Chart     Comment 2 Notify RN    PROTIME-INR      Component Value Range   Prothrombin Time 14.2  11.6 - 15.2 (seconds)   INR 1.08  0.00 - 1.49   PROTIME-INR      Component Value Range   Prothrombin Time 15.5 (*) 11.6 - 15.2 (seconds)   INR 1.20  0.00 - 1.49   CBC      Component Value Range   WBC 9.2  4.0 - 10.5 (K/uL)   RBC 3.69 (*) 3.87 - 5.11 (MIL/uL)   Hemoglobin 10.0 (*) 12.0 - 15.0 (g/dL)   HCT 11.9 (*) 14.7 - 46.0 (%)   MCV 83.2  78.0 - 100.0 (fL)   MCH 27.1  26.0 - 34.0 (pg)   MCHC 32.6  30.0 - 36.0 (g/dL)   RDW 82.9  56.2 - 13.0 (%)   Platelets 323  150 - 400 (K/uL)  BASIC METABOLIC PANEL      Component Value Range   Sodium 137  135 - 145 (mEq/L)   Potassium 3.8  3.5 - 5.1 (mEq/L)   Chloride 94 (*) 96 - 112 (mEq/L)   CO2 28  19 - 32 (mEq/L)   Glucose, Bld 132 (*) 70 - 99 (mg/dL)   BUN 21  6 - 23 (mg/dL)   Creatinine, Ser 8.65  0.50 - 1.10 (mg/dL)   Calcium 9.2  8.4 - 78.4 (mg/dL)   GFR calc non Af Amer 64 (*) >90 (mL/min)   GFR calc Af Amer 74 (*) >90 (mL/min)  GLUCOSE, CAPILLARY      Component Value Range   Glucose-Capillary 144 (*) 70 - 99 (mg/dL)  GLUCOSE, CAPILLARY      Component Value Range   Glucose-Capillary 143 (*) 70 - 99 (mg/dL)  GLUCOSE, CAPILLARY      Component Value Range   Glucose-Capillary 143 (*) 70 - 99 (mg/dL)  GLUCOSE, CAPILLARY      Component Value Range   Glucose-Capillary 123 (*) 70 - 99 (mg/dL)   Comment 1 Notify RN    GLUCOSE, CAPILLARY      Component Value Range   Glucose-Capillary 124 (*) 70 - 99 (mg/dL)   Comment 1 Notify RN    [employer]  Component Value Range   Prothrombin Time 17.9 (*) 11.6 - 15.2 (seconds)   INR 1.45  0.00 - 1.49   CBC      Component Value Range   WBC 8.7  4.0 - 10.5 (K/uL)   RBC 3.59 (*) 3.87 - 5.11 (MIL/uL)   Hemoglobin 9.8 (*) 12.0 - 15.0 (g/dL)   HCT 16.1 (*) 09.6 - 46.0 (%)   MCV 81.3  78.0 - 100.0 (fL)   MCH 27.3  26.0 - 34.0 (pg)   MCHC 33.6  30.0 - 36.0 (g/dL)   RDW 04.5  40.9 - 81.1 (%)   Platelets 289  150 - 400 (K/uL)  GLUCOSE, CAPILLARY      Component Value Range   Glucose-Capillary 143 (*) 70 - 99 (mg/dL)   Comment 1 Notify RN    PROTIME-INR      Component Value Range   Prothrombin Time 20.1 (*) 11.6 - 15.2 (seconds)   INR 1.68 (*) 0.00 - 1.49   CBC      Component Value Range   WBC 7.6  4.0 - 10.5 (K/uL)   RBC 3.60 (*) 3.87 - 5.11 (MIL/uL)   Hemoglobin 9.8 (*) 12.0 - 15.0 (g/dL)   HCT 91.4 (*) 78.2 - 46.0 (%)   MCV 81.9  78.0 - 100.0 (fL)   MCH 27.2  26.0 - 34.0 (pg)   MCHC 33.2  30.0 - 36.0 (g/dL)   RDW 95.6  21.3 - 08.6 (%)   Platelets 331  150 - 400 (K/uL)    Discharge Medications:   Current Discharge Medication List    CONTINUE these medications which have NOT CHANGED   Details  acetaminophen  (TYLENOL) 500 MG tablet Take 1,000 mg by mouth every 6 (six) hours as needed. For pain     ALPRAZolam (XANAX) 0.5 MG tablet Take 0.5 mg by mouth every morning.      aspirin EC 81 MG tablet Take 81 mg by mouth at bedtime.      Calcium Carbonate-Vitamin D (CALTRATE 600+D) 600-400 MG-UNIT per chew tablet Chew 1 tablet by mouth daily.     calcium citrate (CALCITRATE - DOSED IN MG ELEMENTAL CALCIUM) 950 MG tablet Take 1 tablet by mouth daily.      cetirizine (ZYRTEC) 10 MG tablet Take 10 mg by mouth every morning.      clobetasol cream (TEMOVATE) 0.05 % Apply 1 application topically 2 (two) times daily as needed. For dry skin     desvenlafaxine (PRISTIQ) 50 MG 24 hr tablet Take 50 mg by mouth at bedtime.      estradiol (ESTRACE) 2 MG tablet Take 2 mg by mouth daily.      gemfibrozil (LOPID) 600 MG tablet Take 600 mg by mouth 2 (two) times daily before a meal.      hydrOXYzine (ATARAX/VISTARIL) 25 MG tablet Take 25 mg by mouth 1 day or 1 dose.      ibuprofen (ADVIL,MOTRIN) 200 MG tablet Take 400 mg by mouth every 6 (six) hours as needed. For back pain     lansoprazole (PREVACID) 30 MG capsule Take 30 mg by mouth daily.      losartan-hydrochlorothiazide (HYZAAR) 100-25 MG per tablet Take 1 tablet by mouth daily.      nisoldipine (SULAR) 25.5 MG 24 hr tablet Take 25.5 mg by mouth every morning.      ezetimibe (ZETIA) 10 MG tablet Take 10 mg by mouth at bedtime.      metFORMIN (GLUCOPHAGE) 500 MG tablet Take  500 mg by mouth 2 (two) times daily with a meal.      naphazoline-pheniramine (NAPHCON-A) 0.025-0.3 % ophthalmic solution Place 1 drop into both eyes 4 (four) times daily as needed.          Diagnostic Studies: Dg Chest 2 View  06/08/2011  *RADIOLOGY REPORT*  Clinical Data: Preop, hypertension  CHEST - 2 VIEW  Comparison: 06/07/2008 and CT scan of the chest 07/29/2008  Findings: Cardiomediastinal silhouette is stable.  No acute infiltrate or pleural effusion.  No pulmonary edema.   Mild degenerative changes mid thoracic spine.  IMPRESSION: No active disease.  Mild degenerative changes mid thoracic spine.  Original Report Authenticated By: Natasha Mead, M.D.   X-ray Knee Right Port  06/22/2011  *RADIOLOGY REPORT*  Clinical Data: Postop right knee replacement  PORTABLE RIGHT KNEE - 1-2 VIEW  Comparison: None.  Findings: Two portable views of the right knee were obtained.  The femoral and tibial components of the right knee replacement are in good position.  There is some air in the soft tissues overlying the right knee as well as in the joint space postoperatively.  No complicating features are seen.  IMPRESSION: Right total knee replacement components in good position.  Original Report Authenticated By: Juline Patch, M.D.    Disposition:     Follow-up Information    Follow up with Arrion Burruel,STEVEN R, MD. Schedule an appointment as soon as possible for a visit in 2 weeks. [phone])    Contact information:   [facility] [address] [place] [address] [phone]           Signed: Verlee Rossetti 06/25/2011, 8:31 AM

## 2011-06-25 NOTE — Progress Notes (Signed)
Physical Therapy Treatment Patient Details Name: Shirley Juarez MRN: [id number] DOB: 08-28-1950 Today's Date: 06/25/2011  PT Assessment/Plan  PT - Assessment/Plan Comments on Treatment Session: patient demonstrates safe transfer and ambulation mobility with rolling waker, as well as platform step negotiation. Patient remains to demonstrate decreased R quad strength and inabiltiy to maintain foot flat during stance phase of ambulation. patient safe to return home with 24/7 assist and home PT. PT Plan: Discharge plan remains appropriate PT Frequency: 7X/week PT Goals  Acute Rehab PT Goals PT Goal: Supine/Side to Sit - Progress: Progressing toward goal PT Goal: Sit to Supine/Side - Progress: Progressing toward goal PT Goal: Sit to Stand - Progress: Progressing toward goal PT Goal: Stand to Sit - Progress: Progressing toward goal PT Transfer Goal: Bed to Chair/Chair to Bed - Progress: Progressing toward goal PT Goal: Ambulate - Progress: Progressing toward goal PT Goal: Up/Down Stairs - Progress: Met  PT Treatment Precautions/Restrictions  Precautions Required Braces or Orthoses:  (per patient and MD patient no longer needs R KI)  Restrictions Weight Bearing Restrictions: No RLE Weight Bearing: Weight bearing as tolerated Mobility (including Balance) Bed Mobility Supine to Sit: 5: Supervision Transfers Sit to Stand: 5: Supervision Stand to Sit: 5: Supervision Stand Pivot Transfers: 5: Supervision Ambulation/Gait Ambulation/Gait Assistance: 5: Supervision Ambulation/Gait Assistance Details (indicate cue type and reason): VC to achieve R foot flat during ambulation Ambulation Distance (Feet): 200 Feet Assistive device: Rolling walker Gait Pattern: Step-through pattern;Decreased step length - right;Decreased stance time - right;Antalgic Gait velocity: decreased gait speed Stairs: Yes Stairs Assistance: 5: Supervision (contact guard ) Stairs Assistance Details (indicate cue type and  reason): vc for sequencing forward  Stair Management Technique: No rails;Forwards;With walker (4 in step to mimic home entry) Number of Stairs: 1     Exercise  Total Joint Exercises Ankle Circles/Pumps: AROM;Both;10 reps Quad Sets: AROM;10 reps;Supine;Right Heel Slides: AAROM;Right;10 reps;Seated Long Arc Quad: Strengthening;AROM;10 reps;Right;Seated End of Session PT - End of Session Equipment Utilized During Treatment: Gait belt Activity Tolerance: Patient tolerated treatment well Patient left: in chair;with call bell in reach General Behavior During Session: [facility] for tasks performed Cognition: [facility] for tasks performed  Marcene Brawn 06/25/2011, 9:04 AM  Lewis Shock, PT, DPT Pager #: [phone] Office #: [phone]

## 2011-06-26 ENCOUNTER — Encounter (HOSPITAL_COMMUNITY): Payer: Self-pay | Admitting: Orthopedic Surgery

## 2012-04-09 ENCOUNTER — Other Ambulatory Visit (HOSPITAL_COMMUNITY): Payer: Self-pay | Admitting: Orthopedic Surgery

## 2012-04-09 DIAGNOSIS — M25561 Pain in right knee: Secondary | ICD-10-CM

## 2012-04-16 ENCOUNTER — Encounter (HOSPITAL_COMMUNITY): Payer: Federal, State, Local not specified - PPO

## 2012-04-16 ENCOUNTER — Encounter (HOSPITAL_COMMUNITY)
Admission: RE | Admit: 2012-04-16 | Discharge: 2012-04-16 | Disposition: A | Discharge status: Home / Self Care | Payer: Federal, State, Local not specified - PPO | Source: Ambulatory Visit | Attending: Orthopedic Surgery | Admitting: Orthopedic Surgery

## 2012-04-16 DIAGNOSIS — M25561 Pain in right knee: Secondary | ICD-10-CM

## 2012-04-16 DIAGNOSIS — M25569 Pain in unspecified knee: Secondary | ICD-10-CM | POA: Insufficient documentation

## 2012-04-16 MED ORDER — TECHNETIUM TC 99M MEDRONATE IV KIT
25.0000 | PACK | Freq: Once | INTRAVENOUS | Status: AC | PRN
  Administered 2012-04-16: 25 via INTRAVENOUS

## 2012-04-23 ENCOUNTER — Encounter (HOSPITAL_COMMUNITY): Payer: Federal, State, Local not specified - PPO

## 2012-04-23 ENCOUNTER — Other Ambulatory Visit (HOSPITAL_COMMUNITY): Payer: Federal, State, Local not specified - PPO

## 2012-09-04 ENCOUNTER — Encounter: Payer: Self-pay | Admitting: Cardiovascular Disease

## 2012-09-04 ENCOUNTER — Ambulatory Visit (HOSPITAL_COMMUNITY): Payer: Federal, State, Local not specified - PPO | Attending: Cardiology

## 2012-09-04 ENCOUNTER — Ambulatory Visit (INDEPENDENT_AMBULATORY_CARE_PROVIDER_SITE_OTHER): Payer: Federal, State, Local not specified - PPO | Admitting: Cardiovascular Disease

## 2012-09-04 VITALS — BP 120/72 | HR 83 | Ht 64.0 in | Wt 168.0 lb

## 2012-09-04 DIAGNOSIS — R072 Precordial pain: Secondary | ICD-10-CM

## 2012-09-04 DIAGNOSIS — E785 Hyperlipidemia, unspecified: Secondary | ICD-10-CM

## 2012-09-04 DIAGNOSIS — I10 Essential (primary) hypertension: Secondary | ICD-10-CM

## 2012-09-04 DIAGNOSIS — I2510 Atherosclerotic heart disease of native coronary artery without angina pectoris: Secondary | ICD-10-CM

## 2012-09-04 DIAGNOSIS — R079 Chest pain, unspecified: Secondary | ICD-10-CM

## 2012-09-04 NOTE — Progress Notes (Signed)
Echocardiogram performed.  

## 2012-09-04 NOTE — Progress Notes (Signed)
History of Present Illness: 62 yo female with history of HTN, HLD, CAD, DM, anxiety and depression who is here today as a new patient for evaluation of chest pain. She had a cath 2004 in [facility] that showed minor disease. She tells me that over the last six months she has been having on/off chest pains behind left breast. The pain lasts for weeks. Not worsened by movement. She has no trouble breathing. She feels fatigued. Pain is not necessarily worse with exertion. Her son is our patient as well and survived an MI at age 44, now with severe systolic dysfunction and followed in the CHF clinic.   Primary Care Physician: Shaune Pollack, MD  Last Lipid Profile: Total chol: 192  HDL: 49  LDL: 126  TG: 188 05/14/12 in primary care  Past Medical History  Diagnosis Date  . Hypertension   . Hyperlipidemia   . Coronary artery disease     Cath 2004 in [place], [place] with minor CAD  . Anxiety   . Sleep apnea     does not use CPAP- it is broken.  Last sleep study was 7 years ago.  Has not worn in  4  years  . Depression   . GERD (gastroesophageal reflux disease)   . Arthritis     Osteoarthritis- knee and back  . Diabetes mellitus     On oral and diet    Past Surgical History  Procedure Laterality Date  . Abdominal hysterectomy    . Knee arthrocentesis    . Total knee arthroplasty  06/22/2011    Bilateral     Current Outpatient Prescriptions  Medication Sig Dispense Refill  . ALPRAZolam (XANAX) 0.5 MG tablet Take 0.5 mg by mouth every morning.        Marland Kitchen aspirin EC 81 MG tablet Take 81 mg by mouth at bedtime.        . Calcium Citrate-Vitamin D (CALCIUM CITRATE + D3 PO) Take one tablet of calcium citrate 630 iu / 500 iu daily      . cetirizine (ZYRTEC) 10 MG tablet Take 10 mg by mouth every morning.        Marland Kitchen estradiol (ESTRACE) 2 MG tablet Take 2 mg by mouth daily.        Marland Kitchen ezetimibe (ZETIA) 10 MG tablet Take 10 mg by mouth at bedtime.        Marland Kitchen gemfibrozil (LOPID) 600 MG tablet Take 600  mg by mouth 2 (two) times daily before a meal.        . hydrOXYzine (ATARAX/VISTARIL) 25 MG tablet Take 25 mg by mouth 1 day or 1 dose.        . lansoprazole (PREVACID) 30 MG capsule Take 30 mg by mouth daily.        Marland Kitchen losartan-hydrochlorothiazide (HYZAAR) 100-25 MG per tablet Take 1 tablet by mouth daily.        . metFORMIN (GLUCOPHAGE) 500 MG tablet Take 500 mg by mouth 2 (two) times daily with a meal.        . nisoldipine (SULAR) 25.5 MG 24 hr tablet Take 25.5 mg by mouth every morning.        . venlafaxine XR (EFFEXOR-XR) 75 MG 24 hr capsule Take one tablet daily       No current facility-administered medications for this visit.    Allergies  Allergen Reactions  . Latex Rash  . Codeine Nausea And Vomiting  . Crestor (Rosuvastatin Calcium) Nausea And Vomiting  .  Fish Oil Nausea And Vomiting  . Lipitor (Atorvastatin Calcium) Nausea And Vomiting  . Peanut-Containing Drug Products Hives  . Welchol (Colesevelam Hcl) Nausea And Vomiting  . Zithromax (Azithromycin) Nausea And Vomiting    History   Social History  . Marital Status: Married    Spouse Name: N/A    Number of Children: 2  . Years of Education: N/A   Occupational History  . Retired [employer]    Social History Main Topics  . Smoking status: Never Smoker   . Smokeless tobacco: Not on file  . Alcohol Use: No  . Drug Use: No  . Sexually Active: Yes    Birth Control/ Protection: Surgical   Other Topics Concern  . Not on file   Social History Narrative  . No narrative on file    Family History  Problem Relation Age of Onset  . Anesthesia problems Neg Hx   . Heart attack Mother 66  . Liver disease Father   . Heart attack Son     Age 63, he is our patient    Review of Systems:  As stated in the HPI and otherwise negative.   BP 120/72  Pulse 83  Ht 5\' 4"  (1.626 m)  Wt 168 lb (76.204 kg)  BMI 28.82 kg/m2  SpO2 98%  Physical Examination: General: Well developed, well nourished, NAD HEENT: OP clear,  mucus membranes moist SKIN: warm, dry. No rashes. Neuro: No focal deficits Musculoskeletal: Muscle strength 5/5 all ext Psychiatric: Mood and affect normal Neck: No JVD, no carotid bruits, no thyromegaly, no lymphadenopathy. Lungs:Clear bilaterally, no wheezes, rhonci, crackles Cardiovascular: Regular rate and rhythm. No murmurs, gallops or rubs. Abdomen:Soft. Bowel sounds present. Non-tender.  Extremities: No lower extremity edema. Pulses are 2 + in the bilateral DP/PT.  EKG: NSR, rate 84 bpm. Possible LVH. Possible old inferior MI  Assessment and Plan:   1. CAD: She has been having chest pains that are mostly atypical but known CAD with last cath 2004. Will arrange exercise stress myoview to exclude ischemia and and echocardiogram to assess LV size and function, exclude structural heart disease. She is instructed to go to the ED with change in symptoms or clinical status.   2. HTN:  BP controlled.    3. Chest pain: As above.

## 2012-09-04 NOTE — Addendum Note (Signed)
Addended by: Williemae Area on: 09/04/2012 01:19 PM   Modules accepted: Orders

## 2012-09-04 NOTE — Patient Instructions (Addendum)
Your physician recommends that you schedule a follow-up appointment in:  4-5 weeks  Your physician has requested that you have an echocardiogram. Echocardiography is a painless test that uses sound waves to create images of your heart. It provides your doctor with information about the size and shape of your heart and how well your heart's chambers and valves are working. This procedure takes approximately one hour. There are no restrictions for this procedure.   Your physician has requested that you have an exercise stress myoview. For further information please visit [email or web address]. Please follow instruction sheet, as given.  

## 2012-09-08 ENCOUNTER — Telehealth: Payer: Self-pay | Admitting: Cardiovascular Disease

## 2012-09-08 NOTE — Progress Notes (Signed)
Quick Note:  Notified patient. She would like to know if she still needs to schedule a Stress Test? Will forward question to MD/RN. States she continues to have intermittent/occasional mild chest pain and increasing fatigue. ______

## 2012-09-08 NOTE — Telephone Encounter (Signed)
Notified patient. She would like to know if she still needs to schedule a Stress Test? Will forward question to MD/RN. States she continues to have intermittent/occasional mild chest pain and increasing fatigue.

## 2012-09-08 NOTE — Telephone Encounter (Signed)
New problem:  Test results.  

## 2012-09-10 NOTE — Telephone Encounter (Signed)
Spoke with Shirley Juarez and told her Dr. Clifton James would like her to have stress test. She is planning on being here for stress test on September 11, 2012

## 2012-09-11 ENCOUNTER — Ambulatory Visit (HOSPITAL_COMMUNITY): Payer: Federal, State, Local not specified - PPO | Attending: Internal Medicine | Admitting: Radiology

## 2012-09-11 VITALS — BP 120/70 | Ht 60.25 in | Wt 165.0 lb

## 2012-09-11 DIAGNOSIS — R5383 Other fatigue: Secondary | ICD-10-CM | POA: Insufficient documentation

## 2012-09-11 DIAGNOSIS — R002 Palpitations: Secondary | ICD-10-CM | POA: Insufficient documentation

## 2012-09-11 DIAGNOSIS — R0602 Shortness of breath: Secondary | ICD-10-CM

## 2012-09-11 DIAGNOSIS — I10 Essential (primary) hypertension: Secondary | ICD-10-CM | POA: Insufficient documentation

## 2012-09-11 DIAGNOSIS — R0609 Other forms of dyspnea: Secondary | ICD-10-CM | POA: Insufficient documentation

## 2012-09-11 DIAGNOSIS — R079 Chest pain, unspecified: Secondary | ICD-10-CM

## 2012-09-11 DIAGNOSIS — I2510 Atherosclerotic heart disease of native coronary artery without angina pectoris: Secondary | ICD-10-CM

## 2012-09-11 DIAGNOSIS — R5381 Other malaise: Secondary | ICD-10-CM | POA: Insufficient documentation

## 2012-09-11 DIAGNOSIS — E119 Type 2 diabetes mellitus without complications: Secondary | ICD-10-CM | POA: Insufficient documentation

## 2012-09-11 DIAGNOSIS — R0989 Other specified symptoms and signs involving the circulatory and respiratory systems: Secondary | ICD-10-CM | POA: Insufficient documentation

## 2012-09-11 DIAGNOSIS — Z8249 Family history of ischemic heart disease and other diseases of the circulatory system: Secondary | ICD-10-CM | POA: Insufficient documentation

## 2012-09-11 MED ORDER — TECHNETIUM TC 99M SESTAMIBI GENERIC - CARDIOLITE
11.0000 | Freq: Once | INTRAVENOUS | Status: AC | PRN
  Administered 2012-09-11: 11 via INTRAVENOUS

## 2012-09-11 MED ORDER — TECHNETIUM TC 99M SESTAMIBI GENERIC - CARDIOLITE
33.0000 | Freq: Once | INTRAVENOUS | Status: AC | PRN
  Administered 2012-09-11: 33 via INTRAVENOUS

## 2012-09-11 NOTE — Progress Notes (Signed)
[facility] SITE 3 NUCLEAR MED [address] [place], [place] [address] [phone]    Cardiology Nuclear Med Study  Shirley Juarez is a 62 y.o. female     MRN : [id number]     DOB: [birth year]  Procedure Date: 09/11/2012  Nuclear Med Background Indication for Stress Test:  Evaluation for Ischemia History:  4/14 EF 60-65%, 2004 Heart Catheterization [facility])  Cardiac Risk Factors: Family History - CAD, Hypertension, Lipids and NIDDM  Symptoms:  Chest Pain, DOE, Fatigue, Palpitations and Rapid HR   Nuclear Pre-Procedure Caffeine/Decaff Intake:  None NPO After: 9:30pm   Lungs:  clear O2 Sat: 97% on room air. IV 0.9% NS with Angio Cath:  20g  IV Site: R Antecubital  IV Started by:  Stanton Kidney, EMT-P  Chest Size (in):  38  Cup Size: D  Height: 5' 0.25" (1.53 m)  Weight:  165 lb (74.844 kg)  BMI:  Body mass index is 31.97 kg/(m^2). Tech Comments:  Meds were taken as directed    Nuclear Med Study 1 or 2 day study: 1 day  Stress Test Type:  Stress  Reading MD: Dietrich Pates, MD  Order Authorizing Provider:  Verne Carrow, MD  Resting Radionuclide: Technetium 54m Sestamibi  Resting Radionuclide Dose: 11.0 mCi   Stress Radionuclide:  Technetium 74m Sestamibi  Stress Radionuclide Dose: 33.0 mCi           Stress Protocol Rest HR: 67 Stress HR: 146  Rest BP: 120/70 Stress BP: 188/59  Exercise Time (min): 7:02 METS: 8.0   Predicted Max HR: 157 bpm % Max HR: 92.99 bpm Rate Pressure Product: 21308   Dose of Adenosine (mg):  n/a Dose of Lexiscan: n/a mg  Dose of Atropine (mg): n/a Dose of Dobutamine: n/a mcg/kg/min (at max HR)  Stress Test Technologist: Bonnita Levan, RN  Nuclear Technologist:  Domenic Polite, CNMT     Rest Procedure:  Myocardial perfusion imaging was performed at rest 45 minutes following the intravenous administration of Technetium 39m Sestamibi. Rest ECG: normal sinus rhythm.  Nonspecific ST T wave changes  Stress Procedure:  The  patient exercised on the treadmill utilizing the Bruce Protocol for 7:02 minutes. The patient stopped due to dyspnea and fatigue. She c/o CP(1/10) prior to exercise which resolved while walking. Technetium 59m Sestamibi was injected at peak exercise and myocardial perfusion imaging was performed after a brief delay. Stress ECG: No significant change from baseline ECG  QPS Raw Data Images: Soft tissue (diaphragm, bowel activity, breast tissue) surround heart. Stress Images:  Normal homogeneous uptake in all areas of the myocardium. Rest Images:  Normal homogeneous uptake in all areas of the myocardium. Subtraction (SDS):  No evidence of ischemia. Transient Ischemic Dilatation (Normal <1.22):  0.81 Lung/Heart Ratio (Normal <0.45):  0.34  Quantitative Gated Spect Images QGS EDV:  49 ml QGS ESV:  10 ml  Impression Exercise Capacity:  Good exercise capacity. BP Response:  Normal blood pressure response. Clinical Symptoms:  No chest pain. ECG Impression:  No significant ST segment change suggestive of ischemia. Comparison with Prior Nuclear Study: No images to compare  Overall Impression:  Normal stress nuclear study.  LV Ejection Fraction: 79%.  LV Wall Motion:  NL LV Function; NL Wall Motion  Dietrich Pates

## 2012-09-15 ENCOUNTER — Telehealth: Payer: Self-pay | Admitting: Cardiovascular Disease

## 2012-09-15 NOTE — Telephone Encounter (Signed)
Follow Up    Pt is calling following up on myocardial perfusion results. Would like to speak to nurse.

## 2012-09-15 NOTE — Telephone Encounter (Signed)
Spoke with pt and reviewed preliminary results with her.  I told her Dr. Clifton James would need to read to give final results and recommendations.

## 2012-10-15 ENCOUNTER — Ambulatory Visit: Payer: Federal, State, Local not specified - PPO | Admitting: Cardiovascular Disease

## 2012-10-29 ENCOUNTER — Encounter: Payer: Self-pay | Admitting: Cardiovascular Disease

## 2012-10-29 ENCOUNTER — Ambulatory Visit (INDEPENDENT_AMBULATORY_CARE_PROVIDER_SITE_OTHER): Payer: Federal, State, Local not specified - PPO | Admitting: Cardiovascular Disease

## 2012-10-29 VITALS — BP 134/78 | HR 78 | Ht 60.4 in | Wt 166.0 lb

## 2012-10-29 DIAGNOSIS — R079 Chest pain, unspecified: Secondary | ICD-10-CM

## 2012-10-29 DIAGNOSIS — I359 Nonrheumatic aortic valve disorder, unspecified: Secondary | ICD-10-CM

## 2012-10-29 DIAGNOSIS — I351 Nonrheumatic aortic (valve) insufficiency: Secondary | ICD-10-CM

## 2012-10-29 NOTE — Patient Instructions (Addendum)
Your physician wants you to follow-up in:  12 months.  You will receive a reminder letter in the mail two months in advance. If you don't receive a letter, please call our office to schedule the follow-up appointment.   

## 2012-10-29 NOTE — Progress Notes (Signed)
History of Present Illness: 62 yo female with history of HTN, HLD, CAD, DM, anxiety and depression who is here today for cardiac follow up. I saw her as a new patient 09/04/12 for evaluation of chest pain. She had a cath 2004 in [facility] that showed minor disease. She told me that over the last six months she has been having on/off chest pains behind left breast. The pain lasts for weeks. Not worsened by movement. She has no trouble breathing. She feels fatigued. Pain is not necessarily worse with exertion. Her son is our patient as well and survived an MI at age 31, now with severe systolic dysfunction and followed in the CHF clinic. I arranged a stress myoview and echo. Her stress test showed no ischemia. Her echo was overall normal with mild AI, normal LV size and function.   She is here today for follow up. Chest pain resolved. She stopped eating pickles before bed and this helped.  Primary Care Physician: Shaune Pollack, MD  Past Medical History  Diagnosis Date  . Hypertension   . Hyperlipidemia   . Coronary artery disease     Cath 2004 in [place], [place] with minor CAD  . Anxiety   . Sleep apnea     does not use CPAP- it is broken.  Last sleep study was 7 years ago.  Has not worn in  4  years  . Depression   . GERD (gastroesophageal reflux disease)   . Arthritis     Osteoarthritis- knee and back  . Diabetes mellitus     On oral and diet    Past Surgical History  Procedure Laterality Date  . Abdominal hysterectomy    . Knee arthrocentesis    . Total knee arthroplasty  06/22/2011    Bilateral     Current Outpatient Prescriptions  Medication Sig Dispense Refill  . ALPRAZolam (XANAX) 0.5 MG tablet Take 0.5 mg by mouth every morning.        Marland Kitchen aspirin EC 81 MG tablet Take 81 mg by mouth at bedtime.        . Calcium Citrate-Vitamin D (CALCIUM CITRATE + D3 PO) Take one tablet of calcium citrate 630 iu / 500 iu daily      . cetirizine (ZYRTEC) 10 MG tablet Take 10 mg by mouth  every morning.        Marland Kitchen estradiol (ESTRACE) 2 MG tablet Take 2 mg by mouth 2 (two) times a week.       . ezetimibe (ZETIA) 10 MG tablet Take 10 mg by mouth at bedtime.        Marland Kitchen gemfibrozil (LOPID) 600 MG tablet Take 600 mg by mouth 2 (two) times daily before a meal.        . hydrOXYzine (ATARAX/VISTARIL) 25 MG tablet Take 25 mg by mouth 1 day or 1 dose.        . lansoprazole (PREVACID) 30 MG capsule Take 30 mg by mouth daily.        Marland Kitchen losartan-hydrochlorothiazide (HYZAAR) 100-25 MG per tablet Take 1 tablet by mouth daily.        . metFORMIN (GLUCOPHAGE) 500 MG tablet Take 500 mg by mouth 2 (two) times daily with a meal.        . nisoldipine (SULAR) 25.5 MG 24 hr tablet Take 25.5 mg by mouth every morning.        . venlafaxine XR (EFFEXOR-XR) 75 MG 24 hr capsule Take one tablet daily  No current facility-administered medications for this visit.    Allergies  Allergen Reactions  . Latex Rash  . Codeine Nausea And Vomiting  . Crestor (Rosuvastatin Calcium) Nausea And Vomiting  . Fish Oil Nausea And Vomiting  . Lipitor (Atorvastatin Calcium) Nausea And Vomiting  . Peanut-Containing Drug Products Hives  . Welchol (Colesevelam Hcl) Nausea And Vomiting  . Zithromax (Azithromycin) Nausea And Vomiting    History   Social History  . Marital Status: Married    Spouse Name: N/A    Number of Children: 2  . Years of Education: N/A   Occupational History  . Retired [employer]    Social History Main Topics  . Smoking status: Never Smoker   . Smokeless tobacco: Not on file  . Alcohol Use: No  . Drug Use: No  . Sexually Active: Yes    Birth Control/ Protection: Surgical   Other Topics Concern  . Not on file   Social History Narrative  . No narrative on file    Family History  Problem Relation Age of Onset  . Anesthesia problems Neg Hx   . Heart attack Mother 48  . Liver disease Father   . Heart attack Son     Age 67, he is our patient    Review of Systems:  As stated  in the HPI and otherwise negative.   BP 134/78  Pulse 78  Ht 5' 0.4" (1.534 m)  Wt 166 lb (75.297 kg)  BMI 32 kg/m2  Physical Examination: General: Well developed, well nourished, NAD HEENT: OP clear, mucus membranes moist SKIN: warm, dry. No rashes. Neuro: No focal deficits Musculoskeletal: Muscle strength 5/5 all ext Psychiatric: Mood and affect normal Neck: No JVD, no carotid bruits, no thyromegaly, no lymphadenopathy. Lungs:Clear bilaterally, no wheezes, rhonci, crackles Cardiovascular: Regular rate and rhythm. No murmurs, gallops or rubs. Abdomen:Soft. Bowel sounds present. Non-tender.  Extremities: No lower extremity edema. Pulses are 2 + in the bilateral DP/PT.  Echo 09/04/12: Left ventricle: The cavity size was normal. Wall thickness was normal. Systolic function was normal. The estimated ejection fraction was in the range of 60% to 65%. Wall motion was normal; there were no regional wall motion abnormalities. Doppler parameters are consistent with abnormal left ventricular relaxation (grade 1 diastolic dysfunction). - Aortic valve: There was no stenosis. Mild regurgitation. - Mitral valve: Trivial regurgitation. - Left atrium: The atrium was mildly dilated. - Right ventricle: The cavity size was normal. Systolic function was normal. - Pulmonary arteries: PA peak pressure: 22mm Hg (S). - Inferior vena cava: The vessel was normal in size; the respirophasic diameter changes were in the normal range (= 50%); findings are consistent with normal central venous pressure. Impressions:  - Normal LV size and systolic function, EF 60-65%. Normal RV size and systolic function. Mild aortic insufficiency.  Stress myoview 09/11/12: Stress Procedure: The patient exercised on the treadmill utilizing the Bruce Protocol for 7:02 minutes. The patient stopped due to dyspnea and fatigue. She c/o CP(1/10) prior to exercise which resolved while walking. Technetium 18m Sestamibi was  injected at peak exercise and myocardial perfusion imaging was performed after a brief delay.  Stress ECG: No significant change from baseline ECG  QPS  Raw Data Images: Soft tissue (diaphragm, bowel activity, breast tissue) surround heart.  Stress Images: Normal homogeneous uptake in all areas of the myocardium.  Rest Images: Normal homogeneous uptake in all areas of the myocardium.  Subtraction (SDS): No evidence of ischemia.  Transient Ischemic Dilatation (Normal <1.22):  0.81  Lung/Heart Ratio (Normal <0.45): 0.34  Quantitative Gated Spect Images  QGS EDV: 49 ml  QGS ESV: 10 ml  Impression  Exercise Capacity: Good exercise capacity.  BP Response: Normal blood pressure response.  Clinical Symptoms: No chest pain.  ECG Impression: No significant ST segment change suggestive of ischemia.  Comparison with Prior Nuclear Study: No images to compare  Overall Impression: Normal stress nuclear study.  LV Ejection Fraction: 79%. LV Wall Motion: NL LV Function; NL Wall Motion  Assessment and Plan:   1. Chest pain: Resolved. Stress test without ischemia. Most likely related to GERD. No further ischemic workup.   2. Aortic insufficiency: Mild by echo April 2014.Will repeat in April 2016.

## 2013-04-09 ENCOUNTER — Other Ambulatory Visit: Payer: Self-pay

## 2014-03-24 ENCOUNTER — Other Ambulatory Visit: Payer: Self-pay | Admitting: Family Medicine

## 2014-03-24 DIAGNOSIS — M8580 Other specified disorders of bone density and structure, unspecified site: Secondary | ICD-10-CM

## 2014-11-29 ENCOUNTER — Other Ambulatory Visit: Payer: Self-pay

## 2016-01-31 ENCOUNTER — Ambulatory Visit (INDEPENDENT_AMBULATORY_CARE_PROVIDER_SITE_OTHER): Payer: Federal, State, Local not specified - PPO | Admitting: Cardiovascular Disease

## 2016-01-31 ENCOUNTER — Encounter: Payer: Self-pay | Admitting: Cardiovascular Disease

## 2016-01-31 VITALS — BP 158/98 | HR 68 | Ht 61.0 in | Wt 161.8 lb

## 2016-01-31 DIAGNOSIS — R072 Precordial pain: Secondary | ICD-10-CM | POA: Diagnosis not present

## 2016-01-31 DIAGNOSIS — I10 Essential (primary) hypertension: Secondary | ICD-10-CM

## 2016-01-31 DIAGNOSIS — I351 Nonrheumatic aortic (valve) insufficiency: Secondary | ICD-10-CM

## 2016-01-31 DIAGNOSIS — I25119 Atherosclerotic heart disease of native coronary artery with unspecified angina pectoris: Secondary | ICD-10-CM

## 2016-01-31 MED ORDER — HYDRALAZINE HCL 25 MG PO TABS
25.0000 mg | ORAL_TABLET | Freq: Three times a day (TID) | ORAL | 3 refills | Status: DC
Start: 2016-01-31 — End: 2016-02-08

## 2016-01-31 NOTE — Progress Notes (Signed)
Chief Complaint  Patient presents with  . Chest Pain      History of Present Illness: 65 yo female with history of HTN, HLD, CAD, DM, anxiety and depression who is here today to re-establish as a new patient. I saw her last in May 2014 with c/o chest pain. She had a cath 2004 in [facility] that showed minor disease. Her pain was atypical occurring at rest and not worsened by exertion. I arranged a stress myoview and echo. Her stress test showed no ischemia. Her echo was overall normal with mild AI, normal LV size and function.   She is here today for follow up. She has many complaints. Headache, posterior neck pain, arm pain. She also has ongoing episodes of chest pain at rest and occasionally worsened with exertion. BP has been high at home. She does not tolerate Calcium channel blockers, Toprol, cloniidine, Norvasc. She is unwilling to try these again. She is on Hyzaar. BP has been 170-180 at home.   Primary Care Physician: Hollice Espy, MD   Past Medical History:  Diagnosis Date  . Anxiety   . Arthritis    Osteoarthritis- knee and back  . Coronary artery disease    Cath 2004 in [place], [place] with minor CAD  . Depression   . Diabetes mellitus    On oral and diet  . GERD (gastroesophageal reflux disease)   . Hyperlipidemia   . Hypertension   . Sleep apnea    does not use CPAP- it is broken.  Last sleep study was 7 years ago.  Has not worn in  4  years    Past Surgical History:  Procedure Laterality Date  . ABDOMINAL HYSTERECTOMY    . KNEE ARTHROCENTESIS    . TOTAL KNEE ARTHROPLASTY  06/22/2011   Bilateral     Current Outpatient Prescriptions  Medication Sig Dispense Refill  . ALPRAZolam (XANAX) 0.5 MG tablet Take 0.5 mg by mouth every morning.      Marland Kitchen aspirin EC 81 MG tablet Take 81 mg by mouth at bedtime.      . Calcium Citrate-Vitamin D (CALCIUM CITRATE + D3 PO) Take one tablet of calcium citrate 630 iu / 500 iu daily    . cetirizine (ZYRTEC) 10 MG tablet Take  10 mg by mouth every morning.      Marland Kitchen estradiol (ESTRACE) 2 MG tablet Take 2 mg by mouth 2 (two) times a week.     . ezetimibe (ZETIA) 10 MG tablet Take 10 mg by mouth at bedtime.      Marland Kitchen gemfibrozil (LOPID) 600 MG tablet Take 600 mg by mouth 2 (two) times daily before a meal.      . hydrOXYzine (ATARAX/VISTARIL) 25 MG tablet Take 25 mg by mouth 1 day or 1 dose.      . lansoprazole (PREVACID) 30 MG capsule Take 30 mg by mouth daily.      Marland Kitchen losartan-hydrochlorothiazide (HYZAAR) 100-25 MG per tablet Take 1 tablet by mouth daily.      . metFORMIN (GLUCOPHAGE) 500 MG tablet Take 500 mg by mouth 2 (two) times daily with a meal.      . venlafaxine XR (EFFEXOR-XR) 75 MG 24 hr capsule Take one tablet daily    . hydrALAZINE (APRESOLINE) 25 MG tablet Take 1 tablet (25 mg total) by mouth 3 (three) times daily. 90 tablet 3   No current facility-administered medications for this visit.     Allergies  Allergen Reactions  . Latex Rash  .  Codeine Nausea And Vomiting  . Crestor [Rosuvastatin Calcium] Nausea And Vomiting  . Fish Oil Nausea And Vomiting  . Lipitor [Atorvastatin Calcium] Nausea And Vomiting  . Peanut-Containing Drug Products Hives  . Welchol [Colesevelam Hcl] Nausea And Vomiting  . Zithromax [Azithromycin] Nausea And Vomiting    Social History   Social History  . Marital status: Married    Spouse name: N/A  . Number of children: 2  . Years of education: N/A   Occupational History  . Retired [employer]    Social History Main Topics  . Smoking status: Never Smoker  . Smokeless tobacco: Not on file  . Alcohol use No  . Drug use: No  . Sexual activity: Yes    Birth control/ protection: Surgical   Other Topics Concern  . Not on file   Social History Narrative  . No narrative on file    Family History  Problem Relation Age of Onset  . Anesthesia problems Neg Hx   . Heart attack Mother 79  . Liver disease Father   . Heart attack Son     Age 54, he is our patient     Review of Systems:  As stated in the HPI and otherwise negative.   BP (!) 158/98   Pulse 68   Ht 5\' 1"  (1.549 m)   Wt 161 lb 12.8 oz (73.4 kg)   BMI 30.57 kg/m   Physical Examination: General: Well developed, well nourished, NAD  HEENT: OP clear, mucus membranes moist  SKIN: warm, dry. No rashes. Neuro: No focal deficits  Musculoskeletal: Muscle strength 5/5 all ext  Psychiatric: Mood and affect normal  Neck: No JVD, no carotid bruits, no thyromegaly, no lymphadenopathy.  Lungs:Clear bilaterally, no wheezes, rhonci, crackles Cardiovascular: Regular rate and rhythm. No murmurs, gallops or rubs. Abdomen:Soft. Bowel sounds present. Non-tender.  Extremities: No lower extremity edema. Pulses are 2 + in the bilateral DP/PT.  Echo 09/04/12: Left ventricle: The cavity size was normal. Wall thickness was normal. Systolic function was normal. The estimated ejection fraction was in the range of 60% to 65%. Wall motion was normal; there were no regional wall motion abnormalities. Doppler parameters are consistent with abnormal left ventricular relaxation (grade 1 diastolic dysfunction). - Aortic valve: There was no stenosis. Mild regurgitation. - Mitral valve: Trivial regurgitation. - Left atrium: The atrium was mildly dilated. - Right ventricle: The cavity size was normal. Systolic function was normal. - Pulmonary arteries: PA peak pressure: 22mm Hg (S). - Inferior vena cava: The vessel was normal in size; the respirophasic diameter changes were in the normal range (= 50%); findings are consistent with normal central venous pressure. Impressions:  - Normal LV size and systolic function, EF 60-65%. Normal RV size and systolic function. Mild aortic insufficiency.  Stress myoview 09/11/12: Stress Procedure: The patient exercised on the treadmill utilizing the Bruce Protocol for 7:02 minutes. The patient stopped due to dyspnea and fatigue. She c/o CP(1/10) prior to exercise which  resolved while walking. Technetium 28m Sestamibi was injected at peak exercise and myocardial perfusion imaging was performed after a brief delay.  Stress ECG: No significant change from baseline ECG  QPS  Raw Data Images: Soft tissue (diaphragm, bowel activity, breast tissue) surround heart.  Stress Images: Normal homogeneous uptake in all areas of the myocardium.  Rest Images: Normal homogeneous uptake in all areas of the myocardium.  Subtraction (SDS): No evidence of ischemia.  Transient Ischemic Dilatation (Normal <1.22): 0.81  Lung/Heart Ratio (Normal <0.45): 0.34  Quantitative Gated Spect Images  QGS EDV: 49 ml  QGS ESV: 10 ml  Impression  Exercise Capacity: Good exercise capacity.  BP Response: Normal blood pressure response.  Clinical Symptoms: No chest pain.  ECG Impression: No significant ST segment change suggestive of ischemia.  Comparison with Prior Nuclear Study: No images to compare  Overall Impression: Normal stress nuclear study.  LV Ejection Fraction: 79%. LV Wall Motion: NL LV Function; NL Wall Motion  EKG:  EKG is ordered today. The ekg ordered today demonstrates NSR, rate 68 bpm. Inferior Q waves.   Recent Labs: No results found for requested labs within last 8760 hours.   Lipid Panel No results found for: CHOL, TRIG, HDL, CHOLHDL, VLDL, LDLCALC, LDLDIRECT   Wt Readings from Last 3 Encounters:  01/31/16 161 lb 12.8 oz (73.4 kg)  10/29/12 166 lb (75.3 kg)  09/11/12 165 lb (74.8 kg)     Other studies Reviewed: Additional studies/ records that were reviewed today include: . Review of the above records demonstrates:     Assessment and Plan:   1. CAD/Chest pain: She has atypical chest pains. She is known to have mild CAD by cath in 2004. She is having more chest pain now and fatigue. Will arrange Lexiscan stress myoview to exclude ischemia.    2. Aortic insufficiency: Mild by echo April 2014.Will repeat now   3. HTN: BP is uncontrolled. Intolerance to  most medications. See above. Will continue Hyzaar and will try to start Hydralazine 25 mg po TID. Renal artery dopplers to exclude renal artery stenosis.   Current medicines are reviewed at length with the patient today.  The patient does not have concerns regarding medicines.  The following changes have been made:  no change  Labs/ tests ordered today include:   Orders Placed This Encounter  Procedures  . Myocardial Perfusion Imaging  . EKG 12-Lead  . ECHOCARDIOGRAM COMPLETE     Disposition:   FU with me in 6 weeks   Signed, Verne Carrowhristopher Tristine Langi, MD 01/31/2016 11:11 AM    [facility] [address], [place], [place]  [address] Phone: [phone]; Fax: [phone]

## 2016-01-31 NOTE — Patient Instructions (Addendum)
Medication Instructions:   Your physician has recommended you make the following change in your medication:  Stop clonidine.  Wean as directed by Dr. Clifton JamesMcAlhany. Start hydralazine 25 mg by mouth three times daily.     Labwork: none  Testing/Procedures: Your physician has requested that you have a renal artery duplex. During this test, an ultrasound is used to evaluate blood flow to the kidneys. Allow one hour for this exam. Do not eat after midnight the day before and avoid carbonated beverages. Take your medications as you usually do.  Your physician has requested that you have an echocardiogram. Echocardiography is a painless test that uses sound waves to create images of your heart. It provides your doctor with information about the size and shape of your heart and how well your heart's chambers and valves are working. This procedure takes approximately one hour. There are no restrictions for this procedure.  Your physician has requested that you have a lexiscan myoview. For further information please visit [email or web address]. Please follow instruction sheet, as given.    Follow-Up: Your physician recommends that you schedule a follow-up appointment in: about one month. Scheduled for September 22,2017 at 10:30     Any Other Special Instructions Will Be Listed Below (If Applicable).     If you need a refill on your cardiac medications before your next appointment, please call your pharmacy.

## 2016-02-02 ENCOUNTER — Encounter: Payer: Self-pay | Admitting: Cardiovascular Disease

## 2016-02-05 ENCOUNTER — Emergency Department (HOSPITAL_COMMUNITY): Payer: Federal, State, Local not specified - PPO

## 2016-02-05 ENCOUNTER — Observation Stay (HOSPITAL_COMMUNITY)
Admission: EM | Admit: 2016-02-05 | Discharge: 2016-02-08 | Disposition: A | Discharge status: Home / Self Care | Payer: Federal, State, Local not specified - PPO | Attending: Internal Medicine | Admitting: Internal Medicine

## 2016-02-05 ENCOUNTER — Telehealth: Payer: Self-pay | Admitting: Cardiology

## 2016-02-05 ENCOUNTER — Encounter (HOSPITAL_COMMUNITY): Payer: Self-pay | Admitting: Emergency Medicine

## 2016-02-05 DIAGNOSIS — K219 Gastro-esophageal reflux disease without esophagitis: Secondary | ICD-10-CM | POA: Diagnosis not present

## 2016-02-05 DIAGNOSIS — Z96653 Presence of artificial knee joint, bilateral: Secondary | ICD-10-CM | POA: Insufficient documentation

## 2016-02-05 DIAGNOSIS — I10 Essential (primary) hypertension: Secondary | ICD-10-CM | POA: Diagnosis present

## 2016-02-05 DIAGNOSIS — Z8249 Family history of ischemic heart disease and other diseases of the circulatory system: Secondary | ICD-10-CM | POA: Insufficient documentation

## 2016-02-05 DIAGNOSIS — Z7982 Long term (current) use of aspirin: Secondary | ICD-10-CM | POA: Insufficient documentation

## 2016-02-05 DIAGNOSIS — G4489 Other headache syndrome: Secondary | ICD-10-CM | POA: Diagnosis present

## 2016-02-05 DIAGNOSIS — E785 Hyperlipidemia, unspecified: Secondary | ICD-10-CM | POA: Diagnosis present

## 2016-02-05 DIAGNOSIS — E119 Type 2 diabetes mellitus without complications: Secondary | ICD-10-CM | POA: Diagnosis not present

## 2016-02-05 DIAGNOSIS — R0789 Other chest pain: Principal | ICD-10-CM | POA: Insufficient documentation

## 2016-02-05 DIAGNOSIS — M50223 Other cervical disc displacement at C6-C7 level: Secondary | ICD-10-CM | POA: Diagnosis not present

## 2016-02-05 DIAGNOSIS — I2510 Atherosclerotic heart disease of native coronary artery without angina pectoris: Secondary | ICD-10-CM | POA: Insufficient documentation

## 2016-02-05 DIAGNOSIS — F329 Major depressive disorder, single episode, unspecified: Secondary | ICD-10-CM | POA: Insufficient documentation

## 2016-02-05 DIAGNOSIS — R079 Chest pain, unspecified: Secondary | ICD-10-CM | POA: Diagnosis present

## 2016-02-05 DIAGNOSIS — R51 Headache: Secondary | ICD-10-CM | POA: Insufficient documentation

## 2016-02-05 DIAGNOSIS — M542 Cervicalgia: Secondary | ICD-10-CM

## 2016-02-05 DIAGNOSIS — E876 Hypokalemia: Secondary | ICD-10-CM | POA: Diagnosis not present

## 2016-02-05 DIAGNOSIS — F419 Anxiety disorder, unspecified: Secondary | ICD-10-CM | POA: Insufficient documentation

## 2016-02-05 DIAGNOSIS — Z7984 Long term (current) use of oral hypoglycemic drugs: Secondary | ICD-10-CM | POA: Insufficient documentation

## 2016-02-05 DIAGNOSIS — M50221 Other cervical disc displacement at C4-C5 level: Secondary | ICD-10-CM | POA: Diagnosis not present

## 2016-02-05 DIAGNOSIS — R519 Headache, unspecified: Secondary | ICD-10-CM

## 2016-02-05 LAB — BASIC METABOLIC PANEL
ANION GAP: 13 (ref 5–15)
BUN: 13 mg/dL (ref 6–20)
CALCIUM: 10.8 mg/dL — AB (ref 8.9–10.3)
CHLORIDE: 96 mmol/L — AB (ref 101–111)
CO2: 26 mmol/L (ref 22–32)
Creatinine, Ser: 0.95 mg/dL (ref 0.44–1.00)
GFR calc non Af Amer: >60 mL/min (ref >60)
Glucose, Bld: 168 mg/dL — ABNORMAL HIGH (ref 65–99)
POTASSIUM: 3.4 mmol/L — AB (ref 3.5–5.1)
Sodium: 135 mmol/L (ref 135–145)

## 2016-02-05 LAB — CBC
HEMATOCRIT: 45 % (ref 36.0–46.0)
HEMOGLOBIN: 15 g/dL (ref 12.0–15.0)
MCH: 27.4 pg (ref 26.0–34.0)
MCHC: 33.3 g/dL (ref 30.0–36.0)
MCV: 82.3 fL (ref 78.0–100.0)
Platelets: 397 10*3/uL (ref 150–400)
RBC: 5.47 MIL/uL — AB (ref 3.87–5.11)
RDW: 13.3 % (ref 11.5–15.5)
WBC: 9 10*3/uL (ref 4.0–10.5)

## 2016-02-05 LAB — I-STAT TROPONIN, ED: TROPONIN I, POC: 0.01 ng/mL (ref 0.00–0.08)

## 2016-02-05 MED ORDER — SODIUM CHLORIDE 0.9 % IV BOLUS (SEPSIS)
1000.0000 mL | Freq: Once | INTRAVENOUS | Status: AC
  Administered 2016-02-05: 1000 mL via INTRAVENOUS

## 2016-02-05 MED ORDER — DIPHENHYDRAMINE HCL 50 MG/ML IJ SOLN
50.0000 mg | Freq: Once | INTRAMUSCULAR | Status: AC
  Administered 2016-02-05: 50 mg via INTRAVENOUS
  Filled 2016-02-05: qty 1

## 2016-02-05 MED ORDER — METOCLOPRAMIDE HCL 5 MG/ML IJ SOLN
10.0000 mg | Freq: Once | INTRAMUSCULAR | Status: AC
  Administered 2016-02-05: 10 mg via INTRAVENOUS
  Filled 2016-02-05: qty 2

## 2016-02-05 NOTE — ED Provider Notes (Signed)
TIME SEEN: 11:20 AM  CHIEF COMPLAINT: Headache, chest pain and shortness of breath  HPI: Pt is a 65 y.o. female with history of hypertension, diabetes, hyperlipidemia, nonobstructive coronary artery disease seen on cardiac catheterization in 2004 presents to the emergency department with complaints of diffuse throbbing headache that has been present pretty much constant for the past 2 weeks. States she has never had a similar headache. States it gets worse with light and is better to stay still unable to room. States she has been seen by her PCP Dr. Kevan NyGates for this, her cardiologist Dr. Sanjuana KavaMcAlhaney for this is also had an eye exam. She recently had her blood pressure medication changed by her cardiologist with no relief. She is currently on hydralazine, losartan, HCTZ. She denies any head injury. No fever, neck pain or neck stiffness. No numbness, tingling or focal weakness. No changes in her vision.  She has tried Tylenol at home without any relief of pain.  She is also complaining of intermittent episodes of chest pain this week. She describes them as feeling like palpitations and feels her heart rate "throbbing" but also feels a squeezing pain in her left chest without radiation. It also felt short of breath and nauseated. No vomiting, diaphoresis or dizziness. Last cardiac catheterization was approximate 3 years ago per her report. She does have a stress test scheduled for September 12 with Dr. Sanjuana KavaMcAlhaney. Last stress was 3 years ago. She does have a significant family history for cardiac disease. Her mother had her first MI at 66 years old and her son had an MI at 66 years old. She is not aware of any exacerbating or relieving factors are her chest pain. She is currently chest pain-free. She did have chest pain prior to arrival.   PCP - Dr. K[facility]   ROS: See HPI Constitutional: no fever  Eyes: no drainage  ENT: no runny nose   Cardiovascular:   chest pain  Resp: SOB  GI: no vomiting GU:  no dysuria Integumentary: no rash  Allergy: no hives  Musculoskeletal: no leg swelling  Neurological: no slurred speech ROS otherwise negative  PAST MEDICAL HISTORY/PAST SURGICAL HISTORY:  Past Medical History:  Diagnosis Date  . Anxiety   . Arthritis    Osteoarthritis- knee and back  . Coronary artery disease    [place] with minor CAD  . Depression   . Diabetes mellitus    On oral and diet  . GERD (gastroesophageal reflux disease)   . Hyperlipidemia   . Hypertension   . Sleep apnea    does not use CPAP- it is broken.  Last sleep study was 7 years ago.  Has not worn in  4  years    MEDICATIONS:  Prior to Admission medications   Medication Sig Start Date End Date Taking? Authorizing Provider  ALPRAZolam Prudy Feeler(XANAX) 0.5 MG tablet Take 0.5 mg by mouth every morning.      Historical Provider, MD  aspirin EC 81 MG tablet Take 81 mg by mouth at bedtime.      Historical Provider, MD  Calcium Citrate-Vitamin D (CALCIUM CITRATE + D3 PO) Take one tablet of calcium citrate 630 iu / 500 iu daily    Historical Provider, MD  cetirizine (ZYRTEC) 10 MG tablet Take 10 mg by mouth every morning.      Historical Provider, MD  estradiol (ESTRACE) 2 MG tablet Take 2 mg by mouth 2 (two) times a week.     Historical  Provider, MD  ezetimibe (ZETIA) 10 MG tablet Take 10 mg by mouth at bedtime.      Historical Provider, MD  gemfibrozil (LOPID) 600 MG tablet Take 600 mg by mouth 2 (two) times daily before a meal.      Historical Provider, MD  hydrALAZINE (APRESOLINE) 25 MG tablet Take 1 tablet (25 mg total) by mouth 3 (three) times daily. 01/31/16 04/30/16  Kathleene Hazel, MD  hydrOXYzine (ATARAX/VISTARIL) 25 MG tablet Take 25 mg by mouth 1 day or 1 dose.      Historical Provider, MD  lansoprazole (PREVACID) 30 MG capsule Take 30 mg by mouth daily.      Historical Provider, MD  losartan-hydrochlorothiazide (HYZAAR) 100-25 MG per tablet Take 1 tablet by mouth daily.      Historical  Provider, MD  metFORMIN (GLUCOPHAGE) 500 MG tablet Take 500 mg by mouth 2 (two) times daily with a meal.      Historical Provider, MD  venlafaxine XR (EFFEXOR-XR) 75 MG 24 hr capsule Take one tablet daily 09/02/12   Historical Provider, MD    ALLERGIES:  Allergies  Allergen Reactions  . Latex Rash  . Codeine Nausea And Vomiting  . Crestor [Rosuvastatin Calcium] Nausea And Vomiting  . Fish Oil Nausea And Vomiting  . Lipitor [Atorvastatin Calcium] Nausea And Vomiting  . Peanut-Containing Drug Products Hives  . Welchol [Colesevelam Hcl] Nausea And Vomiting  . Zithromax [Azithromycin] Nausea And Vomiting    SOCIAL HISTORY:  Social History  Substance Use Topics  . Smoking status: Never Smoker  . Smokeless tobacco: Never Used  . Alcohol use No    FAMILY HISTORY: Family History  Problem Relation Age of Onset  . Heart attack Mother 39  . Liver disease Father   . Heart attack Son     Age 51, he is our patient  . Anesthesia problems Neg Hx     EXAM: BP 186/76   Pulse 94   Temp 97.6 F (36.4 C) (Oral)   Resp 11   Ht 5' (1.524 m)   Wt 160 lb (72.6 kg)   SpO2 95%   BMI 31.25 kg/m  CONSTITUTIONAL: Alert and oriented and responds appropriately to questions. Appears uncomfortable, afebrile and nontoxic, well-hydrated HEAD: Normocephalic EYES: Conjunctivae clear, PERRL, extraocular movements intact ENT: normal nose; no rhinorrhea; moist mucous membranes NECK: Supple, no meningismus, no LAD  CARD: RRR; S1 and S2 appreciated; no murmurs, no clicks, no rubs, no gallops RESP: Normal chest excursion without splinting or tachypnea; breath sounds clear and equal bilaterally; no wheezes, no rhonchi, no rales, no hypoxia or respiratory distress, speaking full sentences ABD/GI: Normal bowel sounds; non-distended; soft, non-tender, no rebound, no guarding, no peritoneal signs BACK:  The back appears normal and is non-tender to palpation, there is no CVA tenderness EXT: Normal ROM in all  joints; non-tender to palpation; no edema; normal capillary refill; no cyanosis, no calf tenderness or swelling    SKIN: Normal color for age and race; warm; no rash NEURO: Moves all extremities equally, sensation to light touch intact diffusely, cranial nerves II through XII intact, strength 5/5 in all 4 extremities, no dysarthria or aphasia, normal gait PSYCH: The patient's mood and manner are appropriate. Grooming and personal hygiene are appropriate.  MEDICAL DECISION MAKING: Patient here with 2 separate complaints. She's been complaining of headaches that she feels are related to her blood pressure. Pressures currently 186/76. Denies having similar headaches in the past. Denies onset, thunderclap headache. No neurologic deficits. No  infectious symptoms. We'll obtain a CT of her head. Labs ordered in triage are unremarkable including a negative troponin. EKG shows no new ischemic abnormality.  We'll attempt to treat headache at this time with IV Reglan, Benadryl, fluids.   She is also complaining of chest pain shortness of breath. She does have many risk factors for ACS. Her heart score is 5.  First troponin negative. Will obtain chest x-ray. Currently chest pain-free. I feel she will need observation for chest pain rule out given her risk factors.  ED PROGRESS: 1:00 AM  Patient's chest x-ray is clear. Troponin negative. Labs otherwise unremarkable. Head CT shows no acute abnormality. Patient reports her headache is down to an 8/10 after Reglan, Benadryl and fluids. We'll give Toradol and a small dose of IV hydralazine given she is still hypertensive. She is still chest pain-free but again given her risk factors, I feel she should be admitted for a chest pain rule out. She is comfortable with this plan.   2:15 AM  Discussed patient's case with hospitalist, Dr. Katrinka Blazing.  Recommend admission to telemetry, observation bed.  I will place holding orders per their request. Patient and family (if present)  updated with plan. Care transferred to hospitalist service.  I reviewed all nursing notes, vitals, pertinent old records, EKGs, labs, imaging (as available).   EKG Interpretation  Date/Time:  Sunday February 05 2016 19:29:49 EDT Ventricular Rate:  106 PR Interval:  160 QRS Duration: 82 QT Interval:  356 QTC Calculation: 472 R Axis:   72 Text Interpretation:  Sinus tachycardia Biatrial enlargement Nonspecific ST abnormality Abnormal ECG No significant change since last tracing other than rate is faster Confirmed by Amman Bartel,  DO, Shantera Monts ([id number]) on 02/05/2016 11:08:24 PM              Layla Maw Dalal Livengood, DO 02/06/16 [id number]

## 2016-02-05 NOTE — ED Triage Notes (Signed)
C/o elevated BP (202/101), elevated pulse (114), headache, and bilateral eye pressure x 2-3 days.

## 2016-02-05 NOTE — Telephone Encounter (Signed)
Patient called stating that she recently had her BP med changed by Dr. Clifton JamesMcAlhany.  She says that she has had a headache into her neck and her BP has been 196/791mmHg with HR 106bpm and this am BP was 225/14202mmHg with HR 97bpm.  Currently BP 169/6094mmHg and HR 114bpm.  Instructed [facility] to [facility] for further evaluation.  Patient agrees to proceed.

## 2016-02-06 ENCOUNTER — Emergency Department (HOSPITAL_COMMUNITY): Payer: Federal, State, Local not specified - PPO

## 2016-02-06 ENCOUNTER — Encounter (HOSPITAL_COMMUNITY): Payer: Self-pay | Admitting: Radiology

## 2016-02-06 ENCOUNTER — Observation Stay (HOSPITAL_COMMUNITY): Payer: Federal, State, Local not specified - PPO

## 2016-02-06 DIAGNOSIS — R079 Chest pain, unspecified: Secondary | ICD-10-CM | POA: Diagnosis not present

## 2016-02-06 DIAGNOSIS — E785 Hyperlipidemia, unspecified: Secondary | ICD-10-CM

## 2016-02-06 DIAGNOSIS — K219 Gastro-esophageal reflux disease without esophagitis: Secondary | ICD-10-CM | POA: Diagnosis not present

## 2016-02-06 DIAGNOSIS — I10 Essential (primary) hypertension: Secondary | ICD-10-CM | POA: Diagnosis not present

## 2016-02-06 LAB — TROPONIN I
Troponin I: <0.03 ng/mL (ref <0.03)
Troponin I: <0.03 ng/mL (ref <0.03)
Troponin I: <0.03 ng/mL (ref <0.03)

## 2016-02-06 LAB — URINALYSIS, ROUTINE W REFLEX MICROSCOPIC
Bilirubin Urine: NEGATIVE
Glucose, UA: NEGATIVE mg/dL
Hgb urine dipstick: NEGATIVE
KETONES UR: NEGATIVE mg/dL
NITRITE: NEGATIVE
PH: 7 (ref 5.0–8.0)
PROTEIN: NEGATIVE mg/dL
Specific Gravity, Urine: 1.011 (ref 1.005–1.030)

## 2016-02-06 LAB — URINE MICROSCOPIC-ADD ON: RBC / HPF: NONE SEEN RBC/hpf (ref 0–5)

## 2016-02-06 LAB — GLUCOSE, CAPILLARY
GLUCOSE-CAPILLARY: 186 mg/dL — AB (ref 65–99)
Glucose-Capillary: 199 mg/dL — ABNORMAL HIGH (ref 65–99)
Glucose-Capillary: 199 mg/dL — ABNORMAL HIGH (ref 65–99)

## 2016-02-06 MED ORDER — ALPRAZOLAM 0.5 MG PO TABS: 0.5000 mg | ORAL_TABLET | Freq: Two times a day (BID) | ORAL | Status: DC

## 2016-02-06 MED ORDER — GEMFIBROZIL 600 MG PO TABS
600.0000 mg | ORAL_TABLET | Freq: Two times a day (BID) | ORAL | Status: DC
  Administered 2016-02-06 – 2016-02-08 (×4): 600 mg via ORAL
  Filled 2016-02-06 (×6): qty 1

## 2016-02-06 MED ORDER — PANTOPRAZOLE SODIUM 20 MG PO TBEC
20.0000 mg | DELAYED_RELEASE_TABLET | Freq: Every day | ORAL | Status: DC
  Administered 2016-02-06 – 2016-02-08 (×3): 20 mg via ORAL
  Filled 2016-02-06 (×3): qty 1

## 2016-02-06 MED ORDER — GI COCKTAIL ~~LOC~~: 30.0000 mL | Freq: Four times a day (QID) | ORAL | Status: DC | PRN

## 2016-02-06 MED ORDER — KETOROLAC TROMETHAMINE 30 MG/ML IJ SOLN
30.0000 mg | Freq: Once | INTRAMUSCULAR | Status: AC
  Administered 2016-02-06: 30 mg via INTRAVENOUS
  Filled 2016-02-06: qty 1

## 2016-02-06 MED ORDER — LOSARTAN POTASSIUM-HCTZ 100-25 MG PO TABS: 1.0000 | ORAL_TABLET | Freq: Every day | ORAL | Status: DC

## 2016-02-06 MED ORDER — HYDRALAZINE HCL 20 MG/ML IJ SOLN: 10.0000 mg | INTRAMUSCULAR | Status: DC | PRN

## 2016-02-06 MED ORDER — POTASSIUM CHLORIDE CRYS ER 20 MEQ PO TBCR
40.0000 meq | EXTENDED_RELEASE_TABLET | Freq: Once | ORAL | Status: AC
  Administered 2016-02-06: 40 meq via ORAL
  Filled 2016-02-06: qty 2

## 2016-02-06 MED ORDER — BUTALBITAL-APAP-CAFFEINE 50-325-40 MG PO TABS
1.0000 | ORAL_TABLET | ORAL | Status: DC | PRN
  Administered 2016-02-06 – 2016-02-07 (×8): 1 via ORAL
  Filled 2016-02-06 (×8): qty 1

## 2016-02-06 MED ORDER — HYDROXYZINE HCL 25 MG PO TABS
25.0000 mg | ORAL_TABLET | Freq: Every day | ORAL | Status: DC
  Administered 2016-02-06 – 2016-02-07 (×2): 25 mg via ORAL
  Filled 2016-02-06 (×2): qty 1

## 2016-02-06 MED ORDER — HYDROCHLOROTHIAZIDE 25 MG PO TABS
25.0000 mg | ORAL_TABLET | Freq: Every day | ORAL | Status: DC
  Administered 2016-02-06 – 2016-02-08 (×3): 25 mg via ORAL
  Filled 2016-02-06 (×3): qty 1

## 2016-02-06 MED ORDER — HYDRALAZINE HCL 25 MG PO TABS
25.0000 mg | ORAL_TABLET | Freq: Once | ORAL | Status: AC
  Administered 2016-02-06: 25 mg via ORAL
  Filled 2016-02-06: qty 1

## 2016-02-06 MED ORDER — LORATADINE 10 MG PO TABS
10.0000 mg | ORAL_TABLET | Freq: Every day | ORAL | Status: DC
  Administered 2016-02-06 – 2016-02-08 (×3): 10 mg via ORAL
  Filled 2016-02-06 (×3): qty 1

## 2016-02-06 MED ORDER — ENSURE ENLIVE PO LIQD
237.0000 mL | Freq: Two times a day (BID) | ORAL | Status: DC
  Administered 2016-02-06 – 2016-02-08 (×3): 237 mL via ORAL

## 2016-02-06 MED ORDER — HYDRALAZINE HCL 50 MG PO TABS
25.0000 mg | ORAL_TABLET | Freq: Three times a day (TID) | ORAL | Status: DC
  Administered 2016-02-06: 25 mg via ORAL
  Filled 2016-02-06 (×2): qty 1

## 2016-02-06 MED ORDER — HYDRALAZINE HCL 50 MG PO TABS
50.0000 mg | ORAL_TABLET | Freq: Three times a day (TID) | ORAL | Status: DC
  Administered 2016-02-06 – 2016-02-08 (×5): 50 mg via ORAL
  Filled 2016-02-06 (×5): qty 1

## 2016-02-06 MED ORDER — INSULIN ASPART 100 UNIT/ML ~~LOC~~ SOLN
0.0000 [IU] | Freq: Three times a day (TID) | SUBCUTANEOUS | Status: DC
  Administered 2016-02-06 – 2016-02-07 (×3): 2 [IU] via SUBCUTANEOUS
  Administered 2016-02-07: 7 [IU] via SUBCUTANEOUS
  Administered 2016-02-07: 2 [IU] via SUBCUTANEOUS
  Administered 2016-02-08: 3 [IU] via SUBCUTANEOUS

## 2016-02-06 MED ORDER — ALPRAZOLAM 0.25 MG PO TABS
0.5000 mg | ORAL_TABLET | Freq: Once | ORAL | Status: AC
  Administered 2016-02-06: 0.5 mg via ORAL
  Filled 2016-02-06: qty 2

## 2016-02-06 MED ORDER — HYDRALAZINE HCL 20 MG/ML IJ SOLN
5.0000 mg | Freq: Once | INTRAMUSCULAR | Status: AC
  Administered 2016-02-06: 5 mg via INTRAVENOUS
  Filled 2016-02-06: qty 1

## 2016-02-06 MED ORDER — ACETAMINOPHEN 325 MG PO TABS
650.0000 mg | ORAL_TABLET | ORAL | Status: DC | PRN
  Administered 2016-02-08: 650 mg via ORAL
  Filled 2016-02-06: qty 2

## 2016-02-06 MED ORDER — FENTANYL CITRATE (PF) 100 MCG/2ML IJ SOLN: 50.0000 ug | Freq: Once | INTRAMUSCULAR | Status: DC

## 2016-02-06 MED ORDER — ASPIRIN EC 81 MG PO TBEC
81.0000 mg | DELAYED_RELEASE_TABLET | Freq: Every day | ORAL | Status: DC
  Administered 2016-02-06 – 2016-02-07 (×2): 81 mg via ORAL
  Filled 2016-02-06 (×2): qty 1

## 2016-02-06 MED ORDER — ALPRAZOLAM 0.5 MG PO TABS
0.5000 mg | ORAL_TABLET | Freq: Two times a day (BID) | ORAL | Status: DC | PRN
  Administered 2016-02-06 – 2016-02-08 (×4): 0.5 mg via ORAL
  Filled 2016-02-06 (×5): qty 1

## 2016-02-06 MED ORDER — VENLAFAXINE HCL ER 75 MG PO CP24
75.0000 mg | ORAL_CAPSULE | Freq: Every day | ORAL | Status: DC
  Administered 2016-02-06 – 2016-02-07 (×3): 75 mg via ORAL
  Filled 2016-02-06 (×3): qty 1

## 2016-02-06 MED ORDER — ONDANSETRON HCL 4 MG/2ML IJ SOLN: 4.0000 mg | Freq: Four times a day (QID) | INTRAMUSCULAR | Status: DC | PRN

## 2016-02-06 MED ORDER — ENOXAPARIN SODIUM 40 MG/0.4ML ~~LOC~~ SOLN
40.0000 mg | SUBCUTANEOUS | Status: DC
  Administered 2016-02-06 – 2016-02-08 (×3): 40 mg via SUBCUTANEOUS
  Filled 2016-02-06 (×3): qty 0.4

## 2016-02-06 MED ORDER — TRIAMCINOLONE ACETONIDE 0.1 % EX CREA
1.0000 "application " | TOPICAL_CREAM | Freq: Two times a day (BID) | CUTANEOUS | Status: DC | PRN
  Filled 2016-02-06: qty 15

## 2016-02-06 MED ORDER — LOSARTAN POTASSIUM 50 MG PO TABS
100.0000 mg | ORAL_TABLET | Freq: Every day | ORAL | Status: DC
  Administered 2016-02-06 – 2016-02-08 (×3): 100 mg via ORAL
  Filled 2016-02-06 (×3): qty 2

## 2016-02-06 MED ORDER — POTASSIUM CHLORIDE CRYS ER 20 MEQ PO TBCR
30.0000 meq | EXTENDED_RELEASE_TABLET | ORAL | Status: AC
  Administered 2016-02-06: 30 meq via ORAL
  Filled 2016-02-06: qty 1

## 2016-02-06 NOTE — ED Notes (Signed)
Hospitalist at the bedside 

## 2016-02-06 NOTE — Consult Note (Signed)
Primary cardiologist: Dr Earney Hamburg Consulting cardiologist: Dr Dina Rich Requesting physician: Dr Arthor Captain Indication: chest pain  Clinical Summary Ms. Throgmorton is a 65 y.o.female history of CAD, HTN, HL, DM2, anxiety/depression admitted with chest pain. Previous notes indicate a long history of chest pain with previous workup not consistent with CAD. Cath [facility] 2004 minor disease. 09/2012 exercise nuclear stress test went 7 min without ischemic changes, imaging showed no ischemia. Seen by Dr Clifton James in clinic 01/31/16 with multiple complaints including headache, neck pain, and arm pain. Some exertional component to her arm/chest pain at that time, an outpatient nuclear stress was ordered as well as a f/u echo.   Fairly atypical chest pain. Symptoms start in neck, and spread into head, chest, and arms. At times can be worst with exertion. The headache component can last several hours, the chest pain is fairly intermittent.   BP meds intolerant to CCBs, toprol, clonidine, norvasc. Has been on losartan-HCTZ 100/25 at home and hydralazine 25mg  tid.    Allergies  Allergen Reactions  . Latex Rash  . Codeine Nausea And Vomiting  . Crestor [Rosuvastatin Calcium] Nausea And Vomiting  . Fish Oil Nausea And Vomiting  . Lipitor [Atorvastatin Calcium] Nausea And Vomiting  . Peanut-Containing Drug Products Hives  . Welchol [Colesevelam Hcl] Nausea And Vomiting  . Zithromax [Azithromycin] Nausea And Vomiting    Medications Scheduled Medications: . aspirin EC  81 mg Oral QHS  . enoxaparin (LOVENOX) injection  40 mg Subcutaneous Q24H  . feeding supplement (ENSURE ENLIVE)  237 mL Oral BID BM  . gemfibrozil  600 mg Oral BID AC  . hydrALAZINE  25 mg Oral TID  . hydrochlorothiazide  25 mg Oral Daily  . hydrOXYzine  25 mg Oral QHS  . insulin aspart  0-9 Units Subcutaneous TID WC  . loratadine  10 mg Oral Daily  . losartan  100 mg Oral Daily  . pantoprazole  20 mg Oral Daily  .  venlafaxine XR  75 mg Oral QHS     Infusions:     PRN Medications:  acetaminophen, ALPRAZolam, butalbital-acetaminophen-caffeine, gi cocktail, hydrALAZINE, ondansetron (ZOFRAN) IV, triamcinolone cream   Past Medical History:  Diagnosis Date  . Anxiety   . Arthritis    Osteoarthritis- knee and back  . Coronary artery disease    Cath 2004 in [place], [place] with minor CAD  . Depression   . Diabetes mellitus    On oral and diet  . GERD (gastroesophageal reflux disease)   . Hyperlipidemia   . Hypertension   . Sleep apnea    does not use CPAP- it is broken.  Last sleep study was 7 years ago.  Has not worn in  4  years    Past Surgical History:  Procedure Laterality Date  . ABDOMINAL HYSTERECTOMY    . KNEE ARTHROCENTESIS    . TOTAL KNEE ARTHROPLASTY  06/22/2011   Bilateral     Family History  Problem Relation Age of Onset  . Heart attack Mother 45  . Liver disease Father   . Heart attack Son     Age 57, he is our patient  . Anesthesia problems Neg Hx     Social History Ms. Holcomb reports that she has never smoked. She has never used smokeless tobacco. Ms. Boss reports that she does not drink alcohol.  Review of Systems CONSTITUTIONAL: No weight loss, fever, chills, weakness or fatigue.  HEENT: Eyes: No visual loss, blurred vision, double vision or yellow  sclerae. No hearing loss, sneezing, congestion, runny nose or sore throat.  SKIN: No rash or itching.  CARDIOVASCULAR: per HPI RESPIRATORY: No shortness of breath, cough or sputum.  GASTROINTESTINAL: No anorexia, nausea, vomiting or diarrhea. No abdominal pain or blood.  GENITOURINARY: no polyuria, no dysuria NEUROLOGICAL: per HPI MUSCULOSKELETAL: No muscle, back pain, joint pain or stiffness.  HEMATOLOGIC: No anemia, bleeding or bruising.  LYMPHATICS: No enlarged nodes. No history of splenectomy.  PSYCHIATRIC: No history of depression or anxiety.      Physical Examination Blood pressure (!) 186/76, pulse  (!) 107, temperature 97.7 F (36.5 C), temperature source Oral, resp. rate 18, height 5\' 1"  (1.549 m), weight 155 lb 10.3 oz (70.6 kg), SpO2 98 %.  Intake/Output Summary (Last 24 hours) at 02/06/16 0953 Last data filed at 02/06/16 0213  Gross per 24 hour  Intake             1000 ml  Output                0 ml  Net             1000 ml    HEENT: sclera clear, throat clear  Cardiovascular: RRR, no m/r/g, no jvd  Respiratory: CTAB  GI: abdomen soft, nt, nd  MSK: no LE edema  Neuro: no focal deficits  Psych: appropriate affect  Lab Results  Basic Metabolic Panel:  Recent Labs Lab 02/05/16 2028  NA 135  K 3.4*  CL 96*  CO2 26  GLUCOSE 168*  BUN 13  CREATININE 0.95  CALCIUM 10.8*    Liver Function Tests: No results for input(s): AST, ALT, ALKPHOS, BILITOT, PROT, ALBUMIN in the last 168 hours.  CBC:  Recent Labs Lab 02/05/16 2028  WBC 9.0  HGB 15.0  HCT 45.0  MCV 82.3  PLT 397    Cardiac Enzymes:  Recent Labs Lab 02/06/16 0428 02/06/16 0728  TROPONINI <0.03 <0.03    BNP: Invalid input(s): POCBNP     Impression/Recommendations 1. Chest pain - long history of chest pain, negative previous workups as listed above. Most recently negative nuclear stress in 2014. Seen by her primary cardiologist last week with symptoms of chest pain, plan was for outpatient echo and nuclear stress. - enzymes neg this admit, EKG without specific ischemic changes. - we will plan for echo today. She had breakfast this AM, plan for lexiscan stress tomorrow AM, will make npo at midnight.   2. HTN - management has been complicated as outpatient due to several medication intolerances. We will increase hydral to 50mg  tid. Other options include stopping hyzaar (losartan/HCTZ) and starting losartan and chlorthatlidone seperately for more potent effects. We will follow with hydral change.   3. Headache - negative CT head. Per primary team    Dina RichJonathan Tamey Wanek, M.D.
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
## 2016-02-06 NOTE — H&P (Signed)
History and Physical    Shirley SoleHelen D Stengel Juarez:[id number]:[id number] DOB: 11/30/1950 DOA: 02/05/2016  Referring MD/NP/PA: Dr. Elesa MassedWard PCP: Shirley Juarez,DONNA RUTH, MD  Patient coming from: Home  Chief Complaint: Headache and chest discomfort  HPI: Shirley Juarez is a 65 y.o. female with medical history significant of  HTN, nonobstructive CAD, DM type 2, HLD, anxiety, depression; who presents with complaints of headache and intermittent chest discomfort. Patient describes a headache that starts on the posterior aspect of her neck and goes up into her head. Pain is constant and throbbing in nature and denies ever having similar headaches like this in the past. Evaluated by her PCP, cardiologist Dr. Clifton Juarez, and had a formal eye exam without a cause to her symptoms. Her blood pressure medications were recently changed. Reports still being on losartan-HCTZ combination pill, but clonidine was stopped and hydralazine was added in its place. At home patient's blood pressures had been as high as 226/102 when reviewing her home blood pressure log. Review of records from cardiology notes that patient did not tolerate CCB, Toprol, clonidine, or Norvasc and was unwilling to retry these medications. Furthermore, the patient complains of intermittent chest pain . Pain is usually located on the left side occuring occasionally at rest and can sometimes be worse by exertion. Dr. Clifton Juarez had set her up to have a echocardiogram and stress Myoview later this month.Patient had been noted to have previous cardiac catheter[place] which revealed nonobstructive coronary arteries.  ED Course: Upon admission to the emergency department patient was evaluated and seen to be afebrile with blood pressures up to 196/85, heart rates up to 107, and O2 saturations maintained. Lab work revealed potassium 3.2, chloride 96, calcium 10.8, glucose 168, and troponin 0.01. CT imaging of the brain showed no acute abnormalities.  Patient was given Reglan, Benadryl, ketorolac,  An IV fluids TRH called to admit for chest pain rule out.   Review of Systems: As per HPI otherwise 10 point review of systems negative.   Past Medical History:  Diagnosis Date  . Anxiety   . Arthritis    Osteoarthritis- knee and back  . Coronary artery disease    Cath [place] minor CAD  . Depression   . Diabetes mellitus    On oral and diet  . GERD (gastroesophageal reflux disease)   . Hyperlipidemia   . Hypertension   . Sleep apnea    does not use CPAP- it is broken.  Last sleep study was 7 years ago.  Has not worn in  4  years    Past Surgical History:  Procedure Laterality Date  . ABDOMINAL HYSTERECTOMY    . KNEE ARTHROCENTESIS    . TOTAL KNEE ARTHROPLASTY  06/22/2011   Bilateral      reports that she has never smoked. She has never used smokeless tobacco. She reports that she does not drink alcohol or use drugs.  Allergies  Allergen Reactions  . Latex Rash  . Codeine Nausea And Vomiting  . Crestor [Rosuvastatin Calcium] Nausea And Vomiting  . Fish Oil Nausea And Vomiting  . Lipitor [Atorvastatin Calcium] Nausea And Vomiting  . Peanut-Containing Drug Products Hives  . Welchol [Colesevelam Hcl] Nausea And Vomiting  . Zithromax [Azithromycin] Nausea And Vomiting    Family History  Problem Relation Age of Onset  . Heart attack Mother [id number]  . Liver disease Father   . Heart attack Son     Age 66, he is  our patient  . Anesthesia problems Neg Hx     Prior to Admission medications   Medication Sig Start Date End Date Taking? Authorizing Provider  ALPRAZolam Prudy Feeler) 0.5 MG tablet Take 0.5 mg by mouth 2 (two) times daily.    Yes Historical Provider, MD  aspirin EC 81 MG tablet Take 81 mg by mouth at bedtime.     Yes Historical Provider, MD  Calcium Citrate-Vitamin D (CALCIUM CITRATE + D3 PO) Take 1 tablet by mouth every morning. Take one tablet of calcium citrate 630 iu / 500 iu daily    Yes Historical  Provider, MD  cetirizine (ZYRTEC) 10 MG tablet Take 10 mg by mouth every morning.     Yes Historical Provider, MD  estradiol (ESTRACE) 2 MG tablet Take 2 mg by mouth 2 (two) times a week.    Yes Historical Provider, MD  gemfibrozil (LOPID) 600 MG tablet Take 600 mg by mouth 2 (two) times daily before a meal.     Yes Historical Provider, MD  hydrALAZINE (APRESOLINE) 25 MG tablet Take 1 tablet (25 mg total) by mouth 3 (three) times daily. 01/31/16 04/30/16 Yes Kathleene Hazel, MD  hydrOXYzine (ATARAX/VISTARIL) 25 MG tablet Take 25 mg by mouth at bedtime.    Yes Historical Provider, MD  lansoprazole (PREVACID) 30 MG capsule Take 30 mg by mouth daily.     Yes Historical Provider, MD  losartan-hydrochlorothiazide (HYZAAR) 100-25 MG per tablet Take 1 tablet by mouth daily.     Yes Historical Provider, MD  metFORMIN (GLUCOPHAGE) 500 MG tablet Take 500 mg by mouth 3 (three) times daily.    Yes Historical Provider, MD  triamcinolone cream (KENALOG) 0.1 % Apply 1 application topically 2 (two) times daily as needed (itching).   Yes Historical Provider, MD  venlafaxine XR (EFFEXOR-XR) 75 MG 24 hr capsule Take 75 mg by mouth at bedtime. Take one tablet daily  09/02/12  Yes Historical Provider, MD    Physical Exam:    Constitutional: Elderly female who appears in moderate distress and uncomfortable on the hospital gurney Vitals:   02/05/16 2315 02/05/16 2330 02/06/16 0145 02/06/16 0200  BP: 183/77 187/75 191/73 170/69  Pulse: 94 93 89 100  Resp: 10 21 16 21   Temp:      TempSrc:      SpO2: 92% 94% 95% 94%  Weight:      Height:       Eyes: PERRL, lids and conjunctivae normal ENMT: Mucous membranes are moist. Posterior pharynx clear of any exudate or lesions.Normal dentition.  Neck: normal, supple, no masses, no thyromegaly Respiratory: clear to auscultation bilaterally, no wheezing, no crackles. Normal respiratory effort. No accessory muscle use.  Cardiovascular: Regular rate and rhythm, no  murmurs / rubs / gallops. No extremity edema. 2+ pedal pulses. No carotid bruits.  Abdomen: no tenderness, no masses palpated. No hepatosplenomegaly. Bowel sounds positive.  Musculoskeletal: no clubbing / cyanosis. No joint deformity upper and lower extremities. Good ROM, no contractures. Normal muscle tone.  Skin: no rashes, lesions, ulcers. No induration Neurologic: CN 2-12 grossly intact. Sensation intact, DTR normal. Strength 5/5 in all 4.  Psychiatric: Normal judgment and insight. Alert and oriented x 3. Normal mood.     Labs on Admission: I have personally reviewed following labs and imaging studies  CBC:  Recent Labs Lab 02/05/16 2028  WBC 9.0  HGB 15.0  HCT 45.0  MCV 82.3  PLT 397   Basic Metabolic Panel:  Recent Labs Lab 02/05/16 2028  NA 135  K 3.4*  CL 96*  CO2 26  GLUCOSE 168*  BUN 13  CREATININE 0.95  CALCIUM 10.8*   GFR: Estimated Creatinine Clearance: 51.8 mL/min (by C-G formula based on SCr of 0.95 mg/dL). Liver Function Tests: No results for input(s): AST, ALT, ALKPHOS, BILITOT, PROT, ALBUMIN in the last 168 hours. No results for input(s): LIPASE, AMYLASE in the last 168 hours. No results for input(s): AMMONIA in the last 168 hours. Coagulation Profile: No results for input(s): INR, PROTIME in the last 168 hours. Cardiac Enzymes: No results for input(s): CKTOTAL, CKMB, CKMBINDEX, TROPONINI in the last 168 hours. BNP (last 3 results) No results for input(s): PROBNP in the last 8760 hours. HbA1C: No results for input(s): HGBA1C in the last 72 hours. CBG: No results for input(s): GLUCAP in the last 168 hours. Lipid Profile: No results for input(s): CHOL, HDL, LDLCALC, TRIG, CHOLHDL, LDLDIRECT in the last 72 hours. Thyroid Function Tests: No results for input(s): TSH, T4TOTAL, FREET4, T3FREE, THYROIDAB in the last 72 hours. Anemia Panel: No results for input(s): VITAMINB12, FOLATE, FERRITIN, TIBC, IRON, RETICCTPCT in the last 72 hours. Urine  analysis:    Component Value Date/Time   COLORURINE YELLOW 02/05/2016 0148   APPEARANCEUR CLOUDY (A) 02/05/2016 0148   LABSPEC 1.011 02/05/2016 0148   PHURINE 7.0 02/05/2016 0148   GLUCOSEU NEGATIVE 02/05/2016 0148   HGBUR NEGATIVE 02/05/2016 0148   BILIRUBINUR NEGATIVE 02/05/2016 0148   KETONESUR NEGATIVE 02/05/2016 0148   PROTEINUR NEGATIVE 02/05/2016 0148   UROBILINOGEN 0.2 06/07/2008 1545   NITRITE NEGATIVE 02/05/2016 0148   LEUKOCYTESUR TRACE (A) 02/05/2016 0148   Sepsis Labs: No results found for this or any previous visit (from the past 240 hour(s)).   Radiological Exams on Admission: Dg Chest 2 View  Result Date: 02/05/2016 CLINICAL DATA:  Shortness of breath, chest pain, headache, history hypertension, coronary artery disease, diabetes mellitus EXAM: CHEST  2 VIEW COMPARISON:  06/08/2011 FINDINGS: Normal heart size, mediastinal contours, and pulmonary vascularity. Calcified granuloma medial LEFT upper lobe. Lungs otherwise clear. No pleural effusion or pneumothorax. Diffuse osseous demineralization. IMPRESSION: No acute abnormalities. Electronically Signed   By: Ulyses Southward M.D.   On: 02/05/2016 23:57   Ct Head Wo Contrast  Result Date: 02/06/2016 CLINICAL DATA:  Acute onset of headache and high blood pressure. Initial encounter. EXAM: CT HEAD WITHOUT CONTRAST TECHNIQUE: Contiguous axial images were obtained from the base of the skull through the vertex without intravenous contrast. COMPARISON:  None. FINDINGS: Brain: No evidence of acute infarction, hemorrhage, hydrocephalus, extra-axial collection or mass lesion/mass effect. The posterior fossa, including the cerebellum, brainstem and fourth ventricle, is within normal limits. The third and lateral ventricles, and basal ganglia are unremarkable in appearance. The cerebral hemispheres are symmetric in appearance, with normal gray-white differentiation. No mass effect or midline shift is seen. Vascular: No hyperdense vessel or  unexpected calcification. Skull: There is no evidence of fracture; visualized osseous structures are unremarkable in appearance. Sinuses/Orbits: The visualized portions of the orbits are within normal limits. The paranasal sinuses and mastoid air cells are well-aerated. Other: No significant soft tissue abnormalities are seen. IMPRESSION: Unremarkable noncontrast CT of the head. Electronically Signed   By: Roanna Raider M.D.   On: 02/06/2016 00:55    EKG: Independently reviewed. Sinus tachycardia with biatrial enlargement  Assessment/Plan Chest pain: Patient was already in the process of being worked up by her cardiologist Dr. Clifton James. Heart score 5. - Admit to telemetry. - Trend troponins - Check echocardiogram -  will need to consult cardiology in a.m. and notify her cardiologist  Headache: Acute on chronic. Patient reports throbbing headache that goes from the posterior aspect of her neck and to the back of her head and is throbbing in nature. - Fioricet prn HA  Hypertension, uncontrolled: Acute on chronic. Blood pressure - Continue Hyzaar  Diabetes mellitus type 2 - Held metformin  - Continue to monitor and add on insulin coverage if needed  Hypokalemia: Potassium 3.4 on admission. - Give 30 mEq of potassium chloride - Continue to monitor   Hypercalcemia: Calcium 10.8 on admission patient on calcium supplementation. - Held calcium supplementation  GERD - Pharmacy substitution of Protonix for Prevacid   DVT prophylaxis: lovenox   Code Status: full Family Communication: Discussed plan with patient and husband who is at bedside Disposition Plan: TBD Consults called: none Admission status: telemetry obs  Clydie Braun MD [facility] Pager [phone]  If 7PM-7AM, please contact night-coverage [email or web address] Password [email or web address]  02/06/2016, 2:39 AM

## 2016-02-06 NOTE — Progress Notes (Signed)
Initial Nutrition Assessment  DOCUMENTATION CODES:   Obesity unspecified  INTERVENTION:    Continue Ensure Enlive po BID, each supplement provides 350 kcal and 20 grams of protein  NUTRITION DIAGNOSIS:   Inadequate oral intake related to poor appetite as evidenced by per patient/family report  GOAL:   Patient will meet greater than or equal to 90% of their needs  MONITOR:   PO intake, Supplement acceptance, Labs, Weight trends, I & O's  REASON FOR ASSESSMENT:   Malnutrition Screening Tool  ASSESSMENT:   65 y.o.female history of CAD, HTN, HL, DM2, anxiety/depression admitted with chest pain. Previous notes indicate a long history of chest pain with previous workup not consistent with CAD. Cath [facility] [place] 2004 minor disease. 09/2012 exercise nuclear stress test went 7 min without ischemic changes, imaging showed no ischemia. Seen by Dr Clifton JamesMcAlhany in clinic 01/31/16 with multiple complaints including headache, neck pain, and arm pain. Some exertional component to her arm/chest pain at that time, an outpatient nuclear stress was ordered as well as a f/u echo.   Patient reports a poor appetite for the past couple weeks. She states she would try and consumes 3 meals per day. Also reveals an 8 lb weight loss during this time. Has Ensure Enlive ordered which she is "sipping" on. Nutrition focused physical exam completed.  No muscle or subcutaneous fat depletion noticed.  Diet Order:  Diet Heart Room service appropriate? Yes; Fluid consistency: Thin Diet NPO time specified  Skin:  Reviewed, no issues  Last BM:  9/3  Height:   Ht Readings from Last 1 Encounters:  02/06/16 5\' 1"  (1.549 m)    Weight:   Wt Readings from Last 1 Encounters:  02/06/16 155 lb 10.3 oz (70.6 kg)   Wt Readings from Last 10 Encounters:  02/06/16 155 lb 10.3 oz (70.6 kg)  01/31/16 161 lb 12.8 oz (73.4 kg)  10/29/12 166 lb (75.3 kg)  09/11/12 165 lb (74.8 kg)  09/04/12 168 lb (76.2 kg)  06/08/11  167 lb 5.3 oz (75.9 kg)    Ideal Body Weight:  47.7 kg  BMI:  Body mass index is 29.41 kg/m.  Estimated Nutritional Needs:   Kcal:  1750-1950  Protein:  80-90 gm  Fluid:  1.7-1.9 L  EDUCATION NEEDS:   No education needs identified at this time  Maureen ChattersKatie Michaelyn Wall, RD, LDN Pager #: [phone] After-Hours Pager #: [phone]

## 2016-02-07 ENCOUNTER — Observation Stay (HOSPITAL_COMMUNITY): Payer: Federal, State, Local not specified - PPO

## 2016-02-07 ENCOUNTER — Observation Stay (HOSPITAL_BASED_OUTPATIENT_CLINIC_OR_DEPARTMENT_OTHER): Payer: Federal, State, Local not specified - PPO

## 2016-02-07 DIAGNOSIS — R0789 Other chest pain: Secondary | ICD-10-CM | POA: Diagnosis not present

## 2016-02-07 DIAGNOSIS — I10 Essential (primary) hypertension: Secondary | ICD-10-CM

## 2016-02-07 DIAGNOSIS — R079 Chest pain, unspecified: Secondary | ICD-10-CM | POA: Diagnosis not present

## 2016-02-07 DIAGNOSIS — K219 Gastro-esophageal reflux disease without esophagitis: Secondary | ICD-10-CM | POA: Diagnosis not present

## 2016-02-07 LAB — NM MYOCAR MULTI W/SPECT W/WALL MOTION / EF
CHL CUP NUCLEAR SDS: 1
CHL CUP NUCLEAR SRS: 5
CHL CUP STRESS STAGE 1 GRADE: 0 %
CHL CUP STRESS STAGE 1 HR: 89 {beats}/min
CHL CUP STRESS STAGE 3 GRADE: 0 %
CHL CUP STRESS STAGE 3 HR: 107 {beats}/min
CHL CUP STRESS STAGE 3 SPEED: 0 mph
CHL CUP STRESS STAGE 4 GRADE: 0 %
CHL CUP STRESS STAGE 4 SBP: 181 mmHg
CHL CUP STRESS STAGE 4 SPEED: 0 mph
CHL RATE OF PERCEIVED EXERTION: 0
CSEPED: 0 min
CSEPEDS: 0 s
CSEPPBP: 181 mmHg
Estimated workload: 1 METS
LHR: 0.27
LVDIAVOL: 57 mL (ref 46–106)
LVSYSVOL: 27 mL
MPHR: 154 {beats}/min
NUC STRESS TID: 1.15
Peak HR: 100 {beats}/min
Percent HR: 74 %
Percent of predicted max HR: 64 %
Rest HR: 83 {beats}/min
SSS: 4
Stage 1 Speed: 0 mph
Stage 2 Grade: 0 %
Stage 2 HR: 88 {beats}/min
Stage 2 Speed: 0 mph
Stage 3 DBP: 82 mmHg
Stage 3 SBP: 174 mmHg
Stage 4 DBP: 81 mmHg
Stage 4 HR: 100 {beats}/min

## 2016-02-07 LAB — GLUCOSE, CAPILLARY
GLUCOSE-CAPILLARY: 176 mg/dL — AB (ref 65–99)
GLUCOSE-CAPILLARY: 159 mg/dL — AB (ref 65–99)
GLUCOSE-CAPILLARY: 306 mg/dL — AB (ref 65–99)
GLUCOSE-CAPILLARY: 207 mg/dL — AB (ref 65–99)

## 2016-02-07 LAB — ECHOCARDIOGRAM COMPLETE
AVPHT: 458 ms
EERAT: 10.09
FS: 34 % (ref 28–44)
HEIGHTINCHES: 61 in
IVS/LV PW RATIO, ED: 1
LA diam index: 2.35 cm/m2
LA vol index: 16.4 mL/m2
LASIZE: 40 mm
LAVOL: 27.9 mL
LAVOLA4C: 35 mL
LEFT ATRIUM END SYS DIAM: 40 mm
LV E/e' medial: 10.09
LV E/e'average: 10.09
LV PW d: 8.67 mm — AB (ref 0.6–1.1)
LV TDI E'MEDIAL: 5.77
LV e' LATERAL: 5.33 cm/s
LVOT area: 3.14 cm2
LVOTD: 20 mm
Lateral S' vel: 12.7 cm/s
MVPKAVEL: 108 m/s
MVPKEVEL: 53.8 m/s
RV TAPSE: 14.6 mm
TDI e' lateral: 5.33
Weight: 2490.32 oz

## 2016-02-07 LAB — BASIC METABOLIC PANEL
Anion gap: 10 (ref 5–15)
BUN: 16 mg/dL (ref 6–20)
CO2: 28 mmol/L (ref 22–32)
CREATININE: 0.84 mg/dL (ref 0.44–1.00)
Calcium: 10.2 mg/dL (ref 8.9–10.3)
Chloride: 100 mmol/L — ABNORMAL LOW (ref 101–111)
Glucose, Bld: 195 mg/dL — ABNORMAL HIGH (ref 65–99)
POTASSIUM: 3.6 mmol/L (ref 3.5–5.1)
SODIUM: 138 mmol/L (ref 135–145)

## 2016-02-07 MED ORDER — AMLODIPINE BESYLATE 10 MG PO TABS
10.0000 mg | ORAL_TABLET | Freq: Every day | ORAL | Status: DC
  Administered 2016-02-07 – 2016-02-08 (×2): 10 mg via ORAL
  Filled 2016-02-07 (×2): qty 1

## 2016-02-07 MED ORDER — TECHNETIUM TC 99M TETROFOSMIN IV KIT
10.0000 | PACK | Freq: Once | INTRAVENOUS | Status: AC | PRN
  Administered 2016-02-07: 10 via INTRAVENOUS

## 2016-02-07 MED ORDER — TECHNETIUM TC 99M TETROFOSMIN IV KIT
30.0000 | PACK | Freq: Once | INTRAVENOUS | Status: AC | PRN
  Administered 2016-02-07: 30 via INTRAVENOUS

## 2016-02-07 MED ORDER — REGADENOSON 0.4 MG/5ML IV SOLN
INTRAVENOUS | Status: AC
  Filled 2016-02-07: qty 5

## 2016-02-07 MED ORDER — REGADENOSON 0.4 MG/5ML IV SOLN
0.4000 mg | Freq: Once | INTRAVENOUS | Status: AC
Start: 2016-02-07 — End: 2016-02-07
  Administered 2016-02-07: 0.4 mg via INTRAVENOUS
  Filled 2016-02-07: qty 5

## 2016-02-07 NOTE — Progress Notes (Signed)
nuc study is neg.  I discussed with Dr. Clifton JamesMcAlhany and Dr. Arthor CaptainElmahi, will keep tonight to control pressure and hope she will be able to go in the AM.   Pt was made aware as well.
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
## 2016-02-07 NOTE — Progress Notes (Signed)
Lexiscan myoview- the stress portion completed without complications.  Nuc results to follow this afternoon.

## 2016-02-07 NOTE — Progress Notes (Signed)
Inpatient Diabetes Program Recommendations  AACE/ADA: New Consensus Statement on Inpatient Glycemic Control (2015)  Target Ranges:  Prepandial:   less than 140 mg/dL      Peak postprandial:   less than 180 mg/dL (1-2 hours)      Critically ill patients:  140 - 180 mg/dL   Results for Shirley Juarez, Belkys D (MRN [id number]) as of 02/07/2016 11:22  Ref. Range 06/24/2011 16:11 02/06/2016 11:31 02/06/2016 16:32 02/06/2016 21:03 02/07/2016 06:19  Glucose-Capillary Latest Ref Range: 65 - 99 mg/dL 657143 (H) 846199 (H) 962199 (H) 186 (H) 176 (H)    Review of Glycemic Control  Diabetes history: DM2 Outpatient Diabetes medications: Metformin 500 mg tid Current orders for Inpatient glycemic control: Novolog correction 0-9 units tid  Inpatient Diabetes Program Recommendations:  Please consider A1c to determine prehospital glycemic control.  Thank you, Billy FischerJudy E. Jannis Atkins, RN, MSN, CDE Inpatient Glycemic Control Team Team Pager [phone] (8am-5pm) 02/07/2016 11:25 AM

## 2016-02-07 NOTE — Progress Notes (Signed)
  Echocardiogram 2D Echocardiogram has been performed.  Delcie RochENNINGTON, Meline Russaw 02/07/2016, 3:16 PM

## 2016-02-07 NOTE — Telephone Encounter (Signed)
I saw her in the hospital today. cdm

## 2016-02-08 DIAGNOSIS — R072 Precordial pain: Secondary | ICD-10-CM | POA: Diagnosis not present

## 2016-02-08 DIAGNOSIS — R51 Headache: Secondary | ICD-10-CM

## 2016-02-08 DIAGNOSIS — E119 Type 2 diabetes mellitus without complications: Secondary | ICD-10-CM | POA: Diagnosis not present

## 2016-02-08 DIAGNOSIS — E876 Hypokalemia: Secondary | ICD-10-CM

## 2016-02-08 DIAGNOSIS — R079 Chest pain, unspecified: Secondary | ICD-10-CM | POA: Diagnosis not present

## 2016-02-08 DIAGNOSIS — I10 Essential (primary) hypertension: Secondary | ICD-10-CM | POA: Diagnosis not present

## 2016-02-08 LAB — GLUCOSE, CAPILLARY: Glucose-Capillary: 205 mg/dL — ABNORMAL HIGH (ref 65–99)

## 2016-02-08 MED ORDER — HYDRALAZINE HCL 100 MG PO TABS: 100.0000 mg | ORAL_TABLET | Freq: Three times a day (TID) | ORAL | 2 refills | Status: DC

## 2016-02-08 MED ORDER — HYDRALAZINE HCL 50 MG PO TABS
100.0000 mg | ORAL_TABLET | Freq: Three times a day (TID) | ORAL | Status: DC
Start: 2016-02-08 — End: 2016-02-08

## 2016-02-08 MED ORDER — HYDRALAZINE HCL 50 MG PO TABS
50.0000 mg | ORAL_TABLET | Freq: Once | ORAL | Status: AC
  Administered 2016-02-08: 50 mg via ORAL
  Filled 2016-02-08: qty 1

## 2016-02-08 MED ORDER — AMLODIPINE BESYLATE 10 MG PO TABS: 10.0000 mg | ORAL_TABLET | Freq: Every day | ORAL | 3 refills | Status: DC
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
## 2016-02-08 NOTE — Progress Notes (Signed)
Spoke withDr. Janee Mornhompson. Forgot to add prednisone taper. [phone]. Dr. Janee Mornhompson gave verbal order over the phone for steroid taper.   Leonidas Rombergaitlin S Bumbledare, RN
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
## 2016-02-08 NOTE — Progress Notes (Signed)
Discussed with the patient and all questioned fully answered. Given paper prescriptions. Pt verbalized understanding of medication changes - able to repeat discharge instructions back to me.   IV removed. Telemetry removed, CCMD notified. Storm Friskaitlin S [place]

## 2016-02-10 ENCOUNTER — Encounter: Payer: Self-pay | Admitting: Cardiovascular Disease

## 2016-02-14 ENCOUNTER — Encounter (HOSPITAL_COMMUNITY): Payer: Federal, State, Local not specified - PPO

## 2016-02-14 ENCOUNTER — Other Ambulatory Visit (HOSPITAL_COMMUNITY): Payer: Federal, State, Local not specified - PPO

## 2016-02-15 ENCOUNTER — Encounter (HOSPITAL_COMMUNITY): Payer: Federal, State, Local not specified - PPO

## 2016-02-15 ENCOUNTER — Inpatient Hospital Stay (HOSPITAL_COMMUNITY): Admission: RE | Admit: 2016-02-15 | Payer: Federal, State, Local not specified - PPO | Source: Ambulatory Visit

## 2016-02-16 DIAGNOSIS — E119 Type 2 diabetes mellitus without complications: Secondary | ICD-10-CM

## 2016-02-16 DIAGNOSIS — E876 Hypokalemia: Secondary | ICD-10-CM

## 2016-02-16 DIAGNOSIS — R519 Headache, unspecified: Secondary | ICD-10-CM

## 2016-02-16 DIAGNOSIS — R51 Headache: Secondary | ICD-10-CM

## 2016-02-24 ENCOUNTER — Ambulatory Visit (INDEPENDENT_AMBULATORY_CARE_PROVIDER_SITE_OTHER): Payer: Federal, State, Local not specified - PPO | Admitting: Cardiovascular Disease

## 2016-02-24 VITALS — BP 116/68 | HR 90 | Ht 60.25 in | Wt 158.0 lb

## 2016-02-24 DIAGNOSIS — I10 Essential (primary) hypertension: Secondary | ICD-10-CM

## 2016-02-24 DIAGNOSIS — I351 Nonrheumatic aortic (valve) insufficiency: Secondary | ICD-10-CM | POA: Diagnosis not present

## 2016-02-24 DIAGNOSIS — I2510 Atherosclerotic heart disease of native coronary artery without angina pectoris: Secondary | ICD-10-CM

## 2016-02-24 NOTE — Progress Notes (Signed)
Chief Complaint  Patient presents with  . coronary artherosclerosis      History of Present Illness: 65 yo female with history of HTN, HLD, CAD, DM, anxiety and depression who is here today for cardiac follow up. I saw her 01/31/16 to re-establish care in our office. She was known to have minor CAD by cath in 2004 in [place]. Her stress test in 2014 showed no ischemia. Echo in 2014 with normal LV function, mild AI. When I saw her August 2017, she had many complaints including headache, posterior neck pain, arm pain, chest pain at rest, elevated BP at home. She does not tolerate Calcium channel blockers, Toprol, clonidine, Norvasc. She is unwilling to try these again. She was admitted to [facility] 02/06/16 with neck pain radiating to her chest. Neck MRI with cervical spine disease. Nuclear stress test 02/07/16 with no ischemia. Echo 02/07/16 with normal LV size and function, mild AI.    She is here today for follow up. She is feeling much better. She has no chest pain or SOB. BP has been much better at home. She is planning to have PT for her neck issues.   Primary Care Physician: Hollice Espy, MD   Past Medical History:  Diagnosis Date  . Anxiety   . Arthritis    Osteoarthritis- knee and back  . Coronary artery disease    Cath 2004 in [place], [place] with minor CAD  . Depression   . Diabetes mellitus    On oral and diet  . GERD (gastroesophageal reflux disease)   . Hyperlipidemia   . Hypertension   . Sleep apnea    does not use CPAP- it is broken.  Last sleep study was 7 years ago.  Has not worn in  4  years    Past Surgical History:  Procedure Laterality Date  . ABDOMINAL HYSTERECTOMY    . KNEE ARTHROCENTESIS    . TOTAL KNEE ARTHROPLASTY  06/22/2011   Bilateral     Current Outpatient Prescriptions  Medication Sig Dispense Refill  . ALPRAZolam (XANAX) 0.5 MG tablet Take 0.5 mg by mouth 2 (two) times daily.     Marland Kitchen aspirin EC 81 MG tablet Take 81 mg by mouth at bedtime.      .  Calcium Citrate-Vitamin D (CALCIUM CITRATE + D3 PO) Take 1 tablet by mouth every morning. Take one tablet of calcium citrate 630 iu / 500 iu daily     . cetirizine (ZYRTEC) 10 MG tablet Take 10 mg by mouth every morning.      Marland Kitchen estradiol (ESTRACE) 2 MG tablet Take 2 mg by mouth 2 (two) times a week.     Marland Kitchen gemfibrozil (LOPID) 600 MG tablet Take 600 mg by mouth 2 (two) times daily before a meal.      . hydrALAZINE (APRESOLINE) 100 MG tablet Take 1 tablet (100 mg total) by mouth 3 (three) times daily. 90 tablet 2  . hydrOXYzine (ATARAX/VISTARIL) 25 MG tablet Take 25 mg by mouth at bedtime.     . lansoprazole (PREVACID) 30 MG capsule Take 30 mg by mouth daily.      Marland Kitchen losartan-hydrochlorothiazide (HYZAAR) 100-25 MG per tablet Take 1 tablet by mouth daily.      . metFORMIN (GLUCOPHAGE) 500 MG tablet Take 500 mg by mouth 3 (three) times daily.     Marland Kitchen triamcinolone cream (KENALOG) 0.1 % Apply 1 application topically 2 (two) times daily as needed (itching).    . venlafaxine XR (EFFEXOR-XR) 75  MG 24 hr capsule Take 75 mg by mouth at bedtime. Take one tablet daily      No current facility-administered medications for this visit.     Allergies  Allergen Reactions  . Latex Rash  . Codeine Nausea And Vomiting  . Crestor [Rosuvastatin Calcium] Nausea And Vomiting  . Fish Oil Nausea And Vomiting  . Lipitor [Atorvastatin Calcium] Nausea And Vomiting  . Peanut-Containing Drug Products Hives  . Welchol [Colesevelam Hcl] Nausea And Vomiting  . Zithromax [Azithromycin] Nausea And Vomiting    Social History   Social History  . Marital status: Married    Spouse name: N/A  . Number of children: 2  . Years of education: N/A   Occupational History  . Retired [employer]    Social History Main Topics  . Smoking status: Never Smoker  . Smokeless tobacco: Never Used  . Alcohol use No  . Drug use: No  . Sexual activity: Yes    Birth control/ protection: Surgical   Other Topics Concern  . Not on file    Social History Narrative  . No narrative on file    Family History  Problem Relation Age of Onset  . Heart attack Mother [id number]  . Liver disease Father   . Heart attack Son     Age 66, he is our patient  . Anesthesia problems Neg Hx     Review of Systems:  As stated in the HPI and otherwise negative.   BP 116/68   Pulse 90   Ht 5' 0.25" (1.53 m)   Wt 71.7 kg (158 lb)   BMI 30.60 kg/m   Physical Examination: General: Well developed, well nourished, NAD  HEENT: OP clear, mucus membranes moist  SKIN: warm, dry. No rashes. Neuro: No focal deficits  Musculoskeletal: Muscle strength 5/5 all ext  Psychiatric: Mood and affect normal  Neck: No JVD, no carotid bruits, no thyromegaly, no lymphadenopathy.  Lungs:Clear bilaterally, no wheezes, rhonci, crackles Cardiovascular: Regular rate and rhythm. No murmurs, gallops or rubs. Abdomen:Soft. Bowel sounds present. Non-tender.  Extremities: No lower extremity edema. Pulses are 2 + in the bilateral DP/PT.  Echo 02/07/16: Left ventricle: The cavity size was normal. Wall thickness was   normal. Systolic function was normal. The estimated ejection   fraction was in the range of 60% to 65%. Wall motion was normal;   there were no regional wall motion abnormalities. Doppler   parameters are consistent with abnormal left ventricular   relaxation (grade 1 diastolic dysfunction). - Aortic valve: There was mild regurgitation. Impressions: - Normal LV systolic function; grade 1 diastolic dysfunction; mild   AI; trace MR.  EKG:  EKG is not ordered today. The ekg ordered today demonstrates   Recent Labs: 02/05/2016: Hemoglobin 15.0; Platelets 397 02/07/2016: BUN 16; Creatinine, Ser 0.84; Potassium 3.6; Sodium 138   Lipid Panel No results found for: CHOL, TRIG, HDL, CHOLHDL, VLDL, LDLCALC, LDLDIRECT   Wt Readings from Last 3 Encounters:  02/24/16 71.7 kg (158 lb)  02/06/16 70.6 kg (155 lb 10.3 oz)  01/31/16 73.4 kg (161 lb 12.8 oz)      Other studies Reviewed: Additional studies/ records that were reviewed today include: . Review of the above records demonstrates:   Assessment and Plan:   1. CAD/Chest pain: She has atypical chest pains which really is radiation from her neck into her chest. She is known to have mild CAD by cath in 2004. Nuclear stress test September 2017 with no ischemia. Echo September  2017 with normal LV function.     2. Aortic insufficiency: Mild by echo September 2017.    3. HTN: BP is controlled now on Hydralazine and Hyzaar. (Intolerance to most medications including CCB, Toprol, clonidine, Norvasc). Will continue Hyzaar and Hydralazine. She does not wish to pursue renal artery dopplers now that BP is controlled.    Current medicines are reviewed at length with the patient today.  The patient does not have concerns regarding medicines.  The following changes have been made:  no change  Labs/ tests ordered today include:   No orders of the defined types were placed in this encounter.    Disposition:   FU with me in 12 months   Signed, Verne Carrow, MD 02/24/2016 11:19 AM    [facility] [address] [place], [place], [place]  [address] Phone: [phone]; Fax: [phone]

## 2016-02-24 NOTE — Patient Instructions (Signed)

## 2016-03-06 ENCOUNTER — Encounter: Payer: Self-pay | Admitting: Physical Therapy

## 2016-03-06 ENCOUNTER — Ambulatory Visit: Payer: Federal, State, Local not specified - PPO | Attending: Family Medicine | Admitting: Physical Therapy

## 2016-03-06 DIAGNOSIS — M542 Cervicalgia: Secondary | ICD-10-CM | POA: Insufficient documentation

## 2016-03-06 DIAGNOSIS — M6281 Muscle weakness (generalized): Secondary | ICD-10-CM | POA: Insufficient documentation
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
## 2016-03-14 ENCOUNTER — Ambulatory Visit: Payer: Federal, State, Local not specified - PPO

## 2016-03-14 DIAGNOSIS — M542 Cervicalgia: Secondary | ICD-10-CM | POA: Diagnosis not present

## 2016-03-14 DIAGNOSIS — M6281 Muscle weakness (generalized): Secondary | ICD-10-CM

## 2016-03-14 NOTE — Patient Instructions (Addendum)
PERFORM ALL EXERCISES GENTLY AND WITH GOOD POSTURE.    20 SECOND HOLD, 3 REPS TO EACH SIDE. 4-5 TIMES EACH DAY.   AROM: Neck Rotation   Turn head slowly to look over one shoulder, then the other.   AROM: Neck Flexion   Bend head forward.   AROM: Lateral Neck Flexion   Slowly tilt head toward one shoulder, then the other.    Posture - Standing   Good posture is important. Avoid slouching and forward head thrust. Maintain curve in low back and align ears over shoulders, hips over ankles.  Pull your belly button in toward your back bone. Posture Tips DO: - stand tall and erect - keep chin tucked in - keep head and shoulders in alignment - check posture regularly in mirror or large window - pull head back against headrest in car seat;  Change your position often.  Sit with lumbar support. DON'T: - slouch or slump while watching TV or reading - sit, stand or lie in one position  for too long;  Sitting is especially hard on the spine so if you sit at a desk/use the computer, then stand up often! Copyright  VHI. All rights reserved.  Posture - Sitting  Sit upright, head facing forward. Try using a roll to support lower back. Keep shoulders relaxed, and avoid rounded back. Keep hips level with knees. Avoid crossing legs for long periods. Copyright  VHI. All rights reserved.  Chronic neck strain can develop because of poor posture and faulty work habits  Postural strain related to slumped sitting and forward head posture is a leading cause of headaches, neck and upper back pain  General strengthening and flexibility exercises are helpful in the treatment of neck pain.  Most importantly, you should learn to correct the posture that may be contributing to chronic pain.   Change positions frequently  Change your work or home environment to improve posture and mechanics.  Trigger Point Dry Needling  . What is Trigger Point Dry Needling (DN)? o DN is a physical therapy technique  used to treat muscle pain and dysfunction. Specifically, DN helps deactivate muscle trigger points (muscle knots).  o A thin filiform needle is used to penetrate the skin and stimulate the underlying trigger point. The goal is for a local twitch response (LTR) to occur and for the trigger point to relax. No medication of any kind is injected during the procedure.   . What Does Trigger Point Dry Needling Feel Like?  o The procedure feels different for each individual patient. Some patients report that they do not actually feel the needle enter the skin and overall the process is not painful. Very mild bleeding may occur. However, many patients feel a deep cramping in the muscle in which the needle was inserted. This is the local twitch response.   . How Will I feel after the treatment? o Soreness is normal, and the onset of soreness may not occur for a few hours. Typically this soreness does not last longer than two days.  o Bruising is uncommon, however; ice can be used to decrease any possible bruising.  o In rare cases feeling tired or nauseous after the treatment is normal. In addition, your symptoms may get worse before they get better, this period will typically not last longer than 24 hours.   . What Can I do After My Treatment? o Increase your hydration by drinking more water for the next 24 hours. o You may place ice or   heat on the areas treated that have become sore, however, do not use heat on inflamed or bruised areas. Heat often brings more relief post needling. o You can continue your regular activities, but vigorous activity is not recommended initially after the treatment for 24 hours. o DN is best combined with other physical therapy such as strengthening, stretching, and other therapies.    [facility] [address], [address] [place], [place] [address] Phone # [phone] Fax [phone] [facility] [address], [address] [place], [place]  [address] Phone # [phone] Fax [phone] 

## 2016-03-14 NOTE — Therapy (Addendum)
[facility] [address], [name], [place], [address] Phone: [phone]   Fax:  [phone]  Physical Therapy Treatment  Patient Details  Name: Shirley Juarez MRN: [id number] Date of Birth: 10-06-1950 Referring Provider: Dr. Inda Merlin  Encounter Date: 03/14/2016      PT End of Session - 03/14/16 1224    Visit Number 2   Number of Visits 10   Date for PT Re-Evaluation 05/01/16   Authorization Type Medicare G codes:  KX at visit 15   PT Start Time 1145   PT Stop Time 1235  dry needling   PT Time Calculation (min) 50 min   Activity Tolerance Patient tolerated treatment well   Behavior During Therapy [facility] for tasks assessed/performed      Past Medical History:  Diagnosis Date  . Anxiety   . Arthritis    Osteoarthritis- knee and back  . Coronary artery disease    Cath 2004 in [place], [place] with minor CAD  . Depression   . Diabetes mellitus    On oral and diet  . GERD (gastroesophageal reflux disease)   . Hyperlipidemia   . Hypertension   . Sleep apnea    does not use CPAP- it is broken.  Last sleep study was 7 years ago.  Has not worn in  4  years    Past Surgical History:  Procedure Laterality Date  . ABDOMINAL HYSTERECTOMY    . KNEE ARTHROCENTESIS    . TOTAL KNEE ARTHROPLASTY  06/22/2011   Bilateral     There were no vitals filed for this visit.      Subjective Assessment - 03/14/16 1144    Subjective E-stim helped last session.  Already feeling better.     Pertinent History HTN, DM, cardiac;  B TKR;     Currently in Pain? Yes   Pain Score 3    Pain Location Neck   Pain Orientation Right;Left   Pain Type Chronic pain   Pain Onset More than a month ago   Pain Frequency Constant   Aggravating Factors  turning head, looking up and down   Pain Relieving Factors medication, ice                         [facility] Adult PT Treatment/Exercise - 03/14/16 0001      Exercises   Exercises  Neck     Neck Exercises: Seated   Other Seated Exercise cervical AROM 3 ways 3x20 seconds     Moist Heat Therapy   Number Minutes Moist Heat 15 Minutes   Moist Heat Location Cervical     Electrical Stimulation   Electrical Stimulation Location Cervical   Electrical Stimulation Action IFC   Electrical Stimulation Parameters 15 minutes   Electrical Stimulation Goals Pain     Manual Therapy   Manual Therapy Soft tissue mobilization;Myofascial release   Manual therapy comments soft tissue elongation with trigger point release to bil. upper trap, cervical paraspinals and suboccipitals          Trigger Point Dry Needling - 03/14/16 1152    Consent Given? Yes   Education Handout Provided Yes   Muscles Treated Upper Body Upper trapezius;Oblique capitus;Suboccipitals muscle group   Upper Trapezius Response Twitch reponse elicited;Palpable increased muscle length   Oblique Capitus Response Twitch response elicited;Palpable increased muscle length   SubOccipitals Response Twitch response elicited;Palpable increased muscle length  PT Education - 03/14/16 1150    Education provided Yes   Education Details cervical AROM, posture DN   Person(s) Educated Patient   Methods Explanation;Demonstration;Handout   Comprehension Verbalized understanding;Returned demonstration          PT Short Term Goals - 03/14/16 1147      PT SHORT TERM GOAL #1   Title The patient will demonstrate knowledge of basic HEP and self care strategies including postural correction  for pain control  04/03/16   Time 4   Period Weeks   Status On-going     PT SHORT TERM GOAL #2   Title Patient will report a 25% improvement in neck pain at night and with turning her head   Time 4   Period Weeks   Status On-going           PT Long Term Goals - 03/06/16 2149      PT LONG TERM GOAL #1   Title The patient will be independent in safe, self progression of HEP needed for further  improvements in ROM and strength  05/01/16   Time 8   Period Weeks   Status New     PT LONG TERM GOAL #2   Title The patient will have improved left sidebending and right rotation to 40 degrees needed for driving   Time 8   Period Weeks   Status New     PT LONG TERM GOAL #3   Title The patient will report a 50% improvement in sleep and social interactions including hanging out with the kids   Time 8   Period Weeks   Status New     PT LONG TERM GOAL #4   Title FOTO functional outcome score improved from 51% to 35% indicating improved function with less pain   Time 8   Period Weeks   Status New               Plan - 03/14/16 1152    Clinical Impression Statement (P)  Pt reports that she already feels better after evaluation including electrical stimulation.  Pt with active trigger points in bil UT and cervical muscles and demonstrated improved tissue mobility and reduced pain after dry needling today.  Pt has received cervical A/ROM and posture education today for HEP.  Pt will continue to benefit from skilled PT for manual, modalities, flexiblity and postural strength.     Rehab Potential (P)  Good   PT Frequency (P)  1x / week   PT Duration (P)  8 weeks   PT Treatment/Interventions (P)  ADLs/Self Care Home Management;Cryotherapy;Ultrasound;Traction;Moist Heat;Electrical Stimulation;Patient/family education;Therapeutic exercise;Manual techniques;Dry needling;Taping   PT Next Visit Plan (P)  Dry needling upper traps, levators, suboccipitals;  soft tissue work;  postural education; cervical isometrics, establish walking program;  e-stim/heat    Consulted and Agree with Plan of Care (P)  Patient      Patient will benefit from skilled therapeutic intervention in order to improve the following deficits and impairments:  (P) Decreased activity tolerance, Decreased range of motion, Decreased strength, Hypomobility, Increased muscle spasms, Increased fascial restricitons,  Pain  Visit Diagnosis: Cervicalgia  Muscle weakness (generalized)     Problem List Patient Active Problem List   Diagnosis Date Noted  . DM type 2 goal A1C below 7.5   . Headache   . Hypokalemia   . Pain in the chest 02/06/2016  . Hypercalcemia 02/06/2016  . GERD (gastroesophageal reflux disease) 02/06/2016  . Coronary atherosclerosis of native  coronary artery 09/04/2012  . Essential hypertension 09/04/2012  . Hyperlipidemia 09/04/2012     Sigurd Sos, PT 03-20-2016 12:26 PM  G-codes:  Carry category Goal status: CJ D/C status: CJ   PHYSICAL THERAPY DISCHARGE SUMMARY  Visits from Start of Care: 2  Current functional level related to goals / functional outcomes: See above for current status.  Pt called to cancel all remaining appointments as she was feeling better.     Remaining deficits: See above   Education / Equipment: HEP Plan: Patient agrees to discharge.  Patient goals were partially met. Patient is being discharged due to being pleased with the current functional level.  ?????        Sigurd Sos, PT 04/19/16 10:05 AM  [facility] [address] [place], [place], [address] Phone: [phone]   Fax:  [phone]  Name: MORNING HALBERG MRN: [id number] Date of Birth: 11/03/1950

## 2016-03-26 ENCOUNTER — Telehealth: Payer: Self-pay | Admitting: Cardiovascular Disease

## 2016-03-26 NOTE — Telephone Encounter (Signed)
I spoke with pt. She cannot take Norvasc due to nausea.  Also stated when taking it she was unable to drive, walk or do anything.  She does not want to go back to see Dr. Kevan NyGates to discuss BP medications because pt states she cannot tolerate anything Dr. Kevan NyGates started her on.  Pt does not want to schedule renal artery dopplers at this time because she is unable to afford this test.

## 2016-03-26 NOTE — Telephone Encounter (Signed)
She does not tolerate Cardizem, toprol, clonidine or Norvasc. She is on max dose ARB, HCTZ and Hydralazine. We cannot increase these meds. Can we see what happended when she took Norvasc? We could try it again at 5 mg daily. If she does not wish to try this, I would have her call primary care to discuss BP meds as I have no other ideas. She can reschedule her renal artery dopplers. chris

## 2016-03-26 NOTE — Telephone Encounter (Signed)
I spoke with pt and confirmed BP readings below. Pt reports earlier today when she got up BP was 154/82. This was before taking AM medications. She is not home so does not have any other readings available but states BP has been consistently running upper 140's -mid 150's/70-85.  Readings are at different times throughout the day.

## 2016-03-26 NOTE — Telephone Encounter (Signed)
New message   Pt c/o BP issue: STAT if pt c/o blurred vision, one-sided weakness or slurred speech  1. What are your last 5 BP readings? Yesterday 149/74  At noon  143/70 , @ evening 142/72 , at bedtime 156/74   2. Are you having any other symptoms (ex. Dizziness, headache, blurred vision, passed out)? No    3. What is your BP issue? Need medication to be increase.

## 2016-03-27 NOTE — Telephone Encounter (Signed)
I do not know what the next best step is in the management of her BP. She does not tolerate any of the other medications that I would use. Thayer Ohmhris

## 2016-03-29 NOTE — Telephone Encounter (Signed)
Reviewed with Dr. Clifton JamesMcAlhany and appt with pharmacist in BP clinic may be an option for pt.  I spoke with pt and she would like to meet with pharmacist. Pt requests appt on November 7,2017.  Appt made for November 7 at 11:30

## 2016-04-05 ENCOUNTER — Encounter: Payer: Federal, State, Local not specified - PPO | Admitting: Physical Therapy

## 2016-04-10 ENCOUNTER — Ambulatory Visit: Payer: Federal, State, Local not specified - PPO

## 2016-04-25 ENCOUNTER — Ambulatory Visit: Payer: Federal, State, Local not specified - PPO | Admitting: Cardiovascular Disease

## 2016-08-01 ENCOUNTER — Other Ambulatory Visit: Payer: Self-pay | Admitting: Cardiovascular Disease

## 2016-08-01 MED ORDER — HYDRALAZINE HCL 100 MG PO TABS: 100.0000 mg | ORAL_TABLET | Freq: Three times a day (TID) | ORAL | 1 refills | Status: DC

## 2017-05-03 ENCOUNTER — Other Ambulatory Visit: Payer: Self-pay | Admitting: Cardiovascular Disease

## 2017-09-30 ENCOUNTER — Other Ambulatory Visit: Payer: Self-pay | Admitting: Cardiovascular Disease

## 2017-10-02 ENCOUNTER — Telehealth: Payer: Self-pay | Admitting: Cardiovascular Disease

## 2017-10-02 MED ORDER — HYDRALAZINE HCL 100 MG PO TABS: 100.0000 mg | ORAL_TABLET | Freq: Three times a day (TID) | ORAL | 1 refills | Status: DC

## 2017-10-02 NOTE — Telephone Encounter (Signed)
New Message   *STAT* If patient is at the pharmacy, call can be transferred to refill team.   1. Which medications need to be refilled? (please list name of each medication and dose if known) hydrALAZINE (APRESOLINE) 100 MG tablet  2. Which pharmacy/location (including street and city if local pharmacy) is medication to be sent to? [facility] [address] [place] - [address]  3. Do they need a 30 day or 90 day supply?

## 2017-10-02 NOTE — Telephone Encounter (Signed)
Outpatient Medication Detail    Disp Refills Start End   hydrALAZINE (APRESOLINE) 100 MG tablet 270 tablet 1 10/02/2017    Sig - Route: Take 1 tablet (100 mg total) by mouth 3 (three) times daily. Please keep appointment on 02/24/18 for further refills. - Oral   Sent to pharmacy as: hydrALAZINE (APRESOLINE) 100 MG tablet   Notes to Pharmacy: Please keep appointment on 02/24/18 for further refills   E-Prescribing Status: Receipt confirmed by pharmacy (10/02/2017 11:57 AM EDT)   Pharmacy   [id number] - [place],  - [address]

## 2017-10-02 NOTE — Telephone Encounter (Signed)
Please advise on refill request as patient has an appointment scheduled but has not been seen here in two years. Also last refill for this medication was sent in on 05/03/17 but it was only for a one month supply. Okay to refill? Thanks, MI

## 2017-10-02 NOTE — Telephone Encounter (Addendum)
I spoke with pt. She states she has been taking apresoline and it is working well.  Last refilled in November 2018 for 90 tablets Pt is scheduled to see Dr. Clifton James on 02/24/18.  I tried to schedule pt for sooner appointment with PA or NP but she refuses to see anyone other than Dr. Clifton James. She is on wait list for sooner appointment.  Pt reports she does see primary care every 6 months. I told pt I would send refills to carry her through to September office visit but she would need to keep this appointment for further refills.  I asked her to continue to follow up with primary care also.  Refills sent to pharmacy for 90 day supply with one refill.

## 2017-10-03 NOTE — Telephone Encounter (Signed)
I spoke with pt and scheduled her to see Dr. Clifton James on 10/07/17 at 8:20

## 2017-10-06 NOTE — Progress Notes (Signed)
Chief Complaint  Patient presents with  . Follow-up    CAD   History of Present Illness: 67 yo female with history of HTN, HLD, CAD, DM, anxiety and depression who is here today for cardiac follow up. I saw her 01/31/16 to re-establish care in our office. She was known to have minor CAD by cath in 2004 in [place]. Her stress test in 2014 showed no ischemia. Echo in 2014 with normal LV function, mild AI. When I saw her August 2017, she had many complaints including headache, posterior neck pain, arm pain, chest pain at rest, elevated BP at home. She was admitted to [facility] 02/06/16 with neck pain radiating to her chest. Neck MRI with cervical spine disease. Nuclear stress test 02/07/16 with no ischemia. Echo 02/07/16 with normal LV size and function, mild AI. She does not tolerate Calcium channel blockers, Toprol, clonidine, Norvasc.  She is here today for follow up. The patient denies any chest pain, dyspnea, lower extremity edema, orthopnea, PND, dizziness, near syncope or syncope. She has been having palpitations. She felt her heart racing last week.   Primary Care Physician: Shaune Pollack, MD   Past Medical History:  Diagnosis Date  . Anxiety   . Arthritis    Osteoarthritis- knee and back  . Coronary artery disease    Cath 2004 in [place], [place] with minor CAD  . Depression   . Diabetes mellitus    On oral and diet  . GERD (gastroesophageal reflux disease)   . Hyperlipidemia   . Hypertension   . Sleep apnea    does not use CPAP- it is broken.  Last sleep study was 7 years ago.  Has not worn in  4  years    Past Surgical History:  Procedure Laterality Date  . ABDOMINAL HYSTERECTOMY    . KNEE ARTHROCENTESIS    . TOTAL KNEE ARTHROPLASTY  06/22/2011   Bilateral     Current Outpatient Medications  Medication Sig Dispense Refill  . ALPRAZolam (XANAX) 0.5 MG tablet Take 0.5 mg by mouth 2 (two) times daily.     Marland Kitchen aspirin EC 81 MG tablet Take 81 mg by mouth at bedtime.      .  Calcium Citrate-Vitamin D (CALCIUM CITRATE + D3 PO) Take 1 tablet by mouth every morning. Take one tablet of calcium citrate 630 iu / 500 iu daily     . cetirizine (ZYRTEC) 10 MG tablet Take 10 mg by mouth every morning.      Marland Kitchen estradiol (ESTRACE) 2 MG tablet Take 2 mg by mouth 2 (two) times a week.     Marland Kitchen gemfibrozil (LOPID) 600 MG tablet Take 600 mg by mouth 2 (two) times daily before a meal.      . hydrALAZINE (APRESOLINE) 100 MG tablet Take 1 tablet (100 mg total) by mouth 3 (three) times daily. 270 tablet 3  . hydrOXYzine (ATARAX/VISTARIL) 25 MG tablet Take 25 mg by mouth at bedtime.     . lansoprazole (PREVACID) 30 MG capsule Take 30 mg by mouth daily.      Marland Kitchen losartan-hydrochlorothiazide (HYZAAR) 100-25 MG per tablet Take 1 tablet by mouth daily.      . metFORMIN (GLUCOPHAGE) 500 MG tablet Take 500 mg by mouth 3 (three) times daily.     Marland Kitchen triamcinolone cream (KENALOG) 0.1 % Apply 1 application topically 2 (two) times daily as needed (itching).    . venlafaxine XR (EFFEXOR-XR) 75 MG 24 hr capsule Take 75 mg by  mouth at bedtime. Take one tablet daily      No current facility-administered medications for this visit.     Allergies  Allergen Reactions  . Latex Rash  . Codeine Nausea And Vomiting  . Crestor [Rosuvastatin Calcium] Nausea And Vomiting  . Fish Oil Nausea And Vomiting  . Lipitor [Atorvastatin Calcium] Nausea And Vomiting  . Peanut-Containing Drug Products Hives  . Welchol [Colesevelam Hcl] Nausea And Vomiting  . Zithromax [Azithromycin] Nausea And Vomiting    Social History   Socioeconomic History  . Marital status: Married    Spouse name: Not on file  . Number of children: 2  . Years of education: Not on file  . Highest education level: Not on file  Occupational History  . Occupation: Retired Film/video editor  Social Needs  . Financial resource strain: Not on file  . Food insecurity:    Worry: Not on file    Inability: Not on file  . Transportation needs:     Medical: Not on file    Non-medical: Not on file  Tobacco Use  . Smoking status: Never Smoker  . Smokeless tobacco: Never Used  Substance and Sexual Activity  . Alcohol use: No  . Drug use: No  . Sexual activity: Yes    Birth control/protection: Surgical  Lifestyle  . Physical activity:    Days per week: Not on file    Minutes per session: Not on file  . Stress: Not on file  Relationships  . Social connections:    Talks on phone: Not on file    Gets together: Not on file    Attends religious service: Not on file    Active member of club or organization: Not on file    Attends meetings of clubs or organizations: Not on file    Relationship status: Not on file  . Intimate partner violence:    Fear of current or ex partner: Not on file    Emotionally abused: Not on file    Physically abused: Not on file    Forced sexual activity: Not on file  Other Topics Concern  . Not on file  Social History Narrative  . Not on file    Family History  Problem Relation Age of Onset  . Heart attack Mother 76  . Liver disease Father   . Heart attack Son        Age 101, he is our patient  . Anesthesia problems Neg Hx     Review of Systems:  As stated in the HPI and otherwise negative.   BP (!) 148/72   Pulse 80   Ht 5' 0.25" (1.53 m)   Wt 161 lb 6.4 oz (73.2 kg)   SpO2 96%   BMI 31.26 kg/m   Physical Examination:  General: Well developed, well nourished, NAD  HEENT: OP clear, mucus membranes moist  SKIN: warm, dry. No rashes. Neuro: No focal deficits  Musculoskeletal: Muscle strength 5/5 all ext  Psychiatric: Mood and affect normal  Neck: No JVD, no carotid bruits, no thyromegaly, no lymphadenopathy.  Lungs:Clear bilaterally, no wheezes, rhonci, crackles Cardiovascular: Regular rate and rhythm. No murmurs, gallops or rubs. Abdomen:Soft. Bowel sounds present. Non-tender.  Extremities: No lower extremity edema. Pulses are 2 + in the bilateral DP/PT.  Echo 02/07/16: Left  ventricle: The cavity size was normal. Wall thickness was   normal. Systolic function was normal. The estimated ejection   fraction was in the range of 60% to 65%. Wall motion was  normal;   there were no regional wall motion abnormalities. Doppler   parameters are consistent with abnormal left ventricular   relaxation (grade 1 diastolic dysfunction). - Aortic valve: There was mild regurgitation. Impressions: - Normal LV systolic function; grade 1 diastolic dysfunction; mild   AI; trace MR.  EKG:  EKG is ordered today. The ekg ordered today demonstrates NSR, rate 80 bpm.   Recent Labs: No results found for requested labs within last 8760 hours.   Lipid Panel No results found for: CHOL, TRIG, HDL, CHOLHDL, VLDL, LDLCALC, LDLDIRECT   Wt Readings from Last 3 Encounters:  10/07/17 161 lb 6.4 oz (73.2 kg)  02/24/16 158 lb (71.7 kg)  02/06/16 155 lb 10.3 oz (70.6 kg)     Other studies Reviewed: Additional studies/ records that were reviewed today include: . Review of the above records demonstrates:   Assessment and Plan:   1. CAD without angina: No chest pain. She is known to have mild CAD by cath in 2004. Nuclear stress test September 2017 with no ischemia. Echo September 2017 with normal LV function.  Will continue ASA. She is statin intolerant.   2. Aortic insufficiency: Mild by echo September 2017. Will repeat echo in 2020.   3. HTN: BP is controlled at home. (She has intolerance to most medications including CCB, Toprol, clonidine, Norvasc). Will continue Hydralazine and Hyzaar.    4. Palpitations: I will arrange a 30 day event monitor  Current medicines are reviewed at length with the patient today.  The patient does not have concerns regarding medicines.  The following changes have been made:  no change  Labs/ tests ordered today include:   Orders Placed This Encounter  Procedures  . CARDIAC EVENT MONITOR  . EKG 12-Lead   Disposition:   FU with me in 12  months  Signed, Verne Carrow, MD 10/07/2017 9:47 AM    [facility] [address] [place], [place], [place]  [address] Phone: [phone]; Fax: [phone]

## 2017-10-07 ENCOUNTER — Ambulatory Visit: Payer: Federal, State, Local not specified - PPO | Admitting: Cardiovascular Disease

## 2017-10-07 ENCOUNTER — Encounter: Payer: Self-pay | Admitting: Cardiovascular Disease

## 2017-10-07 VITALS — BP 148/72 | HR 80 | Ht 60.25 in | Wt 161.4 lb

## 2017-10-07 DIAGNOSIS — R002 Palpitations: Secondary | ICD-10-CM

## 2017-10-07 DIAGNOSIS — I2510 Atherosclerotic heart disease of native coronary artery without angina pectoris: Secondary | ICD-10-CM

## 2017-10-07 DIAGNOSIS — I351 Nonrheumatic aortic (valve) insufficiency: Secondary | ICD-10-CM | POA: Diagnosis not present

## 2017-10-07 DIAGNOSIS — I10 Essential (primary) hypertension: Secondary | ICD-10-CM | POA: Diagnosis not present

## 2017-10-07 MED ORDER — HYDRALAZINE HCL 100 MG PO TABS: 100.0000 mg | ORAL_TABLET | Freq: Three times a day (TID) | ORAL | 3 refills | Status: DC

## 2017-10-07 NOTE — Patient Instructions (Signed)
Medication Instructions:  Your physician recommends that you continue on your current medications as directed. Please refer to the Current Medication list given to you today.   Labwork: none  Testing/Procedures: Your physician has recommended that you wear an event monitor. Event monitors are medical devices that record the heart's electrical activity. Doctors most often [name] these monitors to diagnose arrhythmias. Arrhythmias are problems with the speed or rhythm of the heartbeat. The monitor is a small, portable device. You can wear one while you do your normal daily activities. This is usually used to diagnose what is causing palpitations/syncope (passing out).    Follow-Up: Your physician recommends that you schedule a follow-up appointment in: about 12 months. Please call our office in about 8 months to schedule this appointment    Any Other Special Instructions Will Be Listed Below (If Applicable).     If you need a refill on your cardiac medications before your next appointment, please call your pharmacy.

## 2017-10-17 ENCOUNTER — Ambulatory Visit (INDEPENDENT_AMBULATORY_CARE_PROVIDER_SITE_OTHER): Payer: Federal, State, Local not specified - PPO

## 2017-10-17 DIAGNOSIS — R002 Palpitations: Secondary | ICD-10-CM

## 2017-11-05 DIAGNOSIS — Z96653 Presence of artificial knee joint, bilateral: Secondary | ICD-10-CM | POA: Insufficient documentation

## 2017-11-05 DIAGNOSIS — M25562 Pain in left knee: Secondary | ICD-10-CM | POA: Insufficient documentation

## 2018-02-24 ENCOUNTER — Encounter

## 2018-02-24 ENCOUNTER — Ambulatory Visit: Payer: Federal, State, Local not specified - PPO | Admitting: Cardiovascular Disease

## 2018-10-06 ENCOUNTER — Telehealth: Payer: Self-pay | Admitting: Cardiovascular Disease

## 2018-10-06 ENCOUNTER — Telehealth: Payer: Self-pay | Admitting: Internal Medicine

## 2018-10-06 NOTE — Telephone Encounter (Signed)
° °  5/4 :   Left VM for patient to return call regarding visit on 5/8. Appt needs to be change to virtual visit and if patient can change appt to 5/7. Also needing consent from patient. When patient calls back please transfer call to me. Cell #[phone]

## 2018-10-06 NOTE — Telephone Encounter (Signed)
°  NEW MESSAGE - 5/7 :  Spoke to patient.     PHONE visit on 10/09/2018    Consent confirmed via MyChart   -  10/06/2018

## 2018-10-09 ENCOUNTER — Telehealth: Payer: Federal, State, Local not specified - PPO | Admitting: Cardiovascular Disease

## 2018-10-09 ENCOUNTER — Ambulatory Visit: Payer: Federal, State, Local not specified - PPO | Admitting: Cardiovascular Disease

## 2018-10-10 ENCOUNTER — Ambulatory Visit: Payer: Federal, State, Local not specified - PPO | Admitting: Cardiovascular Disease

## 2019-02-17 NOTE — Progress Notes (Signed)
Chief Complaint  Patient presents with  . Follow-up    CAD   History of Present Illness: 68 yo female with history of HTN, HLD, CAD, DM, anxiety and depression who is here today for cardiac follow up. I saw her 01/31/16 to re-establish care in our office. She was known to have minor CAD by cath in 2004 in [place]. Her stress test in 2014 showed no ischemia. Echo in 2014 with normal LV function, mild AI. When I saw her August 2017, she had many complaints including headache, posterior neck pain, arm pain, chest pain at rest, elevated BP at home. She was admitted to [facility] 02/06/16 with neck pain radiating to her chest. Neck MRI with cervical spine disease. Nuclear stress test 02/07/16 with no ischemia. Echo 02/07/16 with normal LV size and function, mild AI. She does not tolerate Calcium channel blockers, Toprol, clonidine, Norvasc. Cardiac monitor June 2019 with several short runs of atrial tachycardia.   She is here today for follow up. The patient denies any chest pain, dyspnea, palpitations, lower extremity edema, orthopnea, PND, dizziness, near syncope or syncope. Neck pain is better.   Primary Care Physician: Darcus Austin, MD (Inactive)  Past Medical History:  Diagnosis Date  . Anxiety   . Arthritis    Osteoarthritis- knee and back  . Coronary artery disease    Cath 2004 in [place], [place] with minor CAD  . Depression   . Diabetes mellitus    On oral and diet  . GERD (gastroesophageal reflux disease)   . Hyperlipidemia   . Hypertension   . Sleep apnea    does not use CPAP- it is broken.  Last sleep study was 7 years ago.  Has not worn in  4  years    Past Surgical History:  Procedure Laterality Date  . ABDOMINAL HYSTERECTOMY    . KNEE ARTHROCENTESIS    . TOTAL KNEE ARTHROPLASTY  06/22/2011   Bilateral     Current Outpatient Medications  Medication Sig Dispense Refill  . ALPRAZolam (XANAX) 0.5 MG tablet Take 0.5 mg by mouth 2 (two) times daily.     Marland Kitchen aspirin EC 81 MG  tablet Take 81 mg by mouth at bedtime.      . Calcium Citrate-Vitamin D (CALCIUM CITRATE + D3 PO) Take 1 tablet by mouth every morning. Take one tablet of calcium citrate 630 iu / 500 iu daily     . cetirizine (ZYRTEC) 10 MG tablet Take 10 mg by mouth every morning.      Marland Kitchen estradiol (ESTRACE) 2 MG tablet Take 2 mg by mouth 2 (two) times a week.     Marland Kitchen gemfibrozil (LOPID) 600 MG tablet Take 600 mg by mouth 2 (two) times daily before a meal.      . hydrALAZINE (APRESOLINE) 100 MG tablet Take 1 tablet (100 mg total) by mouth 3 (three) times daily. 270 tablet 3  . hydrOXYzine (ATARAX/VISTARIL) 25 MG tablet Take 25 mg by mouth at bedtime.     . lansoprazole (PREVACID) 30 MG capsule Take 30 mg by mouth daily.      Marland Kitchen losartan-hydrochlorothiazide (HYZAAR) 100-25 MG per tablet Take 1 tablet by mouth daily.      . metFORMIN (GLUCOPHAGE) 500 MG tablet Take 500 mg by mouth 3 (three) times daily.     Marland Kitchen triamcinolone cream (KENALOG) 0.1 % Apply 1 application topically 2 (two) times daily as needed (itching).    . venlafaxine XR (EFFEXOR-XR) 75 MG 24 hr  capsule Take 75 mg by mouth at bedtime. Take one tablet daily      No current facility-administered medications for this visit.     Allergies  Allergen Reactions  . Latex Rash  . Codeine Nausea And Vomiting  . Crestor [Rosuvastatin Calcium] Nausea And Vomiting  . Fish Oil Nausea And Vomiting  . Lipitor [Atorvastatin Calcium] Nausea And Vomiting  . Peanut-Containing Drug Products Hives  . Welchol [Colesevelam Hcl] Nausea And Vomiting  . Zithromax [Azithromycin] Nausea And Vomiting    Social History   Socioeconomic History  . Marital status: Married    Spouse name: Not on file  . Number of children: 2  . Years of education: Not on file  . Highest education level: Not on file  Occupational History  . 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 Financial resource strain: Not on file  . Food insecurity    Worry: Not on file    Inability:  Not on file  . Transportation needs    Medical: Not on file    Non-medical: Not on file  Tobacco Use  . Smoking status: Never Smoker  . Smokeless tobacco: Never Used  Substance and Sexual Activity  . Alcohol use: No  . Drug use: No  . Sexual activity: Yes    Birth control/protection: Surgical  Lifestyle  . Physical activity    Days per week: Not on file    Minutes per session: Not on file  . Stress: Not on file  Relationships  . Social Musicianconnections    Talks on phone: Not on file    Gets together: Not on file    Attends religious service: Not on file    Active member of club or organization: Not on file    Attends meetings of clubs or organizations: Not on file    Relationship status: Not on file  . Intimate partner violence    Fear of current or ex partner: Not on file    Emotionally abused: Not on file    Physically abused: Not on file    Forced sexual activity: Not on file  Other Topics Concern  . Not on file  Social History Narrative  . Not on file    Family History  Problem Relation Age of Onset  . Heart attack Mother 135  . Liver disease Father   . Heart attack Son        Age 69, he is our patient  . Anesthesia problems Neg Hx     Review of Systems:  As stated in the HPI and otherwise negative.   BP 140/72   Pulse 76   Ht 5' 0.25" (1.53 m)   Wt 159 lb 9.6 oz (72.4 kg)   SpO2 97%   BMI 30.91 kg/m   Physical Examination:  General: Well developed, well nourished, NAD  HEENT: OP clear, mucus membranes moist  SKIN: warm, dry. No rashes. Neuro: No focal deficits  Musculoskeletal: Muscle strength 5/5 all ext  Psychiatric: Mood and affect normal  Neck: No JVD, no carotid bruits, no thyromegaly, no lymphadenopathy.  Lungs:Clear bilaterally, no wheezes, rhonci, crackles Cardiovascular: Regular rate and rhythm. No murmurs, gallops or rubs. Abdomen:Soft. Bowel sounds present. Non-tender.  Extremities: No lower extremity edema. Pulses are 2 + in the bilateral  DP/PT.  Echo 02/07/16: Left ventricle: The cavity size was normal. Wall thickness was   normal. Systolic function was normal. The estimated ejection   fraction was in the range of 60% to  65%. Wall motion was normal;   there were no regional wall motion abnormalities. Doppler   parameters are consistent with abnormal left ventricular   relaxation (grade 1 diastolic dysfunction). - Aortic valve: There was mild regurgitation. Impressions: - Normal LV systolic function; grade 1 diastolic dysfunction; mild   AI; trace MR.  EKG:  EKG is ordered today. The ekg ordered today demonstrates NSR, rate 76 bpm.   Recent Labs: No results found for requested labs within last 8760 hours.   Lipid Panel No results found for: CHOL, TRIG, HDL, CHOLHDL, VLDL, LDLCALC, LDLDIRECT   Wt Readings from Last 3 Encounters:  02/18/19 159 lb 9.6 oz (72.4 kg)  10/07/17 161 lb 6.4 oz (73.2 kg)  02/24/16 158 lb (71.7 kg)     Other studies Reviewed: Additional studies/ records that were reviewed today include: . Review of the above records demonstrates:   Assessment and Plan:   1. CAD without angina:  She is known to have mild CAD by cath in 2004. Nuclear stress test September 2017 with no ischemia. Echo September 2017 with normal LV function.  She has no chest pain. Will continue ASA. She does not tolerate statins.    2. Aortic insufficiency: Mild by echo September 2017. Will repeat echo now  3. HTN: BP is well controlled at home. Continue hydralazine and Hyzaar.   4. Atrial tachycardia: Rare palpitations.   Current medicines are reviewed at length with the patient today.  The patient does not have concerns regarding medicines.  The following changes have been made:  no change  Labs/ tests ordered today include:   Orders Placed This Encounter  Procedures  . EKG 12-Lead  . ECHOCARDIOGRAM COMPLETE   Disposition:   FU with me in 12 months  Signed, Verne Carrow, MD 02/18/2019 12:24 PM     [facility] [address] [place], [place], [place]  [address] Phone: [phone]; Fax: [phone]

## 2019-02-18 ENCOUNTER — Ambulatory Visit: Payer: Federal, State, Local not specified - PPO | Admitting: Cardiovascular Disease

## 2019-02-18 ENCOUNTER — Encounter: Payer: Self-pay | Admitting: Cardiovascular Disease

## 2019-02-18 ENCOUNTER — Other Ambulatory Visit: Payer: Self-pay

## 2019-02-18 VITALS — BP 140/72 | HR 76 | Ht 60.25 in | Wt 159.6 lb

## 2019-02-18 DIAGNOSIS — I10 Essential (primary) hypertension: Secondary | ICD-10-CM

## 2019-02-18 DIAGNOSIS — I351 Nonrheumatic aortic (valve) insufficiency: Secondary | ICD-10-CM | POA: Diagnosis not present

## 2019-02-18 DIAGNOSIS — I2510 Atherosclerotic heart disease of native coronary artery without angina pectoris: Secondary | ICD-10-CM

## 2019-02-18 MED ORDER — HYDRALAZINE HCL 100 MG PO TABS: 100.0000 mg | ORAL_TABLET | Freq: Three times a day (TID) | ORAL | 3 refills | Status: DC

## 2019-02-18 NOTE — Patient Instructions (Signed)
Medication Instructions:  Your physician recommends that you continue on your current medications as directed. Please refer to the Current Medication list given to you today.  If you need a refill on your cardiac medications before your next appointment, please call your pharmacy.   Lab work: None If you have labs (blood work) drawn today and your tests are completely normal, you will receive your results only by: . MyChart Message (if you have MyChart) OR . A paper copy in the mail If you have any lab test that is abnormal or we need to change your treatment, we will call you to review the results.  Testing/Procedures: Your physician has requested that you have an echocardiogram. Echocardiography is a painless test that uses sound waves to create images of your heart. It provides your doctor with information about the size and shape of your heart and how well your heart's chambers and valves are working. This procedure takes approximately one hour. There are no restrictions for this procedure.    Follow-Up: At [facility], you and your health needs are our priority.  As part of our continuing mission to provide you with exceptional heart care, we have created designated Provider Care Teams.  These Care Teams include your primary Cardiologist (physician) and Advanced Practice Providers (APPs -  Physician Assistants and Nurse Practitioners) who all work together to provide you with the care you need, when you need it. You will need a follow up appointment in 12 months.  Please call our office 2 months in advance to schedule this appointment.  You may see Christopher McAlhany, MD or one of the following Advanced Practice Providers on your designated Care Team:   Brittainy Simmons, PA-C Dayna Dunn, PA-C . Michele Lenze, PA-C  Any Other Special Instructions Will Be Listed Below (If Applicable).    

## 2019-03-02 ENCOUNTER — Ambulatory Visit (HOSPITAL_COMMUNITY): Payer: Federal, State, Local not specified - PPO | Attending: Internal Medicine

## 2019-03-02 ENCOUNTER — Other Ambulatory Visit: Payer: Self-pay

## 2019-03-02 DIAGNOSIS — I351 Nonrheumatic aortic (valve) insufficiency: Secondary | ICD-10-CM | POA: Insufficient documentation

## 2019-04-24 ENCOUNTER — Telehealth: Payer: Self-pay | Admitting: Cardiovascular Disease

## 2019-04-24 NOTE — Telephone Encounter (Signed)
Patient states she has [employer] on 05/19/19. She would like Dr. Angelena Form to write a letter stating that she cannot go. She states she thinks it would cause her BP to rise and with her age, she is uncomfortable with going.

## 2019-04-24 NOTE — Telephone Encounter (Signed)
Will route to Dr. Angelena Form for review.

## 2019-04-27 NOTE — Telephone Encounter (Signed)
Can you let Ms. Alvarenga know that the courts do not accept issues like HTN or the Covid threat as reasons to avoid jury duty? I generally only write letters for jury duty exemption if there were recent surgical procedures or cardiac caths that would require staying at home. Gerald Stabs

## 2019-04-27 NOTE — Telephone Encounter (Signed)
Called patient and informed.  She states understanding and appreciation for call back in regard to this matter.

## 2019-07-03 ENCOUNTER — Ambulatory Visit: Payer: Federal, State, Local not specified - PPO

## 2019-07-11 ENCOUNTER — Ambulatory Visit: Payer: Federal, State, Local not specified - PPO | Attending: Internal Medicine

## 2019-07-11 DIAGNOSIS — Z23 Encounter for immunization: Secondary | ICD-10-CM

## 2019-07-11 NOTE — Progress Notes (Signed)
   Covid-19 Vaccination Clinic  Name:  Shirley Juarez    MRN: [id number] DOB: Aug 01, 1950  07/11/2019  Shirley Juarez was observed post Covid-19 immunization for 15 minutes without incidence. She was provided with Vaccine Information Sheet and instruction to access the V-Safe system.   Shirley Juarez was instructed to call 911 with any severe reactions post vaccine: Marland Kitchen Difficulty breathing  . Swelling of your face and throat  . A fast heartbeat  . A bad rash all over your body  . Dizziness and weakness    Immunizations Administered    Name Date Dose VIS Date Route   Pfizer COVID-19 Vaccine 07/11/2019  4:40 PM 0.3 mL 05/15/2019 Intramuscular   Manufacturer: [employer]   Lot: [id number]   NDC: [address]-1

## 2019-08-05 ENCOUNTER — Ambulatory Visit: Payer: Federal, State, Local not specified - PPO

## 2020-03-09 NOTE — Progress Notes (Signed)
Chief Complaint  Patient presents with   Follow-up    CAD   History of Present Illness: 69 yo female with history of HTN, HLD, CAD, DM, anxiety and depression who is here today for cardiac follow up. I saw her 01/31/16 to re-establish care in our office. She was known to have minor CAD by cath in 2004 in [place]. Her stress test in 2014 showed no ischemia. Echo in 2014 with normal LV function, mild AI. When I saw her August 2017, she had many complaints including headache, posterior neck pain, arm pain, chest pain at rest, elevated BP at home. She was admitted to [facility] 02/06/16 with neck pain radiating to her chest. Neck MRI with cervical spine disease. Nuclear stress test 02/07/16 with no ischemia. Echo 02/07/16 with normal LV size and function, mild AI. She does not tolerate Calcium channel blockers, Toprol, clonidine, Norvasc. Cardiac monitor June 2019 with several short runs of atrial tachycardia. Echo 03/02/19 with LVEF=65-70%, mild MR.   She is here today for follow up. The patient denies any chest pain, dyspnea, palpitations, lower extremity edema, orthopnea, PND, dizziness, near syncope or syncope.   Primary Care Physician: Shirlean Mylar, MD  Past Medical History:  Diagnosis Date   Anxiety    Arthritis    Osteoarthritis- knee and back   Coronary artery disease    Cath 2004 in [place], [place] with minor CAD   Depression    Diabetes mellitus    On oral and diet   GERD (gastroesophageal reflux disease)    Hyperlipidemia    Hypertension    Sleep apnea    does not use CPAP- it is broken.  Last sleep study was 7 years ago.  Has not worn in  4  years    Past Surgical History:  Procedure Laterality Date   ABDOMINAL HYSTERECTOMY     KNEE ARTHROCENTESIS     TOTAL KNEE ARTHROPLASTY  06/22/2011   Bilateral     Current Outpatient Medications  Medication Sig Dispense Refill   ALPRAZolam (XANAX) 0.5 MG tablet Take 0.5 mg by mouth 2 (two) times daily.       aspirin EC 81  MG tablet Take 81 mg by mouth at bedtime.       Calcium Citrate-Vitamin D (CALCIUM CITRATE + D3 PO) Take 1 tablet by mouth every morning. Take one tablet of calcium citrate 630 iu / 500 iu daily      cetirizine (ZYRTEC) 10 MG tablet Take 10 mg by mouth every morning.       estradiol (ESTRACE) 2 MG tablet Take 2 mg by mouth 2 (two) times a week.      gemfibrozil (LOPID) 600 MG tablet Take 600 mg by mouth 2 (two) times daily before a meal.      hydrALAZINE (APRESOLINE) 100 MG tablet Take 1 tablet (100 mg total) by mouth 3 (three) times daily. 270 tablet 3   hydrOXYzine (ATARAX/VISTARIL) 25 MG tablet Take 25 mg by mouth at bedtime.       lansoprazole (PREVACID) 30 MG capsule Take 30 mg by mouth daily.       losartan-hydrochlorothiazide (HYZAAR) 100-25 MG per tablet Take 1 tablet by mouth daily.       metFORMIN (GLUCOPHAGE) 500 MG tablet Take 500 mg by mouth in the morning and at bedtime.      triamcinolone cream (KENALOG) 0.1 % Apply 1 application topically 2 (two) times daily as needed (itching).      venlafaxine XR (  EFFEXOR-XR) 75 MG 24 hr capsule Take 75 mg by mouth at bedtime. Take one tablet daily      No current facility-administered medications for this visit.    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  Tobacco Use   Smoking status: Never Smoker   Smokeless tobacco: Never Used  Substance and Sexual  Activity   Alcohol use: No   Drug use: No   Sexual activity: Yes    Birth control/protection: Surgical  Other Topics Concern   Not on file  Social History Narrative   Not on file   Social Determinants of Health   Financial Resource Strain:    Difficulty of Paying Living Expenses: Not on file  Food Insecurity:    Worried About Programme researcher, broadcasting/film/video in the Last Year: Not on file   [employer] of Food in the Last Year: Not on file  Transportation Needs:    Lack of Transportation (Medical): Not on file   Lack of Transportation (Non-Medical): Not on file  Physical Activity:    Days of Exercise per Week: Not on file   Minutes of Exercise per Session: Not on file  Stress:    Feeling of Stress : Not on file  Social Connections:    Frequency of Communication with Friends and Family: Not on file   Frequency of Social Gatherings with Friends and Family: Not on file   Attends Religious Services: Not on file   Active Member of Clubs or Organizations: Not on file   Attends Banker Meetings: Not on file   Marital Status: Not on file  Intimate Partner Violence:    Fear of Current or Ex-Partner: Not on file   Emotionally Abused: Not on file   Physically Abused: Not on file   Sexually Abused: Not on file    Family History  Problem Relation Age of Onset   Heart attack Mother 79   Liver disease Father    Heart attack Son        Age 24, he is our patient   Anesthesia problems Neg Hx     Review of Systems:  As stated in the HPI and otherwise negative.   BP (!) 148/76    Pulse 87    Ht 5' 0.25" (1.53 m)    Wt 156 lb 13.6 oz (71.1 kg)    SpO2 97%    BMI 30.38 kg/m   Physical Examination:  General: Well developed, well nourished, NAD  HEENT: OP clear, mucus membranes moist  SKIN: warm, dry. No rashes. Neuro: No focal deficits  Musculoskeletal: Muscle strength 5/5 all ext  Psychiatric: Mood and affect normal  Neck: No JVD, no carotid bruits, no  thyromegaly, no lymphadenopathy.  Lungs:Clear bilaterally, no wheezes, rhonci, crackles Cardiovascular: Regular rate and rhythm. No murmurs, gallops or rubs. Abdomen:Soft. Bowel sounds present. Non-tender.  Extremities: No lower extremity edema. Pulses  are 2 + in the bilateral DP/PT.  Echo 03/02/19:  1. Left ventricular ejection fraction, by visual estimation, is 65 to  70%. The left ventricle has normal function. Normal left ventricular size.  There is no left ventricular hypertrophy.  2. Global right ventricle has normal systolic function.The right  ventricular size is normal. No increase in right ventricular wall  thickness.  3. The mitral valve is normal in structure. Mild mitral valve  regurgitation.   EKG:  EKG is ordered today. The ekg ordered today demonstrates sinus, incomplete RBBB, PACs  Recent Labs: No results found for requested labs within last 8760 hours.   Lipid Panel No results found for: CHOL, TRIG, HDL, CHOLHDL, VLDL, LDLCALC, LDLDIRECT   Wt Readings from Last 3 Encounters:  03/10/20 156 lb 13.6 oz (71.1 kg)  02/18/19 159 lb 9.6 oz (72.4 kg)  10/07/17 161 lb 6.4 oz (73.2 kg)     Other studies Reviewed: Additional studies/ records that were reviewed today include: . Review of the above records demonstrates:   Assessment and Plan:   1. CAD without angina:  She is known to have mild CAD by cath in 2004. Nuclear stress test September 2017 with no ischemia. Echo September 2020 with normal LV function.  She has no chest pain. Will continue ASA. She does not tolerate statins.    2. Aortic insufficiency: No significant AI by echo September 2020.   3. HTN: BP is controlled at home. No changes  4. History of Atrial tachycardia: No palpitations  Current medicines are reviewed at length with the patient today.  The patient does not have concerns regarding medicines.  The following changes have been made:  no change  Labs/ tests ordered today include:   No  orders of the defined types were placed in this encounter.  Disposition:   FU with me in 12 months  Signed, Verne Carrow, MD 03/10/2020 3:36 PM    [facility] [address] [place], [place], [place]  [address] Phone: [phone]; Fax: [phone]

## 2020-03-10 ENCOUNTER — Ambulatory Visit: Payer: Federal, State, Local not specified - PPO | Admitting: Cardiovascular Disease

## 2020-03-10 ENCOUNTER — Other Ambulatory Visit: Payer: Self-pay

## 2020-03-10 ENCOUNTER — Encounter: Payer: Self-pay | Admitting: Cardiovascular Disease

## 2020-03-10 VITALS — BP 148/76 | HR 87 | Ht 60.25 in | Wt 156.8 lb

## 2020-03-10 DIAGNOSIS — I351 Nonrheumatic aortic (valve) insufficiency: Secondary | ICD-10-CM

## 2020-03-10 DIAGNOSIS — I2510 Atherosclerotic heart disease of native coronary artery without angina pectoris: Secondary | ICD-10-CM

## 2020-03-10 DIAGNOSIS — I10 Essential (primary) hypertension: Secondary | ICD-10-CM | POA: Diagnosis not present

## 2020-03-10 NOTE — Patient Instructions (Signed)
Medication Instructions:  Your physician recommends that you continue on your current medications as directed. Please refer to the Current Medication list given to you today.  *If you need a refill on your cardiac medications before your next appointment, please call your pharmacy*  Follow-Up: At [facility], you and your health needs are our priority.  As part of our continuing mission to provide you with exceptional heart care, we have created designated Provider Care Teams.  These Care Teams include your primary Cardiologist (physician) and Advanced Practice Providers (APPs -  Physician Assistants and Nurse Practitioners) who all work together to provide you with the care you need, when you need it.  Your next appointment:   1 year(s)  The format for your next appointment:   In Person  Provider:   You may see Christopher McAlhany, MD or one of the following Advanced Practice Providers on your designated Care Team:    Dayna Dunn, PA-C  Michele Lenze, PA-C    

## 2020-04-01 ENCOUNTER — Other Ambulatory Visit: Payer: Self-pay | Admitting: Cardiovascular Disease

## 2020-07-21 ENCOUNTER — Ambulatory Visit: Payer: Federal, State, Local not specified - PPO | Admitting: Cardiovascular Disease

## 2020-11-23 ENCOUNTER — Telehealth: Payer: Self-pay | Admitting: Cardiovascular Disease

## 2020-11-23 NOTE — Telephone Encounter (Signed)
Patient states she is returning a call from today. I did not see any notes. 

## 2020-11-23 NOTE — Telephone Encounter (Signed)
Spoke with patient and informed there is no record of anyone calling or needing to speak with her that I can see.

## 2021-01-10 ENCOUNTER — Other Ambulatory Visit (HOSPITAL_BASED_OUTPATIENT_CLINIC_OR_DEPARTMENT_OTHER): Payer: Self-pay | Admitting: Family Medicine

## 2021-01-10 ENCOUNTER — Other Ambulatory Visit: Payer: Self-pay

## 2021-01-10 ENCOUNTER — Ambulatory Visit (HOSPITAL_BASED_OUTPATIENT_CLINIC_OR_DEPARTMENT_OTHER)
Admission: RE | Admit: 2021-01-10 | Discharge: 2021-01-10 | Disposition: A | Discharge status: Home / Self Care | Payer: Federal, State, Local not specified - PPO | Source: Ambulatory Visit | Attending: Family Medicine | Admitting: Family Medicine

## 2021-01-10 ENCOUNTER — Ambulatory Visit (HOSPITAL_BASED_OUTPATIENT_CLINIC_OR_DEPARTMENT_OTHER): Admission: RE | Admit: 2021-01-10 | Payer: Federal, State, Local not specified - PPO | Source: Ambulatory Visit

## 2021-01-10 DIAGNOSIS — R399 Unspecified symptoms and signs involving the genitourinary system: Secondary | ICD-10-CM | POA: Insufficient documentation

## 2021-01-10 DIAGNOSIS — R1909 Other intra-abdominal and pelvic swelling, mass and lump: Secondary | ICD-10-CM | POA: Insufficient documentation

## 2021-01-12 ENCOUNTER — Observation Stay (HOSPITAL_COMMUNITY)
Admission: EM | Admit: 2021-01-12 | Discharge: 2021-01-13 | Disposition: A | Discharge status: Home / Self Care | Payer: Federal, State, Local not specified - PPO | Attending: Internal Medicine | Admitting: Internal Medicine

## 2021-01-12 ENCOUNTER — Other Ambulatory Visit: Payer: Self-pay

## 2021-01-12 ENCOUNTER — Encounter (HOSPITAL_COMMUNITY): Payer: Self-pay

## 2021-01-12 DIAGNOSIS — Z79899 Other long term (current) drug therapy: Secondary | ICD-10-CM | POA: Insufficient documentation

## 2021-01-12 DIAGNOSIS — N179 Acute kidney failure, unspecified: Secondary | ICD-10-CM | POA: Diagnosis not present

## 2021-01-12 DIAGNOSIS — I2510 Atherosclerotic heart disease of native coronary artery without angina pectoris: Secondary | ICD-10-CM | POA: Diagnosis present

## 2021-01-12 DIAGNOSIS — Z7984 Long term (current) use of oral hypoglycemic drugs: Secondary | ICD-10-CM | POA: Insufficient documentation

## 2021-01-12 DIAGNOSIS — Z91040 Latex allergy status: Secondary | ICD-10-CM | POA: Insufficient documentation

## 2021-01-12 DIAGNOSIS — E119 Type 2 diabetes mellitus without complications: Secondary | ICD-10-CM | POA: Diagnosis not present

## 2021-01-12 DIAGNOSIS — Z91010 Allergy to peanuts: Secondary | ICD-10-CM | POA: Diagnosis not present

## 2021-01-12 DIAGNOSIS — Z20822 Contact with and (suspected) exposure to covid-19: Secondary | ICD-10-CM | POA: Diagnosis not present

## 2021-01-12 DIAGNOSIS — Z7982 Long term (current) use of aspirin: Secondary | ICD-10-CM | POA: Insufficient documentation

## 2021-01-12 DIAGNOSIS — E876 Hypokalemia: Secondary | ICD-10-CM | POA: Diagnosis not present

## 2021-01-12 DIAGNOSIS — R339 Retention of urine, unspecified: Secondary | ICD-10-CM | POA: Diagnosis not present

## 2021-01-12 DIAGNOSIS — I10 Essential (primary) hypertension: Secondary | ICD-10-CM | POA: Diagnosis not present

## 2021-01-12 DIAGNOSIS — I160 Hypertensive urgency: Secondary | ICD-10-CM | POA: Insufficient documentation

## 2021-01-12 DIAGNOSIS — N390 Urinary tract infection, site not specified: Secondary | ICD-10-CM

## 2021-01-12 DIAGNOSIS — R350 Frequency of micturition: Secondary | ICD-10-CM | POA: Diagnosis present

## 2021-01-12 DIAGNOSIS — D649 Anemia, unspecified: Secondary | ICD-10-CM | POA: Insufficient documentation

## 2021-01-12 DIAGNOSIS — N3592 Unspecified urethral stricture, female: Secondary | ICD-10-CM | POA: Insufficient documentation

## 2021-01-12 DIAGNOSIS — Z96653 Presence of artificial knee joint, bilateral: Secondary | ICD-10-CM | POA: Insufficient documentation

## 2021-01-12 LAB — CREATININE, SERUM
Creatinine, Ser: 1.7 mg/dL — ABNORMAL HIGH (ref 0.44–1.00)
GFR, Estimated: 32 mL/min — ABNORMAL LOW (ref >60)

## 2021-01-12 LAB — URINALYSIS, ROUTINE W REFLEX MICROSCOPIC
Bacteria, UA: NONE SEEN
Bilirubin Urine: NEGATIVE
Glucose, UA: NEGATIVE mg/dL
Ketones, ur: NEGATIVE mg/dL
Nitrite: NEGATIVE
Protein, ur: NEGATIVE mg/dL
Specific Gravity, Urine: 1.009 (ref 1.005–1.030)
WBC, UA: >50 WBC/hpf — ABNORMAL HIGH (ref 0–5)
pH: 8 (ref 5.0–8.0)

## 2021-01-12 LAB — CBC
HCT: 26.6 % — ABNORMAL LOW (ref 36.0–46.0)
Hemoglobin: 8.6 g/dL — ABNORMAL LOW (ref 12.0–15.0)
MCH: 24.5 pg — ABNORMAL LOW (ref 26.0–34.0)
MCHC: 32.3 g/dL (ref 30.0–36.0)
MCV: 75.8 fL — ABNORMAL LOW (ref 80.0–100.0)
Platelets: 407 10*3/uL — ABNORMAL HIGH (ref 150–400)
RBC: 3.51 MIL/uL — ABNORMAL LOW (ref 3.87–5.11)
RDW: 16.5 % — ABNORMAL HIGH (ref 11.5–15.5)
WBC: 4.1 10*3/uL (ref 4.0–10.5)
nRBC: 0 % (ref 0.0–0.2)

## 2021-01-12 LAB — RESP PANEL BY RT-PCR (FLU A&B, COVID) ARPGX2
Influenza A by PCR: NEGATIVE
Influenza B by PCR: NEGATIVE
SARS Coronavirus 2 by RT PCR: NEGATIVE

## 2021-01-12 LAB — BASIC METABOLIC PANEL
Anion gap: 13 (ref 5–15)
BUN: 41 mg/dL — ABNORMAL HIGH (ref 8–23)
CO2: 24 mmol/L (ref 22–32)
Calcium: 9.4 mg/dL (ref 8.9–10.3)
Chloride: 103 mmol/L (ref 98–111)
Creatinine, Ser: 1.98 mg/dL — ABNORMAL HIGH (ref 0.44–1.00)
GFR, Estimated: 27 mL/min — ABNORMAL LOW (ref >60)
Glucose, Bld: 130 mg/dL — ABNORMAL HIGH (ref 70–99)
Potassium: 3.2 mmol/L — ABNORMAL LOW (ref 3.5–5.1)
Sodium: 140 mmol/L (ref 135–145)

## 2021-01-12 LAB — VITAMIN B12: Vitamin B-12: 150 pg/mL — ABNORMAL LOW (ref 180–914)

## 2021-01-12 LAB — CBC WITH DIFFERENTIAL/PLATELET
Abs Immature Granulocytes: 0.01 10*3/uL (ref 0.00–0.07)
Basophils Absolute: 0 10*3/uL (ref 0.0–0.1)
Basophils Relative: 1 %
Eosinophils Absolute: 0.1 10*3/uL (ref 0.0–0.5)
Eosinophils Relative: 3 %
HCT: 24.8 % — ABNORMAL LOW (ref 36.0–46.0)
Hemoglobin: 8 g/dL — ABNORMAL LOW (ref 12.0–15.0)
Immature Granulocytes: 0 %
Lymphocytes Relative: 25 %
Lymphs Abs: 0.9 10*3/uL (ref 0.7–4.0)
MCH: 24.6 pg — ABNORMAL LOW (ref 26.0–34.0)
MCHC: 32.3 g/dL (ref 30.0–36.0)
MCV: 76.3 fL — ABNORMAL LOW (ref 80.0–100.0)
Monocytes Absolute: 0.4 10*3/uL (ref 0.1–1.0)
Monocytes Relative: 11 %
Neutro Abs: 2.2 10*3/uL (ref 1.7–7.7)
Neutrophils Relative %: 60 %
Platelets: 357 10*3/uL (ref 150–400)
RBC: 3.25 MIL/uL — ABNORMAL LOW (ref 3.87–5.11)
RDW: 16.4 % — ABNORMAL HIGH (ref 11.5–15.5)
WBC: 3.7 10*3/uL — ABNORMAL LOW (ref 4.0–10.5)
nRBC: 0 % (ref 0.0–0.2)

## 2021-01-12 LAB — IRON AND TIBC
Iron: 23 ug/dL — ABNORMAL LOW (ref 28–170)
Saturation Ratios: 4 % — ABNORMAL LOW (ref 10.4–31.8)
TIBC: 544 ug/dL — ABNORMAL HIGH (ref 250–450)
UIBC: 521 ug/dL

## 2021-01-12 LAB — CBG MONITORING, ED: Glucose-Capillary: 129 mg/dL — ABNORMAL HIGH (ref 70–99)

## 2021-01-12 LAB — RETICULOCYTES
Immature Retic Fract: 5.8 % (ref 2.3–15.9)
RBC.: 3.21 MIL/uL — ABNORMAL LOW (ref 3.87–5.11)
Retic Count, Absolute: 39.5 10*3/uL (ref 19.0–186.0)
Retic Ct Pct: 1.2 % (ref 0.4–3.1)

## 2021-01-12 LAB — FOLATE: Folate: 12.8 ng/mL (ref >5.9)

## 2021-01-12 LAB — FERRITIN: Ferritin: 23 ng/mL (ref 11–307)

## 2021-01-12 MED ORDER — ACETAMINOPHEN 650 MG RE SUPP: 650.0000 mg | Freq: Four times a day (QID) | RECTAL | Status: DC | PRN

## 2021-01-12 MED ORDER — HYDRALAZINE HCL 50 MG PO TABS
100.0000 mg | ORAL_TABLET | Freq: Three times a day (TID) | ORAL | Status: DC
  Administered 2021-01-12 – 2021-01-13 (×2): 100 mg via ORAL
  Filled 2021-01-12 (×2): qty 2

## 2021-01-12 MED ORDER — ACETAMINOPHEN 325 MG PO TABS
650.0000 mg | ORAL_TABLET | Freq: Four times a day (QID) | ORAL | Status: DC | PRN
  Administered 2021-01-13: 650 mg via ORAL
  Filled 2021-01-12: qty 2

## 2021-01-12 MED ORDER — PANTOPRAZOLE SODIUM 20 MG PO TBEC
20.0000 mg | DELAYED_RELEASE_TABLET | Freq: Every day | ORAL | Status: DC
  Administered 2021-01-13: 20 mg via ORAL
  Filled 2021-01-12: qty 1

## 2021-01-12 MED ORDER — GEMFIBROZIL 600 MG PO TABS
600.0000 mg | ORAL_TABLET | Freq: Two times a day (BID) | ORAL | Status: DC
  Filled 2021-01-12 (×2): qty 1

## 2021-01-12 MED ORDER — LACTATED RINGERS IV SOLN: INTRAVENOUS | Status: DC

## 2021-01-12 MED ORDER — HYDRALAZINE HCL 20 MG/ML IJ SOLN
10.0000 mg | INTRAMUSCULAR | Status: DC | PRN
  Administered 2021-01-12: 10 mg via INTRAVENOUS
  Filled 2021-01-12: qty 1

## 2021-01-12 MED ORDER — HYDROXYZINE HCL 25 MG PO TABS
25.0000 mg | ORAL_TABLET | Freq: Every day | ORAL | Status: DC
  Administered 2021-01-12: 25 mg via ORAL
  Filled 2021-01-12: qty 1

## 2021-01-12 MED ORDER — SODIUM CHLORIDE 0.9 % IV SOLN
1.0000 g | INTRAVENOUS | Status: DC
  Filled 2021-01-12: qty 10

## 2021-01-12 MED ORDER — VENLAFAXINE HCL ER 75 MG PO CP24: 75.0000 mg | ORAL_CAPSULE | Freq: Every day | ORAL | Status: DC

## 2021-01-12 MED ORDER — SODIUM CHLORIDE 0.9 % IV SOLN
1.0000 g | Freq: Once | INTRAVENOUS | Status: AC
  Administered 2021-01-12: 1 g via INTRAVENOUS
  Filled 2021-01-12: qty 10

## 2021-01-12 MED ORDER — INSULIN ASPART 100 UNIT/ML IJ SOLN
0.0000 [IU] | Freq: Three times a day (TID) | INTRAMUSCULAR | Status: DC
  Administered 2021-01-13: 2 [IU] via SUBCUTANEOUS
  Administered 2021-01-13: 1 [IU] via SUBCUTANEOUS
  Filled 2021-01-12: qty 0.09

## 2021-01-12 MED ORDER — AMLODIPINE BESYLATE 5 MG PO TABS
5.0000 mg | ORAL_TABLET | Freq: Once | ORAL | Status: AC
  Administered 2021-01-12: 5 mg via ORAL
  Filled 2021-01-12: qty 1

## 2021-01-12 MED ORDER — HEPARIN SODIUM (PORCINE) 5000 UNIT/ML IJ SOLN
5000.0000 [IU] | Freq: Three times a day (TID) | INTRAMUSCULAR | Status: DC
  Administered 2021-01-12 – 2021-01-13 (×3): 5000 [IU] via SUBCUTANEOUS
  Filled 2021-01-12 (×3): qty 1

## 2021-01-12 MED ORDER — ALPRAZOLAM 0.5 MG PO TABS
0.5000 mg | ORAL_TABLET | Freq: Two times a day (BID) | ORAL | Status: DC
  Administered 2021-01-12 – 2021-01-13 (×2): 0.5 mg via ORAL
  Filled 2021-01-12 (×2): qty 1

## 2021-01-12 MED ORDER — VENLAFAXINE HCL ER 150 MG PO CP24
150.0000 mg | ORAL_CAPSULE | Freq: Every day | ORAL | Status: DC
  Administered 2021-01-12: 150 mg via ORAL
  Filled 2021-01-12: qty 2

## 2021-01-12 MED ORDER — ASPIRIN EC 81 MG PO TBEC: 81.0000 mg | DELAYED_RELEASE_TABLET | Freq: Every day | ORAL | Status: DC

## 2021-01-12 NOTE — ED Notes (Signed)
Unable to place foley catheter.  Resistance was hit immediately upon inserting the tip into the urethra.  Second nurse tried and experience the same.  Weber Cooks made aware and will consult urology.

## 2021-01-12 NOTE — ED Provider Notes (Signed)
Emergency Medicine Provider Triage Evaluation Note  Shirley Juarez , a 70 y.o. female  was evaluated in triage.  Pt complains of abnormal ct scan. She states that she has a mass laying on her bladder. She reports urinary frequency.  Review of Systems  Positive: Urinary frequency Negative: fevers  Physical Exam  BP (!) 158/71 (BP Location: Left Arm)   Pulse 81   Temp 98 F (36.7 C) (Oral)   Resp 18   Ht 5' (1.524 m)   Wt 71 kg   SpO2 98%   BMI 30.57 kg/m  Gen:   Awake, no distress   Resp:  Normal effort  MSK:   Moves extremities without difficulty   Medical Decision Making  Medically screening exam initiated at 2:47 PM.  Appropriate orders placed.  HLEE FRINGER was informed that the remainder of the evaluation will be completed by another provider, this initial triage assessment does not replace that evaluation, and the importance of remaining in the ED until their evaluation is complete.     Rayne Du 01/12/21 1447    Glendora Score, MD 01/12/21 1805

## 2021-01-12 NOTE — ED Provider Notes (Signed)
[facility]-EMERGENCY DEPT Provider Note   CSN: [id number] Arrival date & time: 01/12/21  1419     History Chief Complaint  Patient presents with   Urinary Frequency   abnormal test    Shirley Juarez is a 70 y.o. female.   Urinary Frequency   Patient states she has been having issues with urinary frequency.  She states she is having to urinate all the time..  Patient denies any fevers or chills.  No vomiting or diarrhea.  Patient states she went to her doctor's office.  She had a CAT scan today.  She was told that she had some sort of mass and she needed to come to the ED.  Denies any fevers or chills.  She denies any blood in her stool.  She is not sure if she is had any blood in her urine.  Past Medical History:  Diagnosis Date   Anxiety    Arthritis    Osteoarthritis- knee and back   Coronary artery disease    Cath 2004 in [place], [place] with minor CAD   Depression    Diabetes mellitus    On oral and diet   GERD (gastroesophageal reflux disease)    Hyperlipidemia    Hypertension    Sleep apnea    does not use CPAP- it is broken.  Last sleep study was 7 years ago.  Has not worn in  4  years    Patient Active Problem List   Diagnosis Date Noted   DM type 2 goal A1C below 7.5    Headache    Hypokalemia    Pain in the chest 02/06/2016   Hypercalcemia 02/06/2016   GERD (gastroesophageal reflux disease) 02/06/2016   Coronary atherosclerosis of native coronary artery 09/04/2012   Essential hypertension 09/04/2012   Hyperlipidemia 09/04/2012    Past Surgical History:  Procedure Laterality Date   ABDOMINAL HYSTERECTOMY     KNEE ARTHROCENTESIS     TOTAL KNEE ARTHROPLASTY  06/22/2011   Bilateral      OB History   No obstetric history on file.     Family History  Problem Relation Age of Onset   Heart attack Mother 71   Liver disease Father    Heart attack Son        Age 36, he is our patient   Anesthesia problems Neg Hx     Social  History   Tobacco Use   Smoking status: Never   Smokeless tobacco: Never  Substance Use Topics   Alcohol use: No   Drug use: No    Home Medications Prior to Admission medications   Medication Sig Start Date End Date Taking? Authorizing Provider  ALPRAZolam Prudy Feeler) 0.5 MG tablet Take 0.5 mg by mouth 2 (two) times daily.      [provider]  aspirin EC 81 MG tablet Take 81 mg by mouth at bedtime.      [provider]  Calcium Citrate-Vitamin D (CALCIUM CITRATE + D3 PO) Take 1 tablet by mouth every morning. Take one tablet of calcium citrate 630 iu / 500 iu daily     [provider]  cetirizine (ZYRTEC) 10 MG tablet Take 10 mg by mouth every morning.      [provider]  estradiol (ESTRACE) 2 MG tablet Take 2 mg by mouth 2 (two) times a week.     [provider]  gemfibrozil (LOPID) 600 MG tablet Take 600 mg by mouth 2 (two) times  daily before a meal.     [provider]  hydrALAZINE (APRESOLINE) 100 MG tablet TAKE ONE TABLET BY MOUTH THREE TIMES A DAY 04/04/20   Kathleene Hazel, MD  hydrOXYzine (ATARAX/VISTARIL) 25 MG tablet Take 25 mg by mouth at bedtime.      [provider]  lansoprazole (PREVACID) 30 MG capsule Take 30 mg by mouth daily.      [provider]  losartan-hydrochlorothiazide (HYZAAR) 100-25 MG per tablet Take 1 tablet by mouth daily.      [provider]  metFORMIN (GLUCOPHAGE) 500 MG tablet Take 500 mg by mouth in the morning and at bedtime.     [provider]  triamcinolone cream (KENALOG) 0.1 % Apply 1 application topically 2 (two) times daily as needed (itching).     [provider]  venlafaxine XR (EFFEXOR-XR) 75 MG 24 hr capsule Take 75 mg by mouth at bedtime. Take one tablet daily  09/02/12   [provider]    Allergies    Latex, Codeine, Crestor [rosuvastatin calcium], Fish oil, Lipitor [atorvastatin calcium], Peanut-containing drug products, Welchol  [colesevelam hcl], and Zithromax [azithromycin]  Review of Systems   Review of Systems  Gastrointestinal:  Negative for blood in stool.  Genitourinary:  Positive for frequency.  All other systems reviewed and are negative.  Physical Exam Updated Vital Signs BP (!) 206/79   Pulse 75   Temp 98 F (36.7 C) (Oral)   Resp 14   Ht 1.524 m (5')   Wt 71 kg   SpO2 98%   BMI 30.57 kg/m   Physical Exam Vitals and nursing note reviewed.  Constitutional:      General: She is not in acute distress.    Appearance: She is well-developed.  HENT:     Head: Normocephalic and atraumatic.     Right Ear: External ear normal.     Left Ear: External ear normal.  Eyes:     General: No scleral icterus.       Right eye: No discharge.        Left eye: No discharge.     Conjunctiva/sclera: Conjunctivae normal.  Neck:     Trachea: No tracheal deviation.  Cardiovascular:     Rate and Rhythm: Normal rate and regular rhythm.  Pulmonary:     Effort: Pulmonary effort is normal. No respiratory distress.     Breath sounds: Normal breath sounds. No stridor. No wheezing or rales.  Abdominal:     General: Bowel sounds are normal. There is no distension.     Palpations: Abdomen is soft. There is mass.     Tenderness: There is no abdominal tenderness. There is no guarding or rebound.     Comments: Suprapubic region  Musculoskeletal:        General: No tenderness or deformity.     Cervical back: Neck supple.  Skin:    General: Skin is warm and dry.     Findings: No rash.  Neurological:     General: No focal deficit present.     Mental Status: She is alert.     Cranial Nerves: No cranial nerve deficit (no facial droop, extraocular movements intact, no slurred speech).     Sensory: No sensory deficit.     Motor: No abnormal muscle tone or seizure activity.     Coordination: Coordination normal.  Psychiatric:        Mood and Affect: Mood normal.    ED Results / Procedures / Treatments  Labs (all  labs ordered are listed, but only abnormal results are displayed) Labs Reviewed  CBC WITH DIFFERENTIAL/PLATELET - Abnormal; Notable for the following components:      Result Value   WBC 3.7 (*)    RBC 3.25 (*)    Hemoglobin 8.0 (*)    HCT 24.8 (*)    MCV 76.3 (*)    MCH 24.6 (*)    RDW 16.4 (*)    All other components within normal limits  BASIC METABOLIC PANEL - Abnormal; Notable for the following components:   Potassium 3.2 (*)    Glucose, Bld 130 (*)    BUN 41 (*)    Creatinine, Ser 1.98 (*)    GFR, Estimated 27 (*)    All other components within normal limits  URINALYSIS, ROUTINE W REFLEX MICROSCOPIC - Abnormal; Notable for the following components:   APPearance HAZY (*)    Hgb urine dipstick SMALL (*)    Leukocytes,Ua LARGE (*)    WBC, UA >50 (*)    Non Squamous Epithelial 0-5 (*)    All other components within normal limits  VITAMIN B12 - Abnormal; Notable for the following components:   Vitamin B-12 150 (*)    All other components within normal limits  IRON AND TIBC - Abnormal; Notable for the following components:   Iron 23 (*)    TIBC 544 (*)    Saturation Ratios 4 (*)    All other components within normal limits  RETICULOCYTES - Abnormal; Notable for the following components:   RBC. 3.21 (*)    All other components within normal limits  FERRITIN  FOLATE    EKG None  Radiology No results found.  Procedures Procedures   Medications Ordered in ED Medications  amLODipine (NORVASC) tablet 5 mg (has no administration in time range)    ED Course  I have reviewed the triage vital signs and the nursing notes.  Pertinent labs & imaging results that were available during my care of the patient were reviewed by me and considered in my medical decision making (see chart for details).  Clinical Course as of 01/12/21 1959  Thu Jan 12, 2021  1818 Nursing staff unable to place a foley catheter.  Meeting resistance within the urethra.  Will consult with  urology.  [JK]  1818 Discussed with Dr Benancio Deeds.  Will see pt in the ED.  Urology cart ordered [JK]    Clinical Course User Index [JK] Linwood Dibbles, MD   MDM Rules/Calculators/A&P                          See scanned findings reviewed.  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  Will insert a Foley catheter for her urinary retention.  Anemia also noted on her blood test.  Patient denies having a history of anemia.  I do not have any old labs for comparison other than several years ago.  MCV is low suggesting chronic component.  We will send off iron panel.  No symptoms to suggest GI bleeding.  Patient will require outpatient follow-up.  She has some elevation in her BUN and creatinine.  This is likely related to the urinary retention.   Patient was seen by Dr. Benancio Deeds in the ED and he was eventually able to place a Foley catheter after dilating the patient's urethra.  He did have a stricture that  was impeding the nurses ability to place a catheter.  Patient does have evidence of AKI.  We will plan on admitting her to the hospital for observation to make sure her creatinine returns to normal after the Foley placement.  I will consult with the medical service.  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

## 2021-01-12 NOTE — ED Notes (Signed)
ED TO INPATIENT HANDOFF REPORT  ED Nurse Name and Phone #: [phone], Charise Carwin, RN   S Name/Age/Gender Shirley Juarez 70 y.o. female Room/Bed: WA20/WA20  Code Status   Code Status: Full Code  Home/SNF/Other Home Patient oriented to: self, place, time and situation Is this baseline? Yes   Triage Complete: Triage complete  Chief Complaint ARF (acute renal failure) (HCC) [N17.9]  Triage Note Patient went to PCP for frequent urination and CT scan was performed and referred to ED for mass on the bladder.     Allergies Allergies  Allergen Reactions  . Latex Rash  . Codeine Nausea And Vomiting  . Crestor [Rosuvastatin Calcium] Nausea And Vomiting  . Fish Oil Nausea And Vomiting  . Hydralazine Other (See Comments) and Cough    100 mg three times a day causes excessive coughing  . Lipitor [Atorvastatin Calcium] Nausea And Vomiting  . Peanut-Containing Drug Products Hives  . Statins Nausea And Vomiting  . Welchol [Colesevelam Hcl] Nausea And Vomiting  . Zithromax [Azithromycin] Nausea And Vomiting  . Other Other (See Comments)    Pet dander    Level of Care/Admitting Diagnosis ED Disposition    ED Disposition  Admit   Condition  --   Comment  Hospital Area: [facility] [[id number]]  Level of Care: Telemetry [5]  Admit to tele based on following criteria: Monitor QTC interval  May place patient in observation at [facility] or [facility] if equivalent level of care is available:: No  Covid Evaluation: Asymptomatic Screening Protocol (No Symptoms)  Diagnosis: ARF (acute renal failure) [facility]) [[id number]]  Admitting Physician: Eduard Clos [phone]  Attending Physician: Eduard Clos Florian.Pax         B Medical/Surgery History Past Medical History:  Diagnosis Date  . Anxiety   . Arthritis    Osteoarthritis- knee and back  . Coronary artery disease    Cath 2004 in [place], [place] with minor CAD  . Depression   . Diabetes mellitus    On  oral and diet  . GERD (gastroesophageal reflux disease)   . Hyperlipidemia   . Hypertension   . Sleep apnea    does not use CPAP- it is broken.  Last sleep study was 7 years ago.  Has not worn in  4  years   Past Surgical History:  Procedure Laterality Date  . ABDOMINAL HYSTERECTOMY    . KNEE ARTHROCENTESIS    . TOTAL KNEE ARTHROPLASTY  06/22/2011   Bilateral      A IV Location/Drains/Wounds Patient Lines/Drains/Airways Status    Active Line/Drains/Airways    Name Placement date Placement time Site Days   Peripheral IV 01/12/21 20 G Left Antecubital 01/12/21  2107  Antecubital  less than 1   Urethral Catheter [place], MD Latex 20 Fr. 01/12/21  1907  Latex  less than 1   Incision 06/22/11 Leg Right 06/22/11  0805  -- 3492          Intake/Output Last 24 hours  Intake/Output Summary (Last 24 hours) at 01/12/2021 2235 Last data filed at 01/12/2021 2203 Gross per 24 hour  Intake 100 ml  Output 3000 ml  Net -2900 ml    Labs/Imaging Results for orders placed or performed during the hospital encounter of 01/12/21 (from the past 48 hour(s))  Urinalysis, Routine w reflex microscopic     Status: Abnormal   Collection Time: 01/12/21  2:48 PM  Result Value Ref Range   Color, Urine YELLOW YELLOW  APPearance HAZY (A) CLEAR   Specific Gravity, Urine 1.009 1.005 - 1.030   pH 8.0 5.0 - 8.0   Glucose, UA NEGATIVE NEGATIVE mg/dL   Hgb urine dipstick SMALL (A) NEGATIVE   Bilirubin Urine NEGATIVE NEGATIVE   Ketones, ur NEGATIVE NEGATIVE mg/dL   Protein, ur NEGATIVE NEGATIVE mg/dL   Nitrite NEGATIVE NEGATIVE   Leukocytes,Ua LARGE (A) NEGATIVE   RBC / HPF 6-10 0 - 5 RBC/hpf   WBC, UA >50 (H) 0 - 5 WBC/hpf   Bacteria, UA NONE SEEN NONE SEEN   Squamous Epithelial / LPF 0-5 0 - 5   Mucus PRESENT    Non Squamous Epithelial 0-5 (A) NONE SEEN    Comment: Performed at [facility], [address] [place], [place] [address]  CBC with Differential     Status: Abnormal    Collection Time: 01/12/21  2:56 PM  Result Value Ref Range   WBC 3.7 (L) 4.0 - 10.5 K/uL   RBC 3.25 (L) 3.87 - 5.11 MIL/uL   Hemoglobin 8.0 (L) 12.0 - 15.0 g/dL    Comment: Reticulocyte Hemoglobin testing may be clinically indicated, consider ordering this additional test [id number]    HCT 24.8 (L) 36.0 - 46.0 %   MCV 76.3 (L) 80.0 - 100.0 fL   MCH 24.6 (L) 26.0 - 34.0 pg   MCHC 32.3 30.0 - 36.0 g/dL   RDW 16.0 (H) 10.9 - 32.3 %   Platelets 357 150 - 400 K/uL   nRBC 0.0 0.0 - 0.2 %   Neutrophils Relative % 60 %   Neutro Abs 2.2 1.7 - 7.7 K/uL   Lymphocytes Relative 25 %   Lymphs Abs 0.9 0.7 - 4.0 K/uL   Monocytes Relative 11 %   Monocytes Absolute 0.4 0.1 - 1.0 K/uL   Eosinophils Relative 3 %   Eosinophils Absolute 0.1 0.0 - 0.5 K/uL   Basophils Relative 1 %   Basophils Absolute 0.0 0.0 - 0.1 K/uL   Immature Granulocytes 0 %   Abs Immature Granulocytes 0.01 0.00 - 0.07 K/uL    Comment: Performed at [facility], [address] [place], [place] [address]  Basic metabolic panel     Status: Abnormal   Collection Time: 01/12/21  2:56 PM  Result Value Ref Range   Sodium 140 135 - 145 mmol/L   Potassium 3.2 (L) 3.5 - 5.1 mmol/L   Chloride 103 98 - 111 mmol/L   CO2 24 22 - 32 mmol/L   Glucose, Bld 130 (H) 70 - 99 mg/dL    Comment: Glucose reference range applies only to samples taken after fasting for at least 8 hours.   BUN 41 (H) 8 - 23 mg/dL   Creatinine, Ser 2.02 (H) 0.44 - 1.00 mg/dL   Calcium 9.4 8.9 - 54.2 mg/dL   GFR, Estimated 27 (L) >60 mL/min    Comment: (NOTE) Calculated using the CKD-EPI Creatinine Equation (2021)    Anion gap 13 5 - 15    Comment: Performed at [facility], [address] [place], [place] [address]  Reticulocytes     Status: Abnormal   Collection Time: 01/12/21  2:56 PM  Result Value Ref Range   Retic Ct Pct 1.2 0.4 - 3.1 %   RBC. 3.21 (L) 3.87 - 5.11 MIL/uL   Retic Count, Absolute 39.5 19.0 - 186.0  K/uL   Immature Retic Fract 5.8 2.3 - 15.9 %    Comment: Performed at [facility], [address], [place], [place] [address]  Vitamin B12     Status: Abnormal   Collection Time: 01/12/21  4:52 PM  Result Value Ref Range   Vitamin B-12 150 (L) 180 - 914 pg/mL    Comment: (NOTE) This assay is not validated for testing neonatal or myeloproliferative syndrome specimens for Vitamin B12 levels. Performed at [facility], [address] [place], [place] [address]   Folate     Status: None   Collection Time: 01/12/21  4:52 PM  Result Value Ref Range   Folate 12.8 >5.9 ng/mL    Comment: Performed at [facility], [address] [place], [place] [address]  Iron and TIBC     Status: Abnormal   Collection Time: 01/12/21  4:52 PM  Result Value Ref Range   Iron 23 (L) 28 - 170 ug/dL   TIBC 914 (H) 782 - 956 ug/dL   Saturation Ratios 4 (L) 10.4 - 31.8 %   UIBC 521 ug/dL    Comment: Performed at [facility], [address], [place], [place] [address]  Ferritin     Status: None   Collection Time: 01/12/21  4:52 PM  Result Value Ref Range   Ferritin 23 11 - 307 ng/mL    Comment: Performed at [facility], [address], [place], [place] [address]  CBC     Status: Abnormal   Collection Time: 01/12/21  9:09 PM  Result Value Ref Range   WBC 4.1 4.0 - 10.5 K/uL   RBC 3.51 (L) 3.87 - 5.11 MIL/uL   Hemoglobin 8.6 (L) 12.0 - 15.0 g/dL    Comment: Reticulocyte Hemoglobin testing may be clinically indicated, consider ordering this additional test [id number]    HCT 26.6 (L) 36.0 - 46.0 %   MCV 75.8 (L) 80.0 - 100.0 fL   MCH 24.5 (L) 26.0 - 34.0 pg   MCHC 32.3 30.0 - 36.0 g/dL   RDW 41.3 (H) 24.4 - 01.0 %   Platelets 407 (H) 150 - 400 K/uL   nRBC 0.0 0.0 - 0.2 %    Comment: Performed at [facility], [address] [place], [place] [address]  Creatinine, serum     Status:  Abnormal   Collection Time: 01/12/21  9:09 PM  Result Value Ref Range   Creatinine, Ser 1.70 (H) 0.44 - 1.00 mg/dL   GFR, Estimated 32 (L) >60 mL/min    Comment: (NOTE) Calculated using the CKD-EPI Creatinine Equation (2021) Performed at [facility], [address], [place], [place] [address]   Resp Panel by RT-PCR (Flu A&B, Covid) Nasopharyngeal Swab     Status: None   Collection Time: 01/12/21  9:14 PM   Specimen: Nasopharyngeal Swab; Nasopharyngeal(NP) swabs in vial transport medium  Result Value Ref Range   SARS Coronavirus 2 by RT PCR NEGATIVE NEGATIVE    Comment: (NOTE) SARS-CoV-2 target nucleic acids are NOT DETECTED.  The SARS-CoV-2 RNA is generally detectable in upper respiratory specimens during the acute phase of infection. The lowest concentration of SARS-CoV-2 viral copies this assay can detect is 138 copies/mL. A negative result does not preclude SARS-Cov-2 infection and should not be used as the Juarez basis for treatment or other patient management decisions. A negative result may occur with  improper specimen collection/handling, submission of specimen other than nasopharyngeal swab, presence of viral mutation(s) within the areas targeted by this assay, and inadequate number of viral copies(<138  copies/mL). A negative result must be combined with clinical observations, patient history, and epidemiological information. The expected result is Negative.  Fact Sheet for Patients:  BloggerCourse.comhttps://www.fda.gov/media/152166/download  Fact Sheet for Healthcare Providers:  SeriousBroker.ithttps://www.fda.gov/media/152162/download  This test is no t yet approved or cleared by the [place] FDA and  has been authorized for detection and/or diagnosis of SARS-CoV-2 by FDA under an Emergency Use Authorization (EUA). This EUA will remain  in effect (meaning this test can be used) for the duration of the COVID-19 declaration under Section 564(b)(1) of the Act,  21 U.S.C.section 360bbb-3(b)(1), unless the authorization is terminated  or revoked sooner.       Influenza A by PCR NEGATIVE NEGATIVE   Influenza B by PCR NEGATIVE NEGATIVE    Comment: (NOTE) The Xpert Xpress SARS-CoV-2/FLU/RSV plus assay is intended as an aid in the diagnosis of influenza from Nasopharyngeal swab specimens and should not be used as a Juarez basis for treatment. Nasal washings and aspirates are unacceptable for Xpert Xpress SARS-CoV-2/FLU/RSV testing.  Fact Sheet for Patients: BloggerCourse.comhttps://www.fda.gov/media/152166/download  Fact Sheet for Healthcare Providers: SeriousBroker.ithttps://www.fda.gov/media/152162/download  This test is not yet approved or cleared by the [place] FDA and has been authorized for detection and/or diagnosis of SARS-CoV-2 by FDA under an Emergency Use Authorization (EUA). This EUA will remain in effect (meaning this test can be used) for the duration of the COVID-19 declaration under Section 564(b)(1) of the Act, 21 U.S.C. section 360bbb-3(b)(1), unless the authorization is terminated or revoked.  Performed at [facility], [address] [place], [place] [id number]   CBG monitoring, ED     Status: Abnormal   Collection Time: 01/12/21  9:57 PM  Result Value Ref Range   Glucose-Capillary 129 (H) 70 - 99 mg/dL    Comment: Glucose reference range applies only to samples taken after fasting for at least 8 hours.   No results found.  Pending Labs Unresulted Labs (From admission, onward)    Start     Ordered   01/13/21 0500  Basic metabolic panel  Tomorrow morning,   R        01/12/21 2003   01/13/21 0500  CBC  Tomorrow morning,   R        01/12/21 2003   01/12/21 2001  Urine Culture  Once,   STAT       Question:  Indication  Answer:  Urgency/frequency   01/12/21 2001          Vitals/Pain Today's Vitals   01/12/21 1930 01/12/21 2200 01/12/21 2220 01/12/21 2230  BP: (!) 206/79 (!) 230/75 (!) 195/58 (!) 176/69  Pulse: 75 74  82 84  Resp: 14 15 14 14   Temp:   97.9 F (36.6 C)   TempSrc:   Oral   SpO2: 98% 98% 98% 97%  Weight:      Height:      PainSc:        Isolation Precautions No active isolations  Medications Medications  aspirin EC tablet 81 mg (has no administration in time range)  gemfibrozil (LOPID) tablet 600 mg (has no administration in time range)  hydrALAZINE (APRESOLINE) tablet 100 mg (100 mg Oral Given 01/12/21 2219)  ALPRAZolam (XANAX) tablet 0.5 mg (0.5 mg Oral Given 01/12/21 2215)  hydrOXYzine (ATARAX/VISTARIL) tablet 25 mg (25 mg Oral Given 01/12/21 2216)  pantoprazole (PROTONIX) EC tablet 20 mg (has no administration in time range)  insulin aspart (novoLOG) injection 0-9 Units (has no administration in time range)  heparin  injection 5,000 Units (5,000 Units Subcutaneous Given 01/12/21 2219)  lactated ringers infusion ( Intravenous New Bag/Given 01/12/21 2234)  acetaminophen (TYLENOL) tablet 650 mg (has no administration in time range)    Or  acetaminophen (TYLENOL) suppository 650 mg (has no administration in time range)  hydrALAZINE (APRESOLINE) injection 10 mg (10 mg Intravenous Given 01/12/21 2212)  cefTRIAXone (ROCEPHIN) 1 g in sodium chloride 0.9 % 100 mL IVPB (has no administration in time range)  venlafaxine XR (EFFEXOR-XR) 24 hr capsule 150 mg (150 mg Oral Given 01/12/21 2230)  amLODipine (NORVASC) tablet 5 mg (5 mg Oral Given 01/12/21 2109)  cefTRIAXone (ROCEPHIN) 1 g in sodium chloride 0.9 % 100 mL IVPB (0 g Intravenous Stopped 01/12/21 2138)    Mobility walks Low fall risk   Focused Assessment:    R Recommendations: See Admitting Provider Note  Report given to:   Additional Notes:

## 2021-01-12 NOTE — Consult Note (Signed)
Urology Consult   Physician requesting consult: Dr Posey Rea  Reason for consult: Urinary retention, inability to place Foley catheter  History of Present Illness: Shirley Juarez is a 70 y.o. white female who was sent to the emergency room by [facility] physicians for recent CT finding showing urinary retention and bilateral hydronephrosis.  She was scheduled for CT scan after having difficulty voiding and PCP apparently palpating possible lower abdominal mass.  CT scan shows massively distended bladder and bilateral hydroureteronephrosis.  Serum creatinine was noted to be 1.98 with a GFR of 44.  Nurses were unable to place Foley catheter and urology consulted to assist in Foley placement.  Patient had no prior history of any GU surgery has had Foley catheter in the past for surgical procedure but no issues according to her. Procedure: Urethral dilation and Foley placement-attempted to pass Foley catheter with resistance inside the meatus.  Subsequently placed Urojet with resistance at the meatus as well.  I utilized a female sound at 52 French with some initial resistance to advance into the bladder with return of urine through the sound.  Subsequently dilated to 64 [place] and then placed 16 [place] Foley catheter without difficulty.  Approximately 1500 cc of clear urine obtained.  Patient feeling better following placement of Foley.  She denies a history of voiding or storage urinary symptoms, hematuria, UTIs, STDs, urolithiasis, GU malignancy/trauma/surgery.  Past Medical History:  Diagnosis Date   Anxiety    Arthritis    Osteoarthritis- knee and back   Coronary artery disease    Cath 2004 in [place], [place] with minor CAD   Depression    Diabetes mellitus    On oral and diet   GERD (gastroesophageal reflux disease)    Hyperlipidemia    Hypertension    Sleep apnea    does not use CPAP- it is broken.  Last sleep study was 7 years ago.  Has not worn in  4  years    Past Surgical History:  Procedure  Laterality Date   ABDOMINAL HYSTERECTOMY     KNEE ARTHROCENTESIS     TOTAL KNEE ARTHROPLASTY  06/22/2011   Bilateral      Current Hospital Medications:  Home meds:  No current facility-administered medications on file prior to encounter.   Current Outpatient Medications on File Prior to Encounter  Medication Sig Dispense Refill   ALPRAZolam (XANAX) 0.5 MG tablet Take 0.5 mg by mouth 2 (two) times daily.       aspirin EC 81 MG tablet Take 81 mg by mouth at bedtime.       Calcium Citrate-Vitamin D (CALCIUM CITRATE + D3 PO) Take 1 tablet by mouth every morning. Take one tablet of calcium citrate 630 iu / 500 iu daily      cetirizine (ZYRTEC) 10 MG tablet Take 10 mg by mouth every morning.       estradiol (ESTRACE) 2 MG tablet Take 2 mg by mouth 2 (two) times a week.      gemfibrozil (LOPID) 600 MG tablet Take 600 mg by mouth 2 (two) times daily before a meal.      hydrALAZINE (APRESOLINE) 100 MG tablet TAKE ONE TABLET BY MOUTH THREE TIMES A DAY 270 tablet 3   hydrOXYzine (ATARAX/VISTARIL) 25 MG tablet Take 25 mg by mouth at bedtime.       lansoprazole (PREVACID) 30 MG capsule Take 30 mg by mouth daily.       losartan-hydrochlorothiazide (HYZAAR) 100-25 MG per tablet Take 1 tablet by  mouth daily.       metFORMIN (GLUCOPHAGE) 500 MG tablet Take 500 mg by mouth in the morning and at bedtime.      triamcinolone cream (KENALOG) 0.1 % Apply 1 application topically 2 (two) times daily as needed (itching).      venlafaxine XR (EFFEXOR-XR) 75 MG 24 hr capsule Take 75 mg by mouth at bedtime. Take one tablet daily        Scheduled Meds: Continuous Infusions: PRN Meds:.  Allergies:  Allergies  Allergen Reactions   Latex Rash   Codeine Nausea And Vomiting   Crestor [Rosuvastatin Calcium] Nausea And Vomiting   Fish Oil Nausea And Vomiting   Lipitor [Atorvastatin Calcium] Nausea And Vomiting   Peanut-Containing Drug Products Hives   Welchol [Colesevelam Hcl] Nausea And Vomiting   Zithromax  [Azithromycin] Nausea And Vomiting    Family History  Problem Relation Age of Onset   Heart attack Mother 33   Liver disease Father    Heart attack Son        Age 28, he is our patient   Anesthesia problems Neg Hx     Social History:  reports that she has never smoked. She has never used smokeless tobacco. She reports that she does not drink alcohol and does not use drugs.  ROS: A complete review of systems was performed.  All systems are negative except for pertinent findings as noted.  Physical Exam:  Vital signs in last 24 hours: Temp:  [98 F (36.7 C)] 98 F (36.7 C) (08/11 1428) Pulse Rate:  [68-81] 73 (08/11 1830) Resp:  [16-18] 18 (08/11 1830) BP: (158-195)/(71-139) 193/84 (08/11 1830) SpO2:  [97 %-100 %] 99 % (08/11 1830) Weight:  [71 kg] 71 kg (08/11 1429) Constitutional:  Alert and oriented, No acute distress Cardiovascular: Regular rate and rhythm, No JVD Respiratory: Normal respiratory effort, Lungs clear bilaterally GI: Abdomen is soft, nontender, nondistended, no abdominal masses GU: No obvious vaginal lesions noted.  I cannot palpate any urethral masses.  The opening of the urethra is normal but narrowing was approximately 1/2 to 1 cm inside the meatus. Lymphatic: No lymphadenopathy Neurologic: Grossly intact, no focal deficits Psychiatric: Normal mood and affect  Laboratory Data:  Recent Labs    01/12/21 1456  WBC 3.7*  HGB 8.0*  HCT 24.8*  PLT 357    Recent Labs    01/12/21 1456  NA 140  K 3.2*  CL 103  GLUCOSE 130*  BUN 41*  CALCIUM 9.4  CREATININE 1.98*     Results for orders placed or performed during the hospital encounter of 01/12/21 (from the past 24 hour(s))  Urinalysis, Routine w reflex microscopic     Status: Abnormal   Collection Time: 01/12/21  2:48 PM  Result Value Ref Range   Color, Urine YELLOW YELLOW   APPearance HAZY (A) CLEAR   Specific Gravity, Urine 1.009 1.005 - 1.030   pH 8.0 5.0 - 8.0   Glucose, UA NEGATIVE  NEGATIVE mg/dL   Hgb urine dipstick SMALL (A) NEGATIVE   Bilirubin Urine NEGATIVE NEGATIVE   Ketones, ur NEGATIVE NEGATIVE mg/dL   Protein, ur NEGATIVE NEGATIVE mg/dL   Nitrite NEGATIVE NEGATIVE   Leukocytes,Ua LARGE (A) NEGATIVE   RBC / HPF 6-10 0 - 5 RBC/hpf   WBC, UA >50 (H) 0 - 5 WBC/hpf   Bacteria, UA NONE SEEN NONE SEEN   Squamous Epithelial / LPF 0-5 0 - 5   Mucus PRESENT    Non Squamous Epithelial  0-5 (A) NONE SEEN  CBC with Differential     Status: Abnormal   Collection Time: 01/12/21  2:56 PM  Result Value Ref Range   WBC 3.7 (L) 4.0 - 10.5 K/uL   RBC 3.25 (L) 3.87 - 5.11 MIL/uL   Hemoglobin 8.0 (L) 12.0 - 15.0 g/dL   HCT 17.4 (L) 94.4 - 96.7 %   MCV 76.3 (L) 80.0 - 100.0 fL   MCH 24.6 (L) 26.0 - 34.0 pg   MCHC 32.3 30.0 - 36.0 g/dL   RDW 59.1 (H) 63.8 - 46.6 %   Platelets 357 150 - 400 K/uL   nRBC 0.0 0.0 - 0.2 %   Neutrophils Relative % 60 %   Neutro Abs 2.2 1.7 - 7.7 K/uL   Lymphocytes Relative 25 %   Lymphs Abs 0.9 0.7 - 4.0 K/uL   Monocytes Relative 11 %   Monocytes Absolute 0.4 0.1 - 1.0 K/uL   Eosinophils Relative 3 %   Eosinophils Absolute 0.1 0.0 - 0.5 K/uL   Basophils Relative 1 %   Basophils Absolute 0.0 0.0 - 0.1 K/uL   Immature Granulocytes 0 %   Abs Immature Granulocytes 0.01 0.00 - 0.07 K/uL  Basic metabolic panel     Status: Abnormal   Collection Time: 01/12/21  2:56 PM  Result Value Ref Range   Sodium 140 135 - 145 mmol/L   Potassium 3.2 (L) 3.5 - 5.1 mmol/L   Chloride 103 98 - 111 mmol/L   CO2 24 22 - 32 mmol/L   Glucose, Bld 130 (H) 70 - 99 mg/dL   BUN 41 (H) 8 - 23 mg/dL   Creatinine, Ser 5.99 (H) 0.44 - 1.00 mg/dL   Calcium 9.4 8.9 - 35.7 mg/dL   GFR, Estimated 27 (L) >60 mL/min   Anion gap 13 5 - 15  Reticulocytes     Status: Abnormal   Collection Time: 01/12/21  2:56 PM  Result Value Ref Range   Retic Ct Pct 1.2 0.4 - 3.1 %   RBC. 3.21 (L) 3.87 - 5.11 MIL/uL   Retic Count, Absolute 39.5 19.0 - 186.0 K/uL   Immature Retic Fract  5.8 2.3 - 15.9 %   No results found for this or any previous visit (from the past 240 hour(s)).  Renal Function: Recent Labs    01/12/21 1456  CREATININE 1.98*   Estimated Creatinine Clearance: 22.9 mL/min (A) (by C-G formula based on SCr of 1.98 mg/dL (H)).  Radiologic Imaging: No results found.  I independently reviewed the above imaging studies.  Impression/Recommendation: 1.  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  Recommend observation overnight recheck creatinine in the morning if trending correctly will discharge home with Foley catheter with plans to return in about 1 week for cystoscopy as outpatient to assess urethra and bladder.  I will arrange outpatient follow-up.  Belva Agee 01/12/2021, 6:59 PM     CC:

## 2021-01-12 NOTE — ED Triage Notes (Signed)
Patient went to PCP for frequent urination and CT scan was performed and referred to ED for mass on the bladder.

## 2021-01-12 NOTE — H&P (Signed)
History and Physical    Shirley Juarez WUX:[id number] DOB: [birth year] DOA: 01/12/2021  PCP: Shirlean Mylar, MD  Patient coming from: Home.  Chief Complaint: Difficulty urinating.  HPI: Shirley Juarez is a 70 y.o. female with history of hypertension, atrial tachycardia diabetes mellitus on diet was referred to the ER by patient's primary care physician after CT scan showing bilateral hydronephrosis with urinary retention.  Patient has been having difficulty urinating last few days.  CT scan done outpatient showed bilateral hydronephrosis and urinary retention.  Patient was referred to the ER.  Patient denies any nausea vomiting or diarrhea.  Denies any dysuria.  ED Course: In the ER patient was afebrile.  Labs show creatinine of 1.9 which has worsened from 1.8 in 2017.  Hemoglobin is around 8 patient did have a low hemoglobin in 2013 from the labs available to which was around 9.  Ferritin is around 23.  TIBC 544.  Urologist was consulted and patient had Foley catheter placed.  Since patient has acute renal failure, urologist recommended observation overnight.  Patient blood pressure is more than 200 systolic.  COVID test was negative.  UA shows features concerning for UTI since patient had instrumentation in the urogenital tract ceftriaxone has been started and urine culture sent.  Review of Systems: As per HPI, rest all negative.   Past Medical History:  Diagnosis Date   Anxiety    Arthritis    Osteoarthritis- knee and back   Coronary artery disease    Cath 2004 in [place], [place] with minor CAD   Depression    Diabetes mellitus    On oral and diet   GERD (gastroesophageal reflux disease)    Hyperlipidemia    Hypertension    Sleep apnea    does not use CPAP- it is broken.  Last sleep study was 7 years ago.  Has not worn in  4  years    Past Surgical History:  Procedure Laterality Date   ABDOMINAL HYSTERECTOMY     KNEE ARTHROCENTESIS     TOTAL KNEE ARTHROPLASTY  06/22/2011   Bilateral       reports that she has never smoked. She has never used smokeless tobacco. She reports that she does not drink alcohol and does not use drugs.  Allergies  Allergen Reactions   Latex Rash   Codeine Nausea And Vomiting   Crestor [Rosuvastatin Calcium] Nausea And Vomiting   Fish Oil Nausea And Vomiting   Lipitor [Atorvastatin Calcium] Nausea And Vomiting   Peanut-Containing Drug Products Hives   Welchol [Colesevelam Hcl] Nausea And Vomiting   Zithromax [Azithromycin] Nausea And Vomiting    Family History  Problem Relation Age of Onset   Heart attack Mother 44   Liver disease Father    Heart attack Son        Age 56, he is our patient   Anesthesia problems Neg Hx     Prior to Admission medications   Medication Sig Start Date End Date Taking? Authorizing Provider  ALPRAZolam Prudy Feeler) 0.5 MG tablet Take 0.5 mg by mouth 2 (two) times daily.      [provider]  aspirin EC 81 MG tablet Take 81 mg by mouth at bedtime.      [provider]  Calcium Citrate-Vitamin D (CALCIUM CITRATE + D3 PO) Take 1 tablet by mouth every morning. Take one tablet of calcium citrate 630 iu / 500 iu daily     [provider]  cetirizine (ZYRTEC) 10 MG  tablet Take 10 mg by mouth every morning.      [provider]  estradiol (ESTRACE) 2 MG tablet Take 2 mg by mouth 2 (two) times a week.     [provider]  gemfibrozil (LOPID) 600 MG tablet Take 600 mg by mouth 2 (two) times daily before a meal.     [provider]  hydrALAZINE (APRESOLINE) 100 MG tablet TAKE ONE TABLET BY MOUTH THREE TIMES A DAY 04/04/20   Kathleene Hazel, MD  hydrOXYzine (ATARAX/VISTARIL) 25 MG tablet Take 25 mg by mouth at bedtime.      [provider]  lansoprazole (PREVACID) 30 MG capsule Take 30 mg by mouth daily.      [provider]  losartan-hydrochlorothiazide (HYZAAR) 100-25 MG per tablet Take 1 tablet by mouth daily.      [provider]   metFORMIN (GLUCOPHAGE) 500 MG tablet Take 500 mg by mouth in the morning and at bedtime.     [provider]  triamcinolone cream (KENALOG) 0.1 % Apply 1 application topically 2 (two) times daily as needed (itching).     [provider]  venlafaxine XR (EFFEXOR-XR) 75 MG 24 hr capsule Take 75 mg by mouth at bedtime. Take one tablet daily  09/02/12   [provider]    Physical Exam: Constitutional: Moderately built and nourished. Vitals:   01/12/21 1728 01/12/21 1830 01/12/21 1900 01/12/21 1930  BP: (!) 192/73 (!) 193/84 (!) 207/78 (!) 206/79  Pulse: 74 73 73 75  Resp: 18 18 16 14   Temp:      TempSrc:      SpO2: 100% 99% 98% 98%  Weight:      Height:       Eyes: Anicteric no pallor. ENMT: No discharge from the ears eyes nose and mouth. Neck: No mass felt.  No neck rigidity. Respiratory: No rhonchi or crepitations. Cardiovascular: S1-S2 heard. Abdomen: Soft nontender bowel sound present. Musculoskeletal: No edema. Skin: No rash. Neurologic: Alert awake oriented to time place and person.  Moves all extremities. Psychiatric: Appears normal.  Normal affect.   Labs on Admission: I have personally reviewed following labs and imaging studies  CBC: Recent Labs  Lab 01/12/21 1456  WBC 3.7*  NEUTROABS 2.2  HGB 8.0*  HCT 24.8*  MCV 76.3*  PLT 357   Basic Metabolic Panel: Recent Labs  Lab 01/12/21 1456  NA 140  K 3.2*  CL 103  CO2 24  GLUCOSE 130*  BUN 41*  CREATININE 1.98*  CALCIUM 9.4   GFR: Estimated Creatinine Clearance: 22.9 mL/min (A) (by C-G formula based on SCr of 1.98 mg/dL (H)). Liver Function Tests: No results for input(s): AST, ALT, ALKPHOS, BILITOT, PROT, ALBUMIN in the last 168 hours. No results for input(s): LIPASE, AMYLASE in the last 168 hours. No results for input(s): AMMONIA in the last 168 hours. Coagulation Profile: No results for input(s): INR, PROTIME in the last 168 hours. Cardiac Enzymes: No results for input(s):  CKTOTAL, CKMB, CKMBINDEX, TROPONINI in the last 168 hours. BNP (last 3 results) No results for input(s): PROBNP in the last 8760 hours. HbA1C: No results for input(s): HGBA1C in the last 72 hours. CBG: No results for input(s): GLUCAP in the last 168 hours. Lipid Profile: No results for input(s): CHOL, HDL, LDLCALC, TRIG, CHOLHDL, LDLDIRECT in the last 72 hours. Thyroid Function Tests: No results for input(s): TSH, T4TOTAL, FREET4, T3FREE, THYROIDAB in the last 72 hours. Anemia Panel: Recent Labs    01/12/21  1456 01/12/21 1652  VITAMINB12  --  150*  FERRITIN  --  23  TIBC  --  544*  IRON  --  23*  RETICCTPCT 1.2  --    Urine analysis:    Component Value Date/Time   COLORURINE YELLOW 01/12/2021 1448   APPEARANCEUR HAZY (A) 01/12/2021 1448   LABSPEC 1.009 01/12/2021 1448   PHURINE 8.0 01/12/2021 1448   GLUCOSEU NEGATIVE 01/12/2021 1448   HGBUR SMALL (A) 01/12/2021 1448   BILIRUBINUR NEGATIVE 01/12/2021 1448   KETONESUR NEGATIVE 01/12/2021 1448   PROTEINUR NEGATIVE 01/12/2021 1448   UROBILINOGEN 0.2 06/07/2008 1545   NITRITE NEGATIVE 01/12/2021 1448   LEUKOCYTESUR LARGE (A) 01/12/2021 1448   Sepsis Labs: @LABRCNTIP (procalcitonin:4,lacticidven:4) )No results found for this or any previous visit (from the past 240 hour(s)).   Radiological Exams on Admission: No results found.    Assessment/Plan Principal Problem:   ARF (acute renal failure) (HCC) Active Problems:   Coronary atherosclerosis of native coronary artery   Essential hypertension   Type 2 diabetes mellitus with hemoglobin A1c goal of less than 7.5% (HCC)    Acute renal failure secondary to possible urethral stenosis for which urologist was consulted.  Appreciate urology consult.  Foley catheter was placed by the urologist.  We will closely monitor patient's intake output metabolic panel. Hypertensive urgency patient is presently asymptomatic.  Due to acute renal failure we are holding patient's ARB and  hydrochlorothiazide.  We will keep patient on as needed IV hydralazine and will increase the patient's home dose of hydralazine to 3 times daily 100 mg.  Follow blood pressure trends closely. Microcytic hypochromic anemia when compared to the labs in 2013 patient was anemic and hemoglobin around 9.  Anemia panel shows possible iron deficiency anemia.  We will closely follow CBC.  Patient states she has had colonoscopy with Dr. 2014 in the recent past.  May need to transfuse if hemoglobin drops below 7. Abnormal UA with patient having urogenital instrumentation we will keep patient on ceftriaxone follow urine cultures. History of anxiety on hydroxyzine at bedtime as needed Xanax and Effexor. History of diabetes mellitus type 2 on diet as per the chart.  Closely monitor metabolic panel.   DVT prophylaxis: Heparin. Code Status: Full code. Family Communication: Patient's husband at the bedside. Disposition Plan: Home. Consults called: Urology. Admission status: Observation.   June MD [facility] Pager [phone].  If 7PM-7AM, please contact night-coverage [email or web address] Password [facility]  01/12/2021, 8:04 PM

## 2021-01-13 DIAGNOSIS — D649 Anemia, unspecified: Secondary | ICD-10-CM

## 2021-01-13 DIAGNOSIS — I10 Essential (primary) hypertension: Secondary | ICD-10-CM | POA: Diagnosis not present

## 2021-01-13 DIAGNOSIS — N179 Acute kidney failure, unspecified: Secondary | ICD-10-CM | POA: Diagnosis not present

## 2021-01-13 LAB — BASIC METABOLIC PANEL
Anion gap: 14 (ref 5–15)
Anion gap: 12 (ref 5–15)
BUN: 30 mg/dL — ABNORMAL HIGH (ref 8–23)
BUN: 26 mg/dL — ABNORMAL HIGH (ref 8–23)
CO2: 26 mmol/L (ref 22–32)
CO2: 26 mmol/L (ref 22–32)
Calcium: 9.7 mg/dL (ref 8.9–10.3)
Calcium: 9.6 mg/dL (ref 8.9–10.3)
Chloride: 97 mmol/L — ABNORMAL LOW (ref 98–111)
Chloride: 98 mmol/L (ref 98–111)
Creatinine, Ser: 1.37 mg/dL — ABNORMAL HIGH (ref 0.44–1.00)
Creatinine, Ser: 1.3 mg/dL — ABNORMAL HIGH (ref 0.44–1.00)
GFR, Estimated: 41 mL/min — ABNORMAL LOW (ref >60)
GFR, Estimated: 44 mL/min — ABNORMAL LOW (ref >60)
Glucose, Bld: 119 mg/dL — ABNORMAL HIGH (ref 70–99)
Glucose, Bld: 95 mg/dL (ref 70–99)
Potassium: 2.4 mmol/L — CL (ref 3.5–5.1)
Potassium: 3.1 mmol/L — ABNORMAL LOW (ref 3.5–5.1)
Sodium: 137 mmol/L (ref 135–145)
Sodium: 136 mmol/L (ref 135–145)

## 2021-01-13 LAB — GLUCOSE, CAPILLARY
Glucose-Capillary: 128 mg/dL — ABNORMAL HIGH (ref 70–99)
Glucose-Capillary: 207 mg/dL — ABNORMAL HIGH (ref 70–99)
Glucose-Capillary: 95 mg/dL (ref 70–99)
Glucose-Capillary: 167 mg/dL — ABNORMAL HIGH (ref 70–99)

## 2021-01-13 LAB — CBC
HCT: 25.8 % — ABNORMAL LOW (ref 36.0–46.0)
Hemoglobin: 8.3 g/dL — ABNORMAL LOW (ref 12.0–15.0)
MCH: 24.4 pg — ABNORMAL LOW (ref 26.0–34.0)
MCHC: 32.2 g/dL (ref 30.0–36.0)
MCV: 75.9 fL — ABNORMAL LOW (ref 80.0–100.0)
Platelets: 374 10*3/uL (ref 150–400)
RBC: 3.4 MIL/uL — ABNORMAL LOW (ref 3.87–5.11)
RDW: 16.5 % — ABNORMAL HIGH (ref 11.5–15.5)
WBC: 3.8 10*3/uL — ABNORMAL LOW (ref 4.0–10.5)
nRBC: 0 % (ref 0.0–0.2)

## 2021-01-13 LAB — MAGNESIUM: Magnesium: 1.5 mg/dL — ABNORMAL LOW (ref 1.7–2.4)

## 2021-01-13 MED ORDER — POTASSIUM CHLORIDE 10 MEQ/100ML IV SOLN
INTRAVENOUS | Status: AC
  Administered 2021-01-13: 10 meq via INTRAVENOUS
  Filled 2021-01-13: qty 100

## 2021-01-13 MED ORDER — HYDRALAZINE HCL 100 MG PO TABS: 100.0000 mg | ORAL_TABLET | Freq: Two times a day (BID) | ORAL | Status: DC

## 2021-01-13 MED ORDER — CYANOCOBALAMIN 1000 MCG/ML IJ SOLN
1000.0000 ug | Freq: Once | INTRAMUSCULAR | Status: AC
  Administered 2021-01-13: 1000 ug via INTRAMUSCULAR
  Filled 2021-01-13: qty 1

## 2021-01-13 MED ORDER — POTASSIUM CHLORIDE CRYS ER 10 MEQ PO TBCR: 40.0000 meq | EXTENDED_RELEASE_TABLET | ORAL | Status: DC

## 2021-01-13 MED ORDER — POTASSIUM CHLORIDE CRYS ER 10 MEQ PO TBCR
40.0000 meq | EXTENDED_RELEASE_TABLET | ORAL | Status: DC
  Administered 2021-01-13 (×2): 40 meq via ORAL
  Filled 2021-01-13 (×2): qty 4

## 2021-01-13 MED ORDER — HYDRALAZINE HCL 50 MG PO TABS: 100.0000 mg | ORAL_TABLET | Freq: Two times a day (BID) | ORAL | Status: DC

## 2021-01-13 MED ORDER — MAGNESIUM SULFATE 2 GM/50ML IV SOLN
2.0000 g | Freq: Once | INTRAVENOUS | Status: AC
  Administered 2021-01-13: 2 g via INTRAVENOUS
  Filled 2021-01-13: qty 50

## 2021-01-13 MED ORDER — POTASSIUM CHLORIDE 10 MEQ/100ML IV SOLN
10.0000 meq | INTRAVENOUS | Status: AC
  Administered 2021-01-13: 10 meq via INTRAVENOUS
  Filled 2021-01-13: qty 100

## 2021-01-13 MED ORDER — POTASSIUM CHLORIDE CRYS ER 10 MEQ PO TBCR
40.0000 meq | EXTENDED_RELEASE_TABLET | ORAL | Status: AC
  Administered 2021-01-13: 40 meq via ORAL
  Filled 2021-01-13: qty 4

## 2021-01-13 MED ORDER — CHLORHEXIDINE GLUCONATE CLOTH 2 % EX PADS
6.0000 | MEDICATED_PAD | Freq: Every day | CUTANEOUS | Status: DC
  Administered 2021-01-13: 6 via TOPICAL

## 2021-01-13 NOTE — Plan of Care (Signed)
Pt is injury free, afebrile, alert, and oriented x 4. VS within baseline. Foley catheter remains intact. She denies chest pain, SOB, nausea or vomiting. Will continue to monitor   Problem: Education: Goal: Knowledge of General Education information will improve Description: Including pain rating scale, medication(s)/side effects and non-pharmacologic comfort measures Outcome: Progressing   Problem: Health Behavior/Discharge Planning: Goal: Ability to manage health-related needs will improve Outcome: Progressing   Problem: Clinical Measurements: Goal: Ability to maintain clinical measurements within normal limits will improve Outcome: Progressing Goal: Will remain free from infection Outcome: Progressing Goal: Diagnostic test results will improve Outcome: Progressing Goal: Respiratory complications will improve Outcome: Progressing Goal: Cardiovascular complication will be avoided Outcome: Progressing   Problem: Activity: Goal: Risk for activity intolerance will decrease Outcome: Progressing   Problem: Nutrition: Goal: Adequate nutrition will be maintained Outcome: Progressing   Problem: Coping: Goal: Level of anxiety will decrease Outcome: Progressing   Problem: Pain Managment: Goal: General experience of comfort will improve Outcome: Progressing   Problem: Safety: Goal: Ability to remain free from injury will improve Outcome: Progressing   Problem: Skin Integrity: Goal: Risk for impaired skin integrity will decrease Outcome: Progressing

## 2021-01-13 NOTE — Discharge Instructions (Signed)
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   plan to return in about 1 week for cystoscopy as outpatient to assess urethra and bladder

## 2021-01-13 NOTE — Progress Notes (Signed)
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  Recommend observation overnight recheck creatinine in the morning if trending correctly will discharge home with Foley catheter with plans to return in about 1 week for cystoscopy as outpatient to assess urethra and bladder.  I will arrange outpatient follow-up.  Will replace K and if stable at [facility] will d/c home with close outpatient follow up  Marlin Canary DO

## 2021-01-13 NOTE — Discharge Summary (Signed)
Physician Discharge Summary  Shirley SoleHelen D Juarez ZOX:[id number] DOB: 02/23/1951 DOA: 01/12/2021  PCP: Shirley Juarez, Carol, MD  Admit date: 01/12/2021 Discharge date: 01/13/2021  Admitted From: home Discharge disposition: home   Recommendations for Outpatient Follow-Up:   Bmp 1 week Outpatient urology follow up   Discharge Diagnosis:   Principal Problem:   ARF (acute renal failure) (HCC) Active Problems:   Coronary atherosclerosis of native coronary artery   Essential hypertension   Type 2 diabetes mellitus with hemoglobin A1c goal of less than 7.5% (HCC)    Discharge Condition: Improved.  Diet recommendation: Low sodium, heart healthy.  Carbohydrate-modified.  Wound care: None.  Code status: Full.   History of Present Illness:   Shirley Juarez is a 70 y.o. female with history of hypertension, atrial tachycardia diabetes mellitus on diet was referred to the ER by patient's primary care physician after CT scan showing bilateral hydronephrosis with urinary retention.  Patient has been having difficulty urinating last few days.  CT scan done outpatient showed bilateral hydronephrosis and urinary retention.  Patient was referred to the ER.  Patient denies any nausea vomiting or diarrhea.  Denies any dysuria.   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  Recommend observation overnight recheck creatinine in the morning if trending correctly will discharge home with Foley catheter with plans to return in about 1 week for cystoscopy as outpatient to assess urethra and bladder.  I will arrange outpatient follow-up  Hypokalemia/hypomagnesemia -aggressively repleted -at 3.1- given another dose of 40 meq-- follow up outpatient  Hypertensive urgency patient is presently asymptomatic. -resume home meds.  Microcytic hypochromic  anemia when compared to the labs in 2013 patient was anemic and hemoglobin around 9.   -outpatient work up and follow up  History of anxiety  -on hydroxyzine at bedtime as needed  -Xanax and Effexor.  History of diabetes mellitus type 2  -on diet     Medical Consultants:   urology   Discharge Exam:   Vitals:   01/13/21 0909 01/13/21 1215  BP: (!) 169/65 (!) 133/50  Pulse:  80  Resp:    Temp:  98.9 F (37.2 C)  SpO2:  94%   Vitals:   01/13/21 0500 01/13/21 0652 01/13/21 0909 01/13/21 1215  BP:  (!) 151/62 (!) 169/65 (!) 133/50  Pulse:  74  80  Resp:  18    Temp:  98 F (36.7 C)  98.9 F (37.2 C)  TempSrc:  Oral  Oral  SpO2:  94%  94%  Weight: 70.4 kg     Height:        General exam: Appears calm and comfortable.    The results of significant diagnostics from this hospitalization (including imaging, microbiology, ancillary and laboratory) are listed below for reference.     Procedures and Diagnostic Studies:   No results found.   Labs:   Basic Metabolic Panel: Recent Labs  Lab 01/12/21 1456 01/12/21 2109 01/13/21 0527 01/13/21 1348  NA 140  --  137 136  K 3.2*  --  2.4* 3.1*  CL 103  --  97* 98  CO2 24  --  26 26  GLUCOSE 130*  --  119* 95  BUN 41*  --  30* 26*  CREATININE 1.98* 1.70* 1.37* 1.30*  CALCIUM 9.4  --  9.7 9.6  MG  --   --  1.5*  --    GFR Estimated Creatinine Clearance: 34.8 mL/min (A) (by C-G formula based on SCr of 1.3 mg/dL (H)). Liver Function Tests: No results for input(s): AST, ALT, ALKPHOS, BILITOT, PROT, ALBUMIN in the last 168 hours. No results for input(s): LIPASE, AMYLASE in the last 168 hours. No results for input(s): AMMONIA in the last 168 hours. Coagulation profile No results for input(s): INR, PROTIME in the last 168 hours.  CBC: Recent Labs  Lab 01/12/21 1456 01/12/21 2109 01/13/21 0527  WBC 3.7* 4.1 3.8*  NEUTROABS 2.2  --   --   HGB 8.0* 8.6* 8.3*  HCT 24.8* 26.6* 25.8*  MCV 76.3* 75.8* 75.9*   PLT 357 407* 374   Cardiac Enzymes: No results for input(s): CKTOTAL, CKMB, CKMBINDEX, TROPONINI in the last 168 hours. BNP: Invalid input(s): POCBNP CBG: Recent Labs  Lab 01/12/21 2157 01/13/21 0812 01/13/21 1154 01/13/21 1412  GLUCAP 129* 128* 207* 95   D-Dimer No results for input(s): DDIMER in the last 72 hours. Hgb A1c No results for input(s): HGBA1C in the last 72 hours. Lipid Profile No results for input(s): CHOL, HDL, LDLCALC, TRIG, CHOLHDL, LDLDIRECT in the last 72 hours. Thyroid function studies No results for input(s): TSH, T4TOTAL, T3FREE, THYROIDAB in the last 72 hours.  Invalid input(s): FREET3 Anemia work up Recent Labs    01/12/21 1456 01/12/21 1652  VITAMINB12  --  150*  FOLATE  --  12.8  FERRITIN  --  23  TIBC  --  544*  IRON  --  23*  RETICCTPCT 1.2  --    Microbiology Recent Results (from the past 240 hour(s))  Resp Panel by RT-PCR (Flu A&B, Covid) Nasopharyngeal Swab     Status: None   Collection Time: 01/12/21  9:14 PM   Specimen: Nasopharyngeal Swab; Nasopharyngeal(NP) swabs in vial transport medium  Result Value Ref Range Status   SARS Coronavirus 2 by RT PCR NEGATIVE NEGATIVE Final    Comment: (NOTE) SARS-CoV-2 target nucleic acids are NOT DETECTED.  The SARS-CoV-2 RNA is generally detectable in upper respiratory specimens during the acute phase of infection. The lowest concentration of SARS-CoV-2 viral copies this assay can detect is 138 copies/mL. A negative result does not preclude SARS-Cov-2 infection and should not be used as the sole basis for treatment or other patient management decisions. A negative result may occur with  improper specimen collection/handling, submission of specimen other than nasopharyngeal swab, presence of viral mutation(s) within the areas targeted by this assay, and inadequate number of viral copies(<138 copies/mL). A negative result must be combined with clinical observations, patient history, and  epidemiological information. The expected result is Negative.  Fact Sheet for Patients:  BloggerCourse.com  Fact Sheet for Healthcare Providers:  SeriousBroker.it  This test is no t yet approved or cleared by the [place] FDA and  has been authorized for detection and/or diagnosis of SARS-CoV-2 by FDA under an Emergency Use Authorization (EUA). This EUA will remain  in effect (meaning this test can be used) for the duration of the COVID-19 declaration under Section 564(b)(1) of the Act, 21 U.S.C.section 360bbb-3(b)(1), unless the authorization is terminated  or revoked sooner.       Influenza A by PCR NEGATIVE NEGATIVE Final   Influenza B by PCR NEGATIVE NEGATIVE Final    Comment: (NOTE) The Xpert Xpress SARS-CoV-2/FLU/RSV plus assay is intended as an aid in the diagnosis of influenza from Nasopharyngeal swab specimens and should not be used as a sole basis for  treatment. Nasal washings and aspirates are unacceptable for Xpert Xpress SARS-CoV-2/FLU/RSV testing.  Fact Sheet for Patients: BloggerCourse.com  Fact Sheet for Healthcare Providers: SeriousBroker.it  This test is not yet approved or cleared by the [place] FDA and has been authorized for detection and/or diagnosis of SARS-CoV-2 by FDA under an Emergency Use Authorization (EUA). This EUA will remain in effect (meaning this test can be used) for the duration of the COVID-19 declaration under Section 564(b)(1) of the Act, 21 U.S.C. section 360bbb-3(b)(1), unless the authorization is terminated or revoked.  Performed at [facility], [address] [place], [place] [address]      Discharge Instructions:   Discharge Instructions     Diet - low sodium heart healthy   Complete by: As directed    Diet Carb Modified   Complete by: As directed    Discharge instructions   Complete by: As  directed    BMP/CBC 1 week   Increase activity slowly   Complete by: As directed       Allergies as of 01/13/2021       Reactions   Latex Rash   Codeine Nausea And Vomiting   Crestor [rosuvastatin Calcium] Nausea And Vomiting   Fish Oil Nausea And Vomiting   Hydralazine Other (See Comments), Cough   100 mg three times a day causes excessive coughing   Lipitor [atorvastatin Calcium] Nausea And Vomiting   Peanut-containing Drug Products Hives   Statins Nausea And Vomiting   Welchol [colesevelam Hcl] Nausea And Vomiting   Zithromax [azithromycin] Nausea And Vomiting   Other Other (See Comments)   Pet dander        Medication List     STOP taking these medications    ibuprofen 200 MG tablet Commonly known as: ADVIL   losartan-hydrochlorothiazide 100-25 MG tablet Commonly known as: HYZAAR   oxybutynin 5 MG 24 hr tablet Commonly known as: DITROPAN-XL   triamcinolone cream 0.1 % Commonly known as: KENALOG       TAKE these medications    acetaminophen 325 MG tablet Commonly known as: TYLENOL Take 325-650 mg by mouth every 6 (six) hours as needed for mild pain or headache.   ALPRAZolam 0.5 MG tablet Commonly known as: XANAX Take 0.5 mg by mouth in the morning and at bedtime.   amoxicillin 500 MG capsule Commonly known as: AMOXIL Take 1,000-2,000 mg by mouth See admin instructions. Take 2,000 mg by mouth one hour prior to dental procedures and 1,000 mg once daily the day after   aspirin EC 81 MG tablet Take 81 mg by mouth at bedtime.   CALCIUM CITRATE + D3 PO Take 1 tablet by mouth every Monday, Wednesday, and Friday.   cetirizine 10 MG tablet Commonly known as: ZYRTEC Take 10 mg by mouth every morning.   estradiol 2 MG tablet Commonly known as: ESTRACE Take 1 mg by mouth 2 (two) times a week.   gemfibrozil 600 MG tablet Commonly known as: LOPID Take 600 mg by mouth 2 (two) times daily before a meal.   hydrALAZINE 100 MG tablet Commonly known as:  APRESOLINE Take 1 tablet (100 mg total) by mouth in the morning and at bedtime.   hydrOXYzine 25 MG tablet Commonly known as: ATARAX/VISTARIL Take 25 mg by mouth at bedtime.   lansoprazole 30 MG capsule Commonly known as: PREVACID Take 30 mg by mouth daily before breakfast.   metFORMIN 500 MG tablet Commonly known as: GLUCOPHAGE Take 1,000 mg by mouth daily  with breakfast.   ondansetron 8 MG tablet Commonly known as: ZOFRAN Take 8 mg by mouth daily as needed for nausea or vomiting.   venlafaxine XR 150 MG 24 hr capsule Commonly known as: EFFEXOR-XR Take 150 mg by mouth at bedtime. What changed: Another medication with the same name was removed. Continue taking this medication, and follow the directions you see here.        Follow-up Information     Shirley Mylar, MD Follow up in 1 week(s).   Specialty: Family Medicine Contact information: [address] [place] [address] [phone]         Belva Agee, MD. Schedule an appointment as soon as possible for a visit in 1 week(s).   Specialty: Urology Contact information: [address] [place] [address] [phone]                  Time coordinating discharge: 25 min  Signed:  Joseph Art DO  [facility] 01/13/2021, 3:06 PM

## 2021-01-14 LAB — URINE CULTURE: Culture: NO GROWTH

## 2021-01-28 ENCOUNTER — Emergency Department (HOSPITAL_COMMUNITY): Payer: Medicare Other

## 2021-01-28 ENCOUNTER — Other Ambulatory Visit: Payer: Self-pay

## 2021-01-28 ENCOUNTER — Encounter (HOSPITAL_COMMUNITY): Payer: Self-pay

## 2021-01-28 ENCOUNTER — Inpatient Hospital Stay (HOSPITAL_COMMUNITY)
Admission: EM | Admit: 2021-01-28 | Discharge: 2021-02-01 | DRG: 699 | Disposition: A | Discharge status: Home Health | Payer: Medicare Other | Attending: Family Medicine | Admitting: Family Medicine

## 2021-01-28 DIAGNOSIS — B952 Enterococcus as the cause of diseases classified elsewhere: Secondary | ICD-10-CM | POA: Diagnosis not present

## 2021-01-28 DIAGNOSIS — Z881 Allergy status to other antibiotic agents status: Secondary | ICD-10-CM

## 2021-01-28 DIAGNOSIS — Z96653 Presence of artificial knee joint, bilateral: Secondary | ICD-10-CM | POA: Diagnosis present

## 2021-01-28 DIAGNOSIS — I129 Hypertensive chronic kidney disease with stage 1 through stage 4 chronic kidney disease, or unspecified chronic kidney disease: Secondary | ICD-10-CM | POA: Diagnosis present

## 2021-01-28 DIAGNOSIS — K219 Gastro-esophageal reflux disease without esophagitis: Secondary | ICD-10-CM | POA: Diagnosis present

## 2021-01-28 DIAGNOSIS — F419 Anxiety disorder, unspecified: Secondary | ICD-10-CM | POA: Diagnosis present

## 2021-01-28 DIAGNOSIS — Y846 Urinary catheterization as the cause of abnormal reaction of the patient, or of later complication, without mention of misadventure at the time of the procedure: Secondary | ICD-10-CM | POA: Diagnosis present

## 2021-01-28 DIAGNOSIS — E876 Hypokalemia: Secondary | ICD-10-CM | POA: Diagnosis present

## 2021-01-28 DIAGNOSIS — Z90710 Acquired absence of both cervix and uterus: Secondary | ICD-10-CM

## 2021-01-28 DIAGNOSIS — T83511A Infection and inflammatory reaction due to indwelling urethral catheter, initial encounter: Secondary | ICD-10-CM | POA: Diagnosis not present

## 2021-01-28 DIAGNOSIS — Z7984 Long term (current) use of oral hypoglycemic drugs: Secondary | ICD-10-CM | POA: Diagnosis not present

## 2021-01-28 DIAGNOSIS — R9431 Abnormal electrocardiogram [ECG] [EKG]: Secondary | ICD-10-CM | POA: Diagnosis not present

## 2021-01-28 DIAGNOSIS — E871 Hypo-osmolality and hyponatremia: Secondary | ICD-10-CM | POA: Diagnosis present

## 2021-01-28 DIAGNOSIS — D649 Anemia, unspecified: Secondary | ICD-10-CM

## 2021-01-28 DIAGNOSIS — F418 Other specified anxiety disorders: Secondary | ICD-10-CM

## 2021-01-28 DIAGNOSIS — N1832 Chronic kidney disease, stage 3b: Secondary | ICD-10-CM | POA: Diagnosis not present

## 2021-01-28 DIAGNOSIS — Z885 Allergy status to narcotic agent status: Secondary | ICD-10-CM

## 2021-01-28 DIAGNOSIS — J90 Pleural effusion, not elsewhere classified: Secondary | ICD-10-CM | POA: Diagnosis not present

## 2021-01-28 DIAGNOSIS — Z20822 Contact with and (suspected) exposure to covid-19: Secondary | ICD-10-CM | POA: Diagnosis present

## 2021-01-28 DIAGNOSIS — R5383 Other fatigue: Secondary | ICD-10-CM

## 2021-01-28 DIAGNOSIS — N390 Urinary tract infection, site not specified: Secondary | ICD-10-CM | POA: Diagnosis not present

## 2021-01-28 DIAGNOSIS — F32A Depression, unspecified: Secondary | ICD-10-CM | POA: Diagnosis not present

## 2021-01-28 DIAGNOSIS — E119 Type 2 diabetes mellitus without complications: Secondary | ICD-10-CM | POA: Diagnosis not present

## 2021-01-28 DIAGNOSIS — Z7982 Long term (current) use of aspirin: Secondary | ICD-10-CM

## 2021-01-28 DIAGNOSIS — I4581 Long QT syndrome: Secondary | ICD-10-CM | POA: Diagnosis present

## 2021-01-28 DIAGNOSIS — R531 Weakness: Secondary | ICD-10-CM | POA: Diagnosis not present

## 2021-01-28 DIAGNOSIS — I214 Non-ST elevation (NSTEMI) myocardial infarction: Secondary | ICD-10-CM

## 2021-01-28 DIAGNOSIS — N136 Pyonephrosis: Secondary | ICD-10-CM | POA: Diagnosis not present

## 2021-01-28 DIAGNOSIS — D509 Iron deficiency anemia, unspecified: Secondary | ICD-10-CM | POA: Diagnosis present

## 2021-01-28 DIAGNOSIS — I2510 Atherosclerotic heart disease of native coronary artery without angina pectoris: Secondary | ICD-10-CM | POA: Diagnosis present

## 2021-01-28 DIAGNOSIS — R338 Other retention of urine: Secondary | ICD-10-CM | POA: Diagnosis not present

## 2021-01-28 DIAGNOSIS — E785 Hyperlipidemia, unspecified: Secondary | ICD-10-CM | POA: Diagnosis present

## 2021-01-28 DIAGNOSIS — R110 Nausea: Secondary | ICD-10-CM | POA: Diagnosis present

## 2021-01-28 DIAGNOSIS — Z8249 Family history of ischemic heart disease and other diseases of the circulatory system: Secondary | ICD-10-CM | POA: Diagnosis not present

## 2021-01-28 DIAGNOSIS — N183 Chronic kidney disease, stage 3 unspecified: Secondary | ICD-10-CM

## 2021-01-28 DIAGNOSIS — Z79899 Other long term (current) drug therapy: Secondary | ICD-10-CM

## 2021-01-28 DIAGNOSIS — R778 Other specified abnormalities of plasma proteins: Secondary | ICD-10-CM | POA: Diagnosis present

## 2021-01-28 DIAGNOSIS — E860 Dehydration: Secondary | ICD-10-CM | POA: Diagnosis present

## 2021-01-28 DIAGNOSIS — R339 Retention of urine, unspecified: Secondary | ICD-10-CM | POA: Diagnosis not present

## 2021-01-28 DIAGNOSIS — E1122 Type 2 diabetes mellitus with diabetic chronic kidney disease: Secondary | ICD-10-CM | POA: Diagnosis not present

## 2021-01-28 DIAGNOSIS — Z91040 Latex allergy status: Secondary | ICD-10-CM

## 2021-01-28 DIAGNOSIS — I10 Essential (primary) hypertension: Secondary | ICD-10-CM | POA: Diagnosis present

## 2021-01-28 DIAGNOSIS — Z888 Allergy status to other drugs, medicaments and biological substances status: Secondary | ICD-10-CM

## 2021-01-28 LAB — COMPREHENSIVE METABOLIC PANEL
ALT: 17 U/L (ref 0–44)
AST: 27 U/L (ref 15–41)
Albumin: 3.1 g/dL — ABNORMAL LOW (ref 3.5–5.0)
Alkaline Phosphatase: 63 U/L (ref 38–126)
Anion gap: 14 (ref 5–15)
BUN: 29 mg/dL — ABNORMAL HIGH (ref 8–23)
CO2: 23 mmol/L (ref 22–32)
Calcium: 8.4 mg/dL — ABNORMAL LOW (ref 8.9–10.3)
Chloride: 86 mmol/L — ABNORMAL LOW (ref 98–111)
Creatinine, Ser: 1.47 mg/dL — ABNORMAL HIGH (ref 0.44–1.00)
GFR, Estimated: 38 mL/min — ABNORMAL LOW (ref >60)
Glucose, Bld: 175 mg/dL — ABNORMAL HIGH (ref 70–99)
Potassium: 2.5 mmol/L — CL (ref 3.5–5.1)
Sodium: 123 mmol/L — ABNORMAL LOW (ref 135–145)
Total Bilirubin: 0.6 mg/dL (ref 0.3–1.2)
Total Protein: 6.9 g/dL (ref 6.5–8.1)

## 2021-01-28 LAB — CBC WITH DIFFERENTIAL/PLATELET
Band Neutrophils: 5 %
Basophils Relative: 0 %
Blasts: NONE SEEN %
Eosinophils Relative: 0 %
HCT: 22.5 % — ABNORMAL LOW (ref 36.0–46.0)
Hemoglobin: 7.4 g/dL — ABNORMAL LOW (ref 12.0–15.0)
Lymphocytes Relative: 8 %
MCH: 23.8 pg — ABNORMAL LOW (ref 26.0–34.0)
MCHC: 32.9 g/dL (ref 30.0–36.0)
MCV: 72.3 fL — ABNORMAL LOW (ref 80.0–100.0)
Metamyelocytes Relative: NONE SEEN %
Monocytes Relative: 3 %
Myelocytes: NONE SEEN %
Neutrophils Relative %: 84 %
Platelets: 344 10*3/uL (ref 150–400)
Promyelocytes Relative: NONE SEEN %
RBC Morphology: NORMAL
RBC: 3.11 MIL/uL — ABNORMAL LOW (ref 3.87–5.11)
RDW: 15.9 % — ABNORMAL HIGH (ref 11.5–15.5)
WBC Morphology: NORMAL
WBC: 10 10*3/uL (ref 4.0–10.5)
nRBC: 0 % (ref 0.0–0.2)
nRBC: NONE SEEN /100 WBC

## 2021-01-28 LAB — RESP PANEL BY RT-PCR (FLU A&B, COVID) ARPGX2
Influenza A by PCR: NEGATIVE
Influenza B by PCR: NEGATIVE
SARS Coronavirus 2 by RT PCR: NEGATIVE

## 2021-01-28 LAB — TSH: TSH: 0.735 u[IU]/mL (ref 0.350–4.500)

## 2021-01-28 LAB — SALICYLATE LEVEL: Salicylate Lvl: <7 mg/dL — ABNORMAL LOW (ref 7.0–30.0)

## 2021-01-28 LAB — OSMOLALITY: Osmolality: 266 mOsm/kg — ABNORMAL LOW (ref 275–295)

## 2021-01-28 LAB — IRON AND TIBC
Iron: 11 ug/dL — ABNORMAL LOW (ref 28–170)
Saturation Ratios: 3 % — ABNORMAL LOW (ref 10.4–31.8)
TIBC: 349 ug/dL (ref 250–450)
UIBC: 338 ug/dL

## 2021-01-28 LAB — ACETAMINOPHEN LEVEL: Acetaminophen (Tylenol), Serum: <10 ug/mL — ABNORMAL LOW (ref 10–30)

## 2021-01-28 LAB — FERRITIN: Ferritin: 134 ng/mL (ref 11–307)

## 2021-01-28 LAB — LIPASE, BLOOD: Lipase: 25 U/L (ref 11–51)

## 2021-01-28 LAB — TROPONIN I (HIGH SENSITIVITY)
Troponin I (High Sensitivity): 20 ng/L — ABNORMAL HIGH (ref <18)
Troponin I (High Sensitivity): 17 ng/L (ref <18)

## 2021-01-28 LAB — MAGNESIUM: Magnesium: 1.6 mg/dL — ABNORMAL LOW (ref 1.7–2.4)

## 2021-01-28 LAB — LACTATE DEHYDROGENASE: LDH: 170 U/L (ref 98–192)

## 2021-01-28 LAB — POTASSIUM: Potassium: 2.7 mmol/L — CL (ref 3.5–5.1)

## 2021-01-28 LAB — GLUCOSE, CAPILLARY: Glucose-Capillary: 135 mg/dL — ABNORMAL HIGH (ref 70–99)

## 2021-01-28 MED ORDER — SODIUM CHLORIDE 0.9 % IV BOLUS
1000.0000 mL | Freq: Once | INTRAVENOUS | Status: AC
  Administered 2021-01-28: 1000 mL via INTRAVENOUS

## 2021-01-28 MED ORDER — POTASSIUM CHLORIDE 10 MEQ/100ML IV SOLN
10.0000 meq | INTRAVENOUS | Status: AC
  Administered 2021-01-28 (×3): 10 meq via INTRAVENOUS
  Filled 2021-01-28 (×3): qty 100

## 2021-01-28 MED ORDER — HYDROXYZINE HCL 25 MG PO TABS
25.0000 mg | ORAL_TABLET | Freq: Every day | ORAL | Status: DC
  Administered 2021-01-29: 25 mg via ORAL
  Filled 2021-01-28: qty 1

## 2021-01-28 MED ORDER — ASPIRIN EC 81 MG PO TBEC: 81.0000 mg | DELAYED_RELEASE_TABLET | Freq: Every day | ORAL | Status: DC

## 2021-01-28 MED ORDER — PANTOPRAZOLE SODIUM 40 MG IV SOLR
40.0000 mg | Freq: Once | INTRAVENOUS | Status: AC
  Administered 2021-01-29: 40 mg via INTRAVENOUS
  Filled 2021-01-28: qty 40

## 2021-01-28 MED ORDER — METFORMIN HCL 500 MG PO TABS: 1000.0000 mg | ORAL_TABLET | Freq: Every day | ORAL | Status: DC

## 2021-01-28 MED ORDER — TRIMETHOBENZAMIDE HCL 100 MG/ML IM SOLN
200.0000 mg | Freq: Three times a day (TID) | INTRAMUSCULAR | Status: DC | PRN
  Filled 2021-01-28: qty 2

## 2021-01-28 MED ORDER — ACETAMINOPHEN 650 MG RE SUPP
650.0000 mg | Freq: Four times a day (QID) | RECTAL | Status: DC | PRN
  Administered 2021-01-29: 650 mg via RECTAL
  Filled 2021-01-28: qty 1

## 2021-01-28 MED ORDER — MAGNESIUM OXIDE -MG SUPPLEMENT 400 (240 MG) MG PO TABS: 400.0000 mg | ORAL_TABLET | Freq: Once | ORAL | Status: DC

## 2021-01-28 MED ORDER — POTASSIUM CHLORIDE 10 MEQ/100ML IV SOLN
10.0000 meq | INTRAVENOUS | Status: AC
  Administered 2021-01-28 – 2021-01-29 (×2): 10 meq via INTRAVENOUS
  Filled 2021-01-28 (×2): qty 100

## 2021-01-28 MED ORDER — MECLIZINE HCL 25 MG PO TABS
25.0000 mg | ORAL_TABLET | Freq: Once | ORAL | Status: AC
  Administered 2021-01-28: 25 mg via ORAL
  Filled 2021-01-28: qty 1

## 2021-01-28 MED ORDER — HEPARIN SODIUM (PORCINE) 5000 UNIT/ML IJ SOLN
5000.0000 [IU] | Freq: Three times a day (TID) | INTRAMUSCULAR | Status: DC
Start: 2021-01-28 — End: 2021-01-30
  Administered 2021-01-29 – 2021-01-30 (×4): 5000 [IU] via SUBCUTANEOUS
  Filled 2021-01-28 (×4): qty 1

## 2021-01-28 MED ORDER — LACTATED RINGERS IV SOLN: INTRAVENOUS | Status: DC

## 2021-01-28 MED ORDER — INSULIN ASPART 100 UNIT/ML IJ SOLN: 0.0000 [IU] | Freq: Every day | INTRAMUSCULAR | Status: DC

## 2021-01-28 MED ORDER — MAGNESIUM SULFATE IN D5W 1-5 GM/100ML-% IV SOLN
1.0000 g | Freq: Once | INTRAVENOUS | Status: AC
  Administered 2021-01-28: 1 g via INTRAVENOUS
  Filled 2021-01-28: qty 100

## 2021-01-28 MED ORDER — PANTOPRAZOLE SODIUM 40 MG PO TBEC
40.0000 mg | DELAYED_RELEASE_TABLET | Freq: Every day | ORAL | Status: DC
  Administered 2021-01-29 – 2021-02-01 (×4): 40 mg via ORAL
  Filled 2021-01-28 (×4): qty 1

## 2021-01-28 MED ORDER — SODIUM CHLORIDE 0.9 % IV SOLN: INTRAVENOUS | Status: DC

## 2021-01-28 MED ORDER — ACETAMINOPHEN 325 MG PO TABS
650.0000 mg | ORAL_TABLET | Freq: Four times a day (QID) | ORAL | Status: DC | PRN
  Administered 2021-01-29 – 2021-02-01 (×9): 650 mg via ORAL
  Filled 2021-01-28 (×10): qty 2

## 2021-01-28 MED ORDER — ASPIRIN 325 MG PO TABS
325.0000 mg | ORAL_TABLET | Freq: Every day | ORAL | Status: DC
  Administered 2021-01-28: 325 mg via ORAL
  Filled 2021-01-28: qty 1

## 2021-01-28 MED ORDER — ALPRAZOLAM 0.5 MG PO TABS
0.5000 mg | ORAL_TABLET | Freq: Two times a day (BID) | ORAL | Status: DC
  Administered 2021-01-29 (×2): 0.5 mg via ORAL
  Filled 2021-01-28 (×2): qty 1

## 2021-01-28 MED ORDER — AMLODIPINE BESYLATE 5 MG PO TABS
5.0000 mg | ORAL_TABLET | Freq: Every day | ORAL | Status: DC
  Administered 2021-01-29: 5 mg via ORAL
  Filled 2021-01-28: qty 1

## 2021-01-28 MED ORDER — INSULIN ASPART 100 UNIT/ML IJ SOLN
0.0000 [IU] | Freq: Three times a day (TID) | INTRAMUSCULAR | Status: DC
  Administered 2021-01-30 – 2021-01-31 (×4): 1 [IU] via SUBCUTANEOUS
  Administered 2021-02-01 (×2): 2 [IU] via SUBCUTANEOUS

## 2021-01-28 NOTE — H&P (Signed)
History and Physical    MACKENA PLUMMER WLS:[id number] DOB: 1951-03-03 DOA: 01/28/2021  PCP: Maurice Small, MD   Patient coming from: Home  Chief Complaint: fatigue, weakness  HPI: Shirley Juarez is a 70 y.o. female with medical history significant for HTN, DMT2, depression/anxiety, CAD, OA who presents for evaluation of generalized weakness and fatigue.  Reportedly patient has been weaker than normal the last 4 to 5 days and occasionally has been dizzy when she first sits up.  Husband states that she has not been out of bed in the last 2 days.  Her daughter was at the beach and vacation and came back this morning because her mom did not sound good to her when she spoke to her on the phone yesterday and wanted to come to the emergency room for evaluation.  He was seen a few weeks ago for urinary retention that required Foley placement by urology secondary to scar tissue in her bladder.  She has no urinary complaints now and has been urinating normally at home.  She has chronic nausea and takes Zofran for it.  When she was admitted previously her Effexor was stopped but she started taking it again when she went home she states.  She reports she does not have any abdominal pain, diarrhea, dysuria, chest pain or palpitations.  She has not had any syncope or seizure activity.  She has not had any black tarry stool or blood in her stool.  She denies any bruising or bleeding.  She has had decreased p.o. intake for the last 24 hours not feeling hungry and not be able to get up out of bed to get anything.  Had any fevers.  No known sick contacts or COVID-19 exposures.  ED Course: In the emergency room she is found to have low sodium, low potassium and low magnesium levels.  Is at all been started to be repleted in the emergency room.  Patient is too weak to ambulate and is not safe to go home.  She has been hemodynamically stable in the emergency room.  Labs show WBC of 10,000, hemoglobin 7.4 which is down from the  around 8.5 a few weeks ago, hematocrit 22.5 platelets 344,000.  Sodium 123 potassium 2.5 chloride 86 bicarb 23 creatinine 1.47 BUN 29 magnesium 1.6 alk phosphatase 63 AST 27 ALT 17 lipase 25.  Initial troponin was 20.  Rechecked was 17.  Patient has no complaints of chest pain. Hospitalist service asked to admit for further management  Review of Systems:  General: Reports weakness and decreased appetite. Denies fever, chills, weight loss, night sweats. Reports dizziness.  HENT: Denies head trauma, headache, denies change in hearing, tinnitus.  Denies nasal congestion or bleeding.  Denies sore throat, sores in mouth.  Denies difficulty swallowing Eyes: Denies blurry vision, pain in eye, drainage.  Denies discoloration of eyes. Neck: Denies pain.  Denies swelling.  Denies pain with movement. Cardiovascular: Denies chest pain, palpitations.  Denies edema.  Denies orthopnea Respiratory: Denies shortness of breath, cough.  Denies wheezing.  Denies sputum production Gastrointestinal: Denies abdominal pain, swelling. Denies nausea, vomiting, diarrhea. Denies melena. Denies hematemesis. Musculoskeletal: Denies limitation of movement.  Denies deformity or swelling.  Denies pain.  Denies arthralgias or myalgias. Genitourinary: Denies pelvic pain.  Denies urinary frequency or hesitancy.  Denies dysuria.  Skin: Denies rash.  Denies petechiae, purpura, ecchymosis. Neurological: Denies syncope. Denies seizure activity. Denies paresthesia. Denies slurred speech, drooping face. Denies visual change. Psychiatric: Denies anxiety. Denies hallucinations.  Past  Medical History:  Diagnosis Date   Anxiety    Arthritis    Osteoarthritis- knee and back   Coronary artery disease    Cath 2004 in [place], [place] with minor CAD   Depression    Diabetes mellitus    On oral and diet   GERD (gastroesophageal reflux disease)    Hyperlipidemia    Hypertension    Sleep apnea    does not use CPAP- it is broken.  Last sleep  study was 7 years ago.  Has not worn in  4  years    Past Surgical History:  Procedure Laterality Date   ABDOMINAL HYSTERECTOMY     KNEE ARTHROCENTESIS     TOTAL KNEE ARTHROPLASTY  06/22/2011   Bilateral     Social History  reports that she has never smoked. She has never used smokeless tobacco. She reports that she does not drink alcohol and does not use drugs.  Allergies  Allergen Reactions   Latex Rash   Codeine Nausea And Vomiting   Crestor [Rosuvastatin Calcium] Nausea And Vomiting   Fish Oil Nausea And Vomiting   Hydralazine Other (See Comments) and Cough    100 mg three times a day causes excessive coughing   Lipitor [Atorvastatin Calcium] Nausea And Vomiting   Peanut-Containing Drug Products Hives   Statins Nausea And Vomiting   Welchol [Colesevelam Hcl] Nausea And Vomiting   Zithromax [Azithromycin] Nausea And Vomiting   Other Other (See Comments)    Pet dander    Family History  Problem Relation Age of Onset   Heart attack Mother 62   Liver disease Father    Heart attack Son        Age 33, he is our patient   Anesthesia problems Neg Hx      Prior to Admission medications   Medication Sig Start Date End Date Taking? Authorizing Provider  acetaminophen (TYLENOL) 325 MG tablet Take 325-650 mg by mouth every 6 (six) hours as needed for mild pain or headache.    [provider]  ALPRAZolam Duanne Moron) 0.5 MG tablet Take 0.5 mg by mouth in the morning and at bedtime.    [provider]  amoxicillin (AMOXIL) 500 MG capsule Take 1,000-2,000 mg by mouth See admin instructions. Take 2,000 mg by mouth one hour prior to dental procedures and 1,000 mg once daily the day after    [provider]  aspirin EC 81 MG tablet Take 81 mg by mouth at bedtime.      [provider]  Calcium Citrate-Vitamin D (CALCIUM CITRATE + D3 PO) Take 1 tablet by mouth every Monday, Wednesday, and Friday.    [provider]  cetirizine (ZYRTEC) 10 MG  tablet Take 10 mg by mouth every morning.      [provider]  estradiol (ESTRACE) 2 MG tablet Take 1 mg by mouth 2 (two) times a week.    [provider]  gemfibrozil (LOPID) 600 MG tablet Take 600 mg by mouth 2 (two) times daily before a meal.     [provider]  hydrALAZINE (APRESOLINE) 100 MG tablet Take 1 tablet (100 mg total) by mouth in the morning and at bedtime. 01/13/21   Geradine Girt, DO  hydrOXYzine (ATARAX/VISTARIL) 25 MG tablet Take 25 mg by mouth at bedtime.      [provider]  lansoprazole (PREVACID) 30 MG capsule Take 30 mg by mouth daily before breakfast.    [provider]  metFORMIN (GLUCOPHAGE)  500 MG tablet Take 1,000 mg by mouth daily with breakfast.    [provider]  ondansetron (ZOFRAN) 8 MG tablet Take 8 mg by mouth daily as needed for nausea or vomiting.    [provider]  venlafaxine XR (EFFEXOR-XR) 150 MG 24 hr capsule Take 150 mg by mouth at bedtime.    [provider]    Physical Exam: Vitals:   01/28/21 1930 01/28/21 2000 01/28/21 2030 01/28/21 2100  BP: 139/62 127/65 140/63 (!) 144/60  Pulse: 79 74 73 78  Resp: _0 Temp:      TempSrc:      SpO2: 96% 97% 97% 98%    Constitutional: NAD, calm, comfortable Vitals:   01/28/21 1930 01/28/21 2000 01/28/21 2030 01/28/21 2100  BP: 139/62 127/65 140/63 (!) 144/60  Pulse: 79 74 73 78  Resp: _1 Temp:      TempSrc:      SpO2: 96% 97% 97% 98%   General: WDWN, Alert and oriented x3.  Eyes: EOMI, PERRL, conjunctivae pale. Sclera nonicteric HENT:  [place]/AT, external ears normal.  Nares patent without epistasis.  Mucous membranes are dry. Posterior pharynx clear Neck: Soft, normal range of motion, supple, no masses, Trachea midline Respiratory: clear to auscultation bilaterally, no wheezing, no crackles. Normal respiratory effort. No accessory muscle use.  Cardiovascular: Regular rate and rhythm, no murmurs / rubs /  gallops. No extremity edema. 1+ pedal pulses.   Abdomen: Soft, no tenderness, nondistended, no rebound or guarding.  No masses palpated. Bowel sounds normoactive Musculoskeletal: FROM. no cyanosis. Normal muscle tone.  Skin: Warm, dry, intact no rashes, lesions, ulcers. No induration Neurologic: CN 2-12 grossly intact.  Normal speech.  Sensation intact to touch. Grip Strength 4/5. Lower extremity strength 3/5   Psychiatric: Normal judgment and insight. Depressed mood and flat affect.    Labs on Admission: I have personally reviewed following labs and imaging studies  CBC: Recent Labs  Lab 01/28/21 1851  WBC 10.0  HGB 7.4*  HCT 22.5*  MCV 72.3*  PLT 097    Basic Metabolic Panel: Recent Labs  Lab 01/28/21 1555 01/28/21 1851  NA 123*  --   K 2.5* 2.7*  CL 86*  --   CO2 23  --   GLUCOSE 175*  --   BUN 29*  --   CREATININE 1.47*  --   CALCIUM 8.4*  --   MG 1.6*  --     GFR: CrCl cannot be calculated (Unknown ideal weight.).  Liver Function Tests: Recent Labs  Lab 01/28/21 1555  AST 27  ALT 17  ALKPHOS 63  BILITOT 0.6  PROT 6.9  ALBUMIN 3.1*    Urine analysis:    Component Value Date/Time   COLORURINE YELLOW 01/12/2021 1448   APPEARANCEUR HAZY (A) 01/12/2021 1448   LABSPEC 1.009 01/12/2021 1448   PHURINE 8.0 01/12/2021 1448   GLUCOSEU NEGATIVE 01/12/2021 1448   HGBUR SMALL (A) 01/12/2021 1448   BILIRUBINUR NEGATIVE 01/12/2021 1448   KETONESUR NEGATIVE 01/12/2021 1448   PROTEINUR NEGATIVE 01/12/2021 1448   UROBILINOGEN 0.2 06/07/2008 1545   NITRITE NEGATIVE 01/12/2021 1448   LEUKOCYTESUR LARGE (A) 01/12/2021 1448    Radiological Exams on Admission: DG Chest 2 View  Result Date: 01/28/2021 CLINICAL DATA:  Weakness.  Generalized fatigue and nausea. EXAM: CHEST - 2 VIEW COMPARISON:  February 05, 2016 FINDINGS: The heart size and mediastinal contours are within normal limits. Both lungs are clear. The visualized  skeletal structures are unremarkable.  IMPRESSION: No active cardiopulmonary disease. Electronically Signed   By: Dorise Bullion III M.D.   On: 01/28/2021 16:17   CT HEAD WO CONTRAST (5MM)  Result Date: 01/28/2021 CLINICAL DATA:  Delirium, weakness, and fatigue. EXAM: CT HEAD WITHOUT CONTRAST TECHNIQUE: Contiguous axial images were obtained from the base of the skull through the vertex without intravenous contrast. COMPARISON:  CT head dated February 06, 2016. FINDINGS: Brain: No evidence of acute infarction, hemorrhage, hydrocephalus, extra-axial collection or mass lesion/mass effect. Stable mild chronic microvascular ischemic changes. Vascular: Calcified atherosclerosis at the skullbase. No hyperdense vessel. Skull: Normal. Negative for fracture or focal lesion. Sinuses/Orbits: No acute finding. Other: None. IMPRESSION: 1. No acute intracranial abnormality. Electronically Signed   By: Titus Dubin M.D.   On: 01/28/2021 16:48    EKG: Independently reviewed.  EKG shows sinus rhythm.  No acute ST elevation or depression.  QTc prolonged at 523  Assessment/Plan Principal Problem:   Hypokalemia Ms. Devivo is admitted to telemetry floor.  Potassium being replaced. Given run of potassium in ER. Will provide total of 50 mEq.  Recheck electrolytes in am  Active Problems:   Hyponatremia Sodium level low at 123 IVF with LR at 75 ml/hr.  Recheck electrolytes in am.     Essential hypertension Hydralazine is listed on patient's medication list but is also listed in her allergies.  Will use Norvasc for blood pressure control.  Monitor blood pressure    Type 2 diabetes mellitus with hemoglobin A1c goal of less than 7.5% Patient is on metformin which will be continued.  Monitor blood sugars with meals and at bedtime.  Corrective insulin provided as needed for glycemic control.  Check hemoglobin A1c    Hypomagnesemia Replaced. Recheck magnesium level in am.    Microcytic anemia Iron studies ordered in ER and will be monitored.  Check  hemoccult of stools    CKD (chronic kidney disease) stage 3, GFR 30-59 ml/min  Monitor renal function with labs in am.     Generalized weakness Consult PT in am    Prolonged QT interval Avoid medications which could further prolong QT interval.    Depression with anxiety SNRI therapy is held as it could be contributing to hyponatremia    DVT prophylaxis: Heparin for DVT prophylaxis  Code Status:   Full Code  Family Communication:  Diagnosis and plan discussed with patient and her husband who is at bedside.  They verbalized understanding agree with plan.  Further recommendations to follow as clinical indicated Disposition Plan:   Patient is from:  Home  Anticipated DC to:  Home versus rehab, to be determined  Anticipated DC date:  Anticipate 2 midnight or more stay in hospital  Admission status:  Inpatient   Yevonne Aline Chotiner MD [facility]  How to contact the [facility] Attending or Consulting provider [place] or covering provider during after hours [place], for this patient?   Check the care team in [facility] and look for a) attending/consulting [facility] provider listed and b) the [facility] team listed Log into [email or web address] and use [place]'s universal password to access. If you do not have the password, please contact the hospital operator. Locate the [facility] provider you are looking for under [facility] and page to a number that you can be directly reached. If you still have difficulty reaching the provider, please page the [facility] (Director on Call) for the Hospitalists listed on amion for assistance.  01/28/2021, 10:22 PM

## 2021-01-28 NOTE — ED Provider Notes (Signed)
[facility]-EMERGENCY DEPT Provider Note   CSN: [id number] Arrival date & time: 01/28/21  1444     History Chief Complaint  Patient presents with   Fatigue   Nausea    Shirley Juarez is a 70 y.o. female.  70 year old female with history as below presented to ER secondary to generalized malaise, fatigue, weakness, "head sloshing around feeling" the past 4- 5 days.  Patient was recently emergency permit secondary to urinary retention, Foley catheter was placed but was later removed.  This secondary to scar tissue on her bladder per the patient.  She has no urinary complaints today.  Has been urinating appropriately the past few days.  She is concerned that when she was kept overnight she was not given her Effexor and believes potentially this was contributing to her symptoms today.  She has some mild nausea associated with dizziness which is improved after taking oral Zofran approxi-1 hour prior to arrival.  Numbness or tingling, no head injury, no abdominal pain.  No change to bowel or bladder function.  Reduced oral intake over the past 24 hours secondary to poor appetite.  Patient with difficulty driving ambulating over the past few days secondary to dizziness, weakness.  No fevers, chills, sick contacts or recent travel.  Symptoms have been progressively worsening over the past 4 to 5 days.  Good compliance with home medications     The history is provided by the patient and the spouse. No language interpreter was used.      Past Medical History:  Diagnosis Date   Anxiety    Arthritis    Osteoarthritis- knee and back   Coronary artery disease    [place]   Depression    Diabetes mellitus    On oral and diet   GERD (gastroesophageal reflux disease)    Hyperlipidemia    Hypertension    Sleep apnea    does not use CPAP- it is broken.  Last sleep study was 7 years ago.  Has not worn in  4  years    Patient Active Problem  List   Diagnosis Date Noted   Hyponatremia 01/28/2021   Hypomagnesemia 01/28/2021   Microcytic anemia 01/28/2021   Prolonged QT interval 01/28/2021   CKD (chronic kidney disease) stage 3, GFR 30-59 ml/min (HCC) 01/28/2021   Generalized weakness 01/28/2021   Depression with anxiety 01/28/2021   ARF (acute renal failure) (HCC) 01/12/2021   Type 2 diabetes mellitus with hemoglobin A1c goal of less than 7.5% (HCC)    Headache    Hypokalemia    Pain in the chest 02/06/2016   Hypercalcemia 02/06/2016   GERD (gastroesophageal reflux disease) 02/06/2016   Coronary atherosclerosis of native coronary artery 09/04/2012   Essential hypertension 09/04/2012   Hyperlipidemia 09/04/2012    Past Surgical History:  Procedure Laterality Date   ABDOMINAL HYSTERECTOMY     KNEE ARTHROCENTESIS     TOTAL KNEE ARTHROPLASTY  06/22/2011   Bilateral      OB History   No obstetric history on file.     Family History  Problem Relation Age of Onset   Heart attack Mother [id number]   Liver disease Father    Heart attack Son        Age 71, he is our patient   Anesthesia problems Neg Hx     Social History   Tobacco Use   Smoking status: Never   Smokeless tobacco: Never  Substance Use  Topics   Alcohol use: No   Drug use: No    Home Medications Prior to Admission medications   Medication Sig Start Date End Date Taking? Authorizing Provider  acetaminophen (TYLENOL) 325 MG tablet Take 325-650 mg by mouth every 6 (six) hours as needed for mild pain or headache.   Yes [provider]  ALPRAZolam Prudy Feeler) 0.5 MG tablet Take 0.5 mg by mouth in the morning and at bedtime.   Yes [provider]  aspirin EC 81 MG tablet Take 81 mg by mouth at bedtime.     Yes [provider]  Calcium Citrate-Vitamin D (CALCIUM CITRATE + D3 PO) Take 1 tablet by mouth every Monday, Wednesday, and Friday.   Yes [provider]  cetirizine (ZYRTEC) 10 MG tablet Take 10 mg by mouth every morning.      Yes [provider]  estradiol (ESTRACE) 2 MG tablet Take 1 mg by mouth 2 (two) times a week.   Yes [provider]  gemfibrozil (LOPID) 600 MG tablet Take 600 mg by mouth 2 (two) times daily before a meal.    Yes [provider]  hydrALAZINE (APRESOLINE) 100 MG tablet Take 1 tablet (100 mg total) by mouth in the morning and at bedtime. 01/13/21  Yes Marlin Canary U, DO  hydrOXYzine (ATARAX/VISTARIL) 25 MG tablet Take 25 mg by mouth at bedtime.     Yes [provider]  lansoprazole (PREVACID) 30 MG capsule Take 30 mg by mouth daily before breakfast.   Yes [provider]  metFORMIN (GLUCOPHAGE) 500 MG tablet Take 1,000 mg by mouth daily with breakfast.   Yes [provider]  ondansetron (ZOFRAN) 8 MG tablet Take 8 mg by mouth daily as needed for nausea or vomiting.   Yes [provider]  venlafaxine XR (EFFEXOR-XR) 150 MG 24 hr capsule Take 150 mg by mouth at bedtime.   Yes [provider]  amoxicillin (AMOXIL) 500 MG capsule Take 1,000-2,000 mg by mouth See admin instructions. Take 2,000 mg by mouth one hour prior to dental procedures and 1,000 mg once daily the day after    [provider]    Allergies    Latex, Codeine, Crestor [rosuvastatin calcium], Fish oil, Hydralazine, Lipitor [atorvastatin calcium], Peanut-containing drug products, Statins, Welchol [colesevelam hcl], Zithromax [azithromycin], and Other  Review of Systems   Review of Systems  Constitutional:  Positive for appetite change and fatigue. Negative for chills and fever.  HENT:  Negative for facial swelling and trouble swallowing.   Eyes:  Negative for photophobia and visual disturbance.  Respiratory:  Negative for cough and shortness of breath.   Cardiovascular:  Negative for chest pain and palpitations.  Gastrointestinal:  Negative for abdominal pain, nausea and vomiting.  Endocrine: Negative for polydipsia and polyuria.  Genitourinary:   Negative for difficulty urinating and hematuria.  Musculoskeletal:  Negative for gait problem and joint swelling.  Skin:  Negative for pallor and rash.  Neurological:  Positive for dizziness and weakness. Negative for syncope and headaches.  Psychiatric/Behavioral:  Negative for agitation and confusion.    Physical Exam Updated Vital Signs BP (!) 155/70   Pulse 100   Temp 97.8 F (36.6 C) (Oral)   Resp 18   Ht 5' (1.524 m)   Wt 71 kg   SpO2 99%   BMI 30.57 kg/m   Physical Exam Vitals and nursing note reviewed.  Constitutional:      General: She is not in acute distress.  Appearance: Normal appearance.  HENT:     Head: Normocephalic and atraumatic.     Right Ear: External ear normal.     Left Ear: External ear normal.     Nose: Nose normal.     Mouth/Throat:     Mouth: Mucous membranes are dry.  Eyes:     General: No scleral icterus.       Right eye: No discharge.        Left eye: No discharge.     Extraocular Movements: Extraocular movements intact.     Pupils: Pupils are equal, round, and reactive to light.     Comments: Fatigable horizontal nystagmus to the left  Cardiovascular:     Rate and Rhythm: Normal rate and regular rhythm.     Pulses: Normal pulses.     Heart sounds: Normal heart sounds.  Pulmonary:     Effort: Pulmonary effort is normal. No respiratory distress.     Breath sounds: Normal breath sounds.  Abdominal:     General: Abdomen is flat.     Palpations: Abdomen is soft.     Tenderness: There is no abdominal tenderness.  Musculoskeletal:        General: Normal range of motion.     Cervical back: Normal range of motion.     Right lower leg: No edema.     Left lower leg: No edema.  Skin:    General: Skin is warm and dry.     Capillary Refill: Capillary refill takes less than 2 seconds.  Neurological:     Mental Status: She is alert and oriented to person, place, and time.     GCS: GCS eye subscore is 4. GCS verbal subscore is 5. GCS motor  subscore is 6.     Cranial Nerves: Cranial nerves are intact. No facial asymmetry.     Sensory: Sensation is intact.     Motor: Motor function is intact. No tremor.     Coordination: Coordination is intact. Finger-Nose-Finger Test normal.  Psychiatric:        Mood and Affect: Mood normal.        Behavior: Behavior normal.    ED Results / Procedures / Treatments   Labs (all labs ordered are listed, but only abnormal results are displayed) Labs Reviewed  COMPREHENSIVE METABOLIC PANEL - Abnormal; Notable for the following components:      Result Value   Sodium 123 (*)    Potassium 2.5 (*)    Chloride 86 (*)    Glucose, Bld 175 (*)    BUN 29 (*)    Creatinine, Ser 1.47 (*)    Calcium 8.4 (*)    Albumin 3.1 (*)    GFR, Estimated 38 (*)    All other components within normal limits  SALICYLATE LEVEL - Abnormal; Notable for the following components:   Salicylate Lvl <7.0 (*)    All other components within normal limits  ACETAMINOPHEN LEVEL - Abnormal; Notable for the following components:   Acetaminophen (Tylenol), Serum <10 (*)    All other components within normal limits  OSMOLALITY - Abnormal; Notable for the following components:   Osmolality 266 (*)    All other components within normal limits  MAGNESIUM - Abnormal; Notable for the following components:   Magnesium 1.6 (*)    All other components within normal limits  POTASSIUM - Abnormal; Notable for the following components:   Potassium 2.7 (*)    All other components within normal limits  CBC WITH  DIFFERENTIAL/PLATELET - Abnormal; Notable for the following components:   RBC 3.11 (*)    Hemoglobin 7.4 (*)    HCT 22.5 (*)    MCV 72.3 (*)    MCH 23.8 (*)    RDW 15.9 (*)    All other components within normal limits  IRON AND TIBC - Abnormal; Notable for the following components:   Iron 11 (*)    Saturation Ratios 3 (*)    All other components within normal limits  GLUCOSE, CAPILLARY - Abnormal; Notable for the  following components:   Glucose-Capillary 135 (*)    All other components within normal limits  TROPONIN I (HIGH SENSITIVITY) - Abnormal; Notable for the following components:   Troponin I (High Sensitivity) 20 (*)    All other components within normal limits  RESP PANEL BY RT-PCR (FLU A&B, COVID) ARPGX2  TSH  LIPASE, BLOOD  FERRITIN  LACTATE DEHYDROGENASE  URINALYSIS, ROUTINE W REFLEX MICROSCOPIC  HEMOGLOBIN A1C  CBC  MAGNESIUM  BASIC METABOLIC PANEL  TROPONIN I (HIGH SENSITIVITY)    EKG EKG Interpretation  Date/Time:  Saturday January 28 2021 16:37:57 EDT Ventricular Rate:  90 PR Interval:  126 QRS Duration: 111 QT Interval:  427 QTC Calculation: 523 R Axis:   14 Text Interpretation: Sinus arrhythmia Prolonged QT interval Similar to prior tracing Confirmed by Tanda Rockers ([id number]) on 01/28/2021 4:45:17 PM  Radiology DG Chest 2 View  Result Date: 01/28/2021 CLINICAL DATA:  Weakness.  Generalized fatigue and nausea. EXAM: CHEST - 2 VIEW COMPARISON:  February 05, 2016 FINDINGS: The heart size and mediastinal contours are within normal limits. Both lungs are clear. The visualized skeletal structures are unremarkable. IMPRESSION: No active cardiopulmonary disease. Electronically Signed   By: Gerome Sam III M.D.   On: 01/28/2021 16:17   CT HEAD WO CONTRAST ( )  Result Date: 01/28/2021 CLINICAL DATA:  Delirium, weakness, and fatigue. EXAM: CT HEAD WITHOUT CONTRAST TECHNIQUE: Contiguous axial images were obtained from the base of the skull through the vertex without intravenous contrast. COMPARISON:  CT head dated February 06, 2016. FINDINGS: Brain: No evidence of acute infarction, hemorrhage, hydrocephalus, extra-axial collection or mass lesion/mass effect. Stable mild chronic microvascular ischemic changes. Vascular: Calcified atherosclerosis at the skullbase. No hyperdense vessel. Skull: Normal. Negative for fracture or focal lesion. Sinuses/Orbits: No acute finding. Other: None.  IMPRESSION: 1. No acute intracranial abnormality. Electronically Signed   By: Obie Dredge M.D.   On: 01/28/2021 16:48    Procedures Procedures   Medications Ordered in ED Medications  0.9 %  sodium chloride infusion ( Intravenous New Bag/Given 01/28/21 1750)  aspirin tablet 325 mg (325 mg Oral Given 01/28/21 1749)  pantoprazole (PROTONIX) injection 40 mg (has no administration in time range)  insulin aspart (novoLOG) injection 0-9 Units (has no administration in time range)  insulin aspart (novoLOG) injection 0-5 Units (0 Units Subcutaneous Not Given 01/28/21 2331)  heparin injection 5,000 Units (5,000 Units Subcutaneous Not Given 01/28/21 2332)  potassium chloride 10 mEq in 100 mL IVPB (10 mEq Intravenous New Bag/Given 01/28/21 2337)  lactated ringers infusion ( Intravenous New Bag/Given 01/28/21 2302)  acetaminophen (TYLENOL) tablet 650 mg (has no administration in time range)    Or  acetaminophen (TYLENOL) suppository 650 mg (has no administration in time range)  trimethobenzamide (TIGAN) injection 200 mg (has no administration in time range)  aspirin EC tablet 81 mg (81 mg Oral Not Given 01/28/21 2336)  ALPRAZolam Prudy Feeler) tablet 0.5 mg (0.5 mg Oral Not Given 01/28/21 2332)  hydrOXYzine (ATARAX/VISTARIL) tablet 25 mg (25 mg Oral Not Given 01/28/21 2337)  metFORMIN (GLUCOPHAGE) tablet 1,000 mg (has no administration in time range)  pantoprazole (PROTONIX) EC tablet 40 mg (has no administration in time range)  amLODipine (NORVASC) tablet 5 mg (5 mg Oral Not Given 01/28/21 2336)  sodium chloride 0.9 % bolus 1,000 mL (0 mLs Intravenous Stopped 01/28/21 1822)  meclizine (ANTIVERT) tablet 25 mg (25 mg Oral Given 01/28/21 1635)  magnesium sulfate IVPB 1 g 100 mL (0 g Intravenous Stopped 01/28/21 1740)  potassium chloride 10 mEq in 100 mL IVPB (0 mEq Intravenous Stopped 01/28/21 2201)    ED Course  I have reviewed the triage vital signs and the nursing notes.  Pertinent labs & imaging results that  were available during my care of the patient were reviewed by me and considered in my medical decision making (see chart for details).    MDM Rules/Calculators/A&P                           This patient complains of weakness, malaise, dizziness x4-5 days; this involves an extensive number of treatment Options and is a complaint that carries with it a high risk of complications and Morbidity.  Vital signs reviewed and are stable.  She is nontoxic-appearing.  Neurologic exam is nonfocal.  Serious etiologies considered.    Labs reviewed, patient with hypokalemia at 2.5.  Started on oral and intravenous replacement.  No ECG changes.  Magnesium is also low.  Replaced intravenously.  Patient with poor oral intake over the past few days favor this is etiology of electrolyte abnormalities.  Patient with sodium of 123.  IVF replacement in progress.  Also found to have a hemoglobin 7.4.  Baseline approximately 8, although in the past up to 15. Denies black-colored stool or blood per rectum.  No hematemesis or increased bruising.  She has history of microcytic anemia.  Is not taking iron supplementation because it makes her constipated. No frank bleeding on exam.   ECG without evidence of acute ischemia.  Initially with a mild troponin leak at 20 although repeat troponin is now normalized.  She has no ongoing chest pain.  She has no abnormalities on her chest x-ray.  Favor demand ischemia as etiology of troponin leak.   Imaging reviewed and is stable.  Patient with critical hypokalemia, hypomagnesemia, anemia.  Legrand Rams this is etiology of patient's generalized weakness and fatigue.  Recommend admission for electrolyte replacement, telemetry monitoring.  Patient and family agreeable.      CRITICAL CARE Performed by: Sloan Leiter   Total critical care time: 32 minutes  Critical care time was exclusive of separately billable procedures and treating other patients.  Critical care was necessary to  treat or prevent imminent or life-threatening deterioration.  Critical care was time spent personally by me on the following activities: development of treatment plan with patient and/or surrogate as well as nursing, discussions with consultants, evaluation of patient's response to treatment, examination of patient, obtaining history from patient or surrogate, ordering and performing treatments and interventions, ordering and review of laboratory studies, ordering and review of radiographic studies, pulse oximetry and re-evaluation of patient's condition.  Final Clinical Impression(s) / ED Diagnoses Final diagnoses:  Hypokalemia  Fatigue, unspecified type  NSTEMI (non-ST elevated myocardial infarction) (HCC)  Hypomagnesemia  Hyponatremia  Mild dehydration  Anemia, unspecified type    Rx / DC Orders ED Discharge Orders     None  Sloan Leiter, DO 01/29/21 [phone]

## 2021-01-28 NOTE — ED Triage Notes (Signed)
Pt reports generalized fatigue and nausea for 3-4 days. Pt reports having a urology procedure performed on 8/11 when she was last here. Pt denies any new urinary issues.

## 2021-01-28 NOTE — Progress Notes (Signed)
Patient arrived to unit from ED. Alert and oriented x4. Oriented to unit will monitor.

## 2021-01-28 NOTE — ED Notes (Signed)
ED TO INPATIENT HANDOFF REPORT  Name/Age/Gender Shirley Juarez 70 y.o. female  Code Status Code Status History    Date Active Date Inactive Code Status Order ID Comments User Context   01/12/2021 2003 01/13/2021 2356 Full Code [id number]  Eduard Clos, MD ED   02/06/2016 0337 02/08/2016 1529 Full Code [id number]  Clydie Braun, MD Inpatient   06/22/2011 1129 06/25/2011 1528 Full Code [id number]  Hilarie Fredrickson, RN Inpatient    Questions for Most Recent Historical Code Status (Order [id number])       Home/SNF/Other Home  Chief Complaint Hypokalemia [E87.6]  Level of Care/Admitting Diagnosis ED Disposition    ED Disposition  Admit   Condition  --   Comment  Hospital Area: [facility] [[id number]]  Level of Care: Telemetry [5]  Admit to tele based on following criteria: Complex arrhythmia (Bradycardia/Tachycardia)  Admit to tele based on following criteria: Other see comments  Admit to tele based on following criteria: Monitor QTC interval  Comments: hypokalemia  May admit patient to [facility] if equivalent level of care is available:: Yes  Covid Evaluation: Confirmed COVID Negative  Diagnosis: Hypokalemia [662947]  Admitting Physician: Carlton Adam [[id number]]  Attending Physician: Carlton Adam [[id number]]  Estimated length of stay: past midnight tomorrow  Certification:: I certify this patient will need inpatient services for at least 2 midnights         Medical History Past Medical History:  Diagnosis Date  . Anxiety   . Arthritis    Osteoarthritis- knee and back  . Coronary artery disease    Cath 2004 in [place], [place] with minor CAD  . Depression   . Diabetes mellitus    On oral and diet  . GERD (gastroesophageal reflux disease)   . Hyperlipidemia   . Hypertension   . Sleep apnea    does not use CPAP- it is broken.  Last sleep study was 7 years ago.  Has not worn in  4  years    Allergies Allergies   Allergen Reactions  . Latex Rash  . Codeine Nausea And Vomiting  . Crestor [Rosuvastatin Calcium] Nausea And Vomiting  . Fish Oil Nausea And Vomiting  . Hydralazine Other (See Comments) and Cough    100 mg three times a day causes excessive coughing  . Lipitor [Atorvastatin Calcium] Nausea And Vomiting  . Peanut-Containing Drug Products Hives  . Statins Nausea And Vomiting  . Welchol [Colesevelam Hcl] Nausea And Vomiting  . Zithromax [Azithromycin] Nausea And Vomiting  . Other Other (See Comments)    Pet dander    IV Location/Drains/Wounds Patient Lines/Drains/Airways Status    Active Line/Drains/Airways    Name Placement date Placement time Site Days   Peripheral IV 01/12/21 20 G Left Antecubital 01/12/21  2107  Antecubital  16   Peripheral IV 01/28/21 20 G Right Antecubital 01/28/21  1638  Antecubital  less than 1   Urethral Catheter News[place], MD Latex 20 Fr. 01/12/21  1907  Latex  16   Incision 06/22/11 Leg Right 06/22/11  0805  -- 3508          Labs/Imaging Results for orders placed or performed during the hospital encounter of 01/28/21 (from the past 48 hour(s))  TSH     Status: None   Collection Time: 01/28/21  3:55 PM  Result Value Ref Range   TSH 0.735 0.350 - 4.500 uIU/mL    Comment: Performed by a 3rd Generation assay with a  functional sensitivity of <=0.01 uIU/mL. Performed at [facility], [address], [place], [place] [address]   Troponin I (High Sensitivity)     Status: Abnormal   Collection Time: 01/28/21  3:55 PM  Result Value Ref Range   Troponin I (High Sensitivity) 20 (H) <18 ng/L    Comment: (NOTE) Elevated high sensitivity troponin I (hsTnI) values and significant  changes across serial measurements may suggest ACS but many other  chronic and acute conditions are known to elevate hsTnI results.  Refer to the Links section for chest pain algorithms and additional  guidance. Performed at [facility], [address] [place], [place] [address]   Resp Panel by RT-PCR (Flu A&B, Covid) Nasopharyngeal Swab     Status: None   Collection Time: 01/28/21  3:55 PM   Specimen: Nasopharyngeal Swab; Nasopharyngeal(NP) swabs in vial transport medium  Result Value Ref Range   SARS Coronavirus 2 by RT PCR NEGATIVE NEGATIVE    Comment: (NOTE) SARS-CoV-2 target nucleic acids are NOT DETECTED.  The SARS-CoV-2 RNA is generally detectable in upper respiratory specimens during the acute phase of infection. The lowest concentration of SARS-CoV-2 viral copies this assay can detect is 138 copies/mL. A negative result does not preclude SARS-Cov-2 infection and should not be used as the Juarez basis for treatment or other patient management decisions. A negative result may occur with  improper specimen collection/handling, submission of specimen other than nasopharyngeal swab, presence of viral mutation(s) within the areas targeted by this assay, and inadequate number of viral copies(<138 copies/mL). A negative result must be combined with clinical observations, patient history, and epidemiological information. The expected result is Negative.  Fact Sheet for Patients:  BloggerCourse.com  Fact Sheet for Healthcare Providers:  SeriousBroker.it  This test is no t yet approved or cleared by the [place] FDA and  has been authorized for detection and/or diagnosis of SARS-CoV-2 by FDA under an Emergency Use Authorization (EUA). This EUA will remain  in effect (meaning this test can be used) for the duration of the COVID-19 declaration under Section 564(b)(1) of the Act, 21 U.S.C.section 360bbb-3(b)(1), unless the authorization is terminated  or revoked sooner.       Influenza A by PCR NEGATIVE NEGATIVE   Influenza B by PCR NEGATIVE NEGATIVE    Comment: (NOTE) The Xpert Xpress SARS-CoV-2/FLU/RSV plus assay is intended as an aid in the diagnosis of influenza  from Nasopharyngeal swab specimens and should not be used as a Juarez basis for treatment. Nasal washings and aspirates are unacceptable for Xpert Xpress SARS-CoV-2/FLU/RSV testing.  Fact Sheet for Patients: BloggerCourse.com  Fact Sheet for Healthcare Providers: SeriousBroker.it  This test is not yet approved or cleared by the [place] FDA and has been authorized for detection and/or diagnosis of SARS-CoV-2 by FDA under an Emergency Use Authorization (EUA). This EUA will remain in effect (meaning this test can be used) for the duration of the COVID-19 declaration under Section 564(b)(1) of the Act, 21 U.S.C. section 360bbb-3(b)(1), unless the authorization is terminated or revoked.  Performed at [facility], [address], [place], [place] [address]   Comprehensive metabolic panel     Status: Abnormal   Collection Time: 01/28/21  3:55 PM  Result Value Ref Range   Sodium 123 (L) 135 - 145 mmol/L   Potassium 2.5 (LL) 3.5 - 5.1 mmol/L    Comment: CRITICAL RESULT CALLED TO, READ BACK BY AND VERIFIED WITH: MATT, RN @ 1729 ON  01/28/2021 BY BROOKS, L    Chloride 86 (L) 98 - 111 mmol/L   CO2 23 22 - 32 mmol/L   Glucose, Bld 175 (H) 70 - 99 mg/dL    Comment: Glucose reference range applies only to samples taken after fasting for at least 8 hours.   BUN 29 (H) 8 - 23 mg/dL   Creatinine, Ser 5.72 (H) 0.44 - 1.00 mg/dL   Calcium 8.4 (L) 8.9 - 10.3 mg/dL   Total Protein 6.9 6.5 - 8.1 g/dL   Albumin 3.1 (L) 3.5 - 5.0 g/dL   AST 27 15 - 41 U/L   ALT 17 0 - 44 U/L   Alkaline Phosphatase 63 38 - 126 U/L   Total Bilirubin 0.6 0.3 - 1.2 mg/dL   GFR, Estimated 38 (L) >60 mL/min    Comment: (NOTE) Calculated using the CKD-EPI Creatinine Equation (2021)    Anion gap 14 5 - 15    Comment: Performed at [facility], [address] [place], [place] [address]  Lipase, blood     Status: None    Collection Time: 01/28/21  3:55 PM  Result Value Ref Range   Lipase 25 11 - 51 U/L    Comment: Performed at [facility], [address] [place], [place] [address]  Salicylate level     Status: Abnormal   Collection Time: 01/28/21  3:55 PM  Result Value Ref Range   Salicylate Lvl <7.0 (L) 7.0 - 30.0 mg/dL    Comment: Performed at [facility], [address] [place], [place] [address]  Acetaminophen level     Status: Abnormal   Collection Time: 01/28/21  3:55 PM  Result Value Ref Range   Acetaminophen (Tylenol), Serum <10 (L) 10 - 30 ug/mL    Comment: (NOTE) Therapeutic concentrations vary significantly. A range of 10-30 ug/mL  may be an effective concentration for many patients. However, some  are best treated at concentrations outside of this range. Acetaminophen concentrations >150 ug/mL at 4 hours after ingestion  and >50 ug/mL at 12 hours after ingestion are often associated with  toxic reactions.  Performed at [facility], [address], [place], [place] [address]   Osmolality     Status: Abnormal   Collection Time: 01/28/21  3:55 PM  Result Value Ref Range   Osmolality 266 (L) 275 - 295 mOsm/kg    Comment: Performed at [facility], [address] [place], [place] [address]  Magnesium     Status: Abnormal   Collection Time: 01/28/21  3:55 PM  Result Value Ref Range   Magnesium 1.6 (L) 1.7 - 2.4 mg/dL    Comment: Performed at [facility], [address] [place], [place] [address]  Troponin I (High Sensitivity)     Status: None   Collection Time: 01/28/21  6:20 PM  Result Value Ref Range   Troponin I (High Sensitivity) 17 <18 ng/L    Comment: (NOTE) Elevated high sensitivity troponin I (hsTnI) values and significant  changes across serial measurements may suggest ACS but many other  chronic and acute conditions are known to elevate hsTnI results.  Refer to the "Links" section for  chest pain algorithms and additional  guidance. Performed at [facility], [address], [place], [place] [address]   Potassium     Status: Abnormal   Collection Time: 01/28/21  6:51 PM  Result Value Ref Range   Potassium 2.7 (LL)  3.5 - 5.1 mmol/L    Comment: CRITICAL RESULT CALLED TO, READ BACK BY AND VERIFIED WITH: WALDERMILK,J RN @2116  ON 01/28/21 JACKSON,K Performed at [facility], [address], [place], [place]   CBC with Differential     Status: Abnormal   Collection Time: 01/28/21  6:51 PM  Result Value Ref Range   WBC 10.0 4.0 - 10.5 K/uL   RBC 3.11 (L) 3.87 - 5.11 MIL/uL   Hemoglobin 7.4 (L) 12.0 - 15.0 g/dL    Comment: Reticulocyte Hemoglobin testing may be clinically indicated, consider ordering this additional test 01/30/21    HCT 22.5 (L) 36.0 - 46.0 %   MCV 72.3 (L) 80.0 - 100.0 fL   MCH 23.8 (L) 26.0 - 34.0 pg   MCHC 32.9 30.0 - 36.0 g/dL   RDW [id number] (H) 62.0 - 35.5 %   Platelets 344 150 - 400 K/uL   nRBC 0.0 0.0 - 0.2 %   Neutrophils Relative % 84 %   Band Neutrophils 5 %   Lymphocytes Relative 8 %   Monocytes Relative 3 %   Eosinophils Relative 0 %   Basophils Relative 0 %   WBC Morphology NORMAL    RBC Morphology NORMAL    Smear Review DONE    nRBC NONE SEEN 0 /100 WBC   Metamyelocytes Relative NONE SEEN %   Myelocytes NONE SEEN %   Promyelocytes Relative NONE SEEN %   Blasts NONE SEEN %    Comment: Performed at [facility], [address] [place], [place]   DG Chest 2 View  Result Date: 01/28/2021 CLINICAL DATA:  Weakness.  Generalized fatigue and nausea. EXAM: CHEST - 2 VIEW COMPARISON:  February 05, 2016 FINDINGS: The heart size and mediastinal contours are within normal limits. Both lungs are clear. The visualized skeletal structures are unremarkable. IMPRESSION: No active cardiopulmonary disease. Electronically Signed   By: February 07, 2016 III M.D.   On: 01/28/2021  16:17   CT HEAD WO CONTRAST (01/30/2021)  Result Date: 01/28/2021 CLINICAL DATA:  Delirium, weakness, and fatigue. EXAM: CT HEAD WITHOUT CONTRAST TECHNIQUE: Contiguous axial images were obtained from the base of the skull through the vertex without intravenous contrast. COMPARISON:  CT head dated February 06, 2016. FINDINGS: Brain: No evidence of acute infarction, hemorrhage, hydrocephalus, extra-axial collection or mass lesion/mass effect. Stable mild chronic microvascular ischemic changes. Vascular: Calcified atherosclerosis at the skullbase. No hyperdense vessel. Skull: Normal. Negative for fracture or focal lesion. Sinuses/Orbits: No acute finding. Other: None. IMPRESSION: 1. No acute intracranial abnormality. Electronically Signed   By: February 08, 2016 M.D.   On: 01/28/2021 16:48    Pending Labs Unresulted Labs (From admission, onward)    Start     Ordered   01/28/21 2111  Iron and TIBC  ONCE - STAT,   STAT        01/28/21 2110   01/28/21 2111  Ferritin (Iron Binding Protein)  ONCE - STAT,   STAT        01/28/21 2110   01/28/21 2111  Lactate dehydrogenase  ONCE - STAT,   STAT        01/28/21 2110   01/28/21 1538  Urinalysis, Routine w reflex microscopic  Once,   STAT        01/28/21 1539          Vitals/Pain Today's Vitals   01/28/21 1930 01/28/21 2000 01/28/21 2030 01/28/21 2100  BP: 139/62 127/65 140/63 (!) 144/60  Pulse: 79 74 73 78  Resp: 16 16 15 16   Temp:      TempSrc:      SpO2: 96% 97% 97% 98%  PainSc:        Isolation Precautions No active isolations  Medications Medications  0.9 %  sodium chloride infusion ( Intravenous New Bag/Given 01/28/21 1750)  aspirin tablet 325 mg (325 mg Oral Given 01/28/21 1749)  pantoprazole (PROTONIX) injection 40 mg (has no administration in time range)  sodium chloride 0.9 % bolus 1,000 mL (0 mLs Intravenous Stopped 01/28/21 1822)  meclizine (ANTIVERT) tablet 25 mg (25 mg Oral Given 01/28/21 1635)  magnesium sulfate IVPB 1 g 100 mL (0 g  Intravenous Stopped 01/28/21 1740)  potassium chloride 10 mEq in 100 mL IVPB (0 mEq Intravenous Stopped 01/28/21 2201)    Mobility walks with person assist

## 2021-01-29 ENCOUNTER — Encounter (HOSPITAL_COMMUNITY): Payer: Self-pay | Admitting: Family Medicine

## 2021-01-29 DIAGNOSIS — E871 Hypo-osmolality and hyponatremia: Secondary | ICD-10-CM

## 2021-01-29 DIAGNOSIS — N1832 Chronic kidney disease, stage 3b: Secondary | ICD-10-CM

## 2021-01-29 DIAGNOSIS — R531 Weakness: Secondary | ICD-10-CM

## 2021-01-29 DIAGNOSIS — F418 Other specified anxiety disorders: Secondary | ICD-10-CM

## 2021-01-29 DIAGNOSIS — I10 Essential (primary) hypertension: Secondary | ICD-10-CM

## 2021-01-29 LAB — CBC
HCT: 21.1 % — ABNORMAL LOW (ref 36.0–46.0)
Hemoglobin: 7.2 g/dL — ABNORMAL LOW (ref 12.0–15.0)
MCH: 24.4 pg — ABNORMAL LOW (ref 26.0–34.0)
MCHC: 34.1 g/dL (ref 30.0–36.0)
MCV: 71.5 fL — ABNORMAL LOW (ref 80.0–100.0)
Platelets: 329 10*3/uL (ref 150–400)
RBC: 2.95 MIL/uL — ABNORMAL LOW (ref 3.87–5.11)
RDW: 15.8 % — ABNORMAL HIGH (ref 11.5–15.5)
WBC: 9.5 10*3/uL (ref 4.0–10.5)
nRBC: 0 % (ref 0.0–0.2)

## 2021-01-29 LAB — COMPREHENSIVE METABOLIC PANEL
ALT: 17 U/L (ref 0–44)
AST: 32 U/L (ref 15–41)
Albumin: 2.4 g/dL — ABNORMAL LOW (ref 3.5–5.0)
Alkaline Phosphatase: 44 U/L (ref 38–126)
Anion gap: 10 (ref 5–15)
BUN: 31 mg/dL — ABNORMAL HIGH (ref 8–23)
CO2: 17 mmol/L — ABNORMAL LOW (ref 22–32)
Calcium: 7.7 mg/dL — ABNORMAL LOW (ref 8.9–10.3)
Chloride: 99 mmol/L (ref 98–111)
Creatinine, Ser: 1.47 mg/dL — ABNORMAL HIGH (ref 0.44–1.00)
GFR, Estimated: 38 mL/min — ABNORMAL LOW (ref >60)
Glucose, Bld: 156 mg/dL — ABNORMAL HIGH (ref 70–99)
Potassium: 3.2 mmol/L — ABNORMAL LOW (ref 3.5–5.1)
Sodium: 126 mmol/L — ABNORMAL LOW (ref 135–145)
Total Bilirubin: 0.5 mg/dL (ref 0.3–1.2)
Total Protein: 6 g/dL — ABNORMAL LOW (ref 6.5–8.1)

## 2021-01-29 LAB — BASIC METABOLIC PANEL
Anion gap: 12 (ref 5–15)
BUN: 35 mg/dL — ABNORMAL HIGH (ref 8–23)
CO2: 21 mmol/L — ABNORMAL LOW (ref 22–32)
Calcium: 8.3 mg/dL — ABNORMAL LOW (ref 8.9–10.3)
Chloride: 94 mmol/L — ABNORMAL LOW (ref 98–111)
Creatinine, Ser: 1.53 mg/dL — ABNORMAL HIGH (ref 0.44–1.00)
GFR, Estimated: 36 mL/min — ABNORMAL LOW (ref >60)
Glucose, Bld: 123 mg/dL — ABNORMAL HIGH (ref 70–99)
Potassium: 2.5 mmol/L — CL (ref 3.5–5.1)
Sodium: 127 mmol/L — ABNORMAL LOW (ref 135–145)

## 2021-01-29 LAB — MAGNESIUM
Magnesium: 1.8 mg/dL (ref 1.7–2.4)
Magnesium: 1.8 mg/dL (ref 1.7–2.4)

## 2021-01-29 LAB — URINALYSIS, ROUTINE W REFLEX MICROSCOPIC
Bilirubin Urine: NEGATIVE
Glucose, UA: NEGATIVE mg/dL
Ketones, ur: NEGATIVE mg/dL
Nitrite: NEGATIVE
Protein, ur: 30 mg/dL — AB
Specific Gravity, Urine: 1.009 (ref 1.005–1.030)
WBC, UA: >50 WBC/hpf — ABNORMAL HIGH (ref 0–5)
pH: 5 (ref 5.0–8.0)

## 2021-01-29 LAB — GLUCOSE, CAPILLARY
Glucose-Capillary: 117 mg/dL — ABNORMAL HIGH (ref 70–99)
Glucose-Capillary: 146 mg/dL — ABNORMAL HIGH (ref 70–99)
Glucose-Capillary: 131 mg/dL — ABNORMAL HIGH (ref 70–99)
Glucose-Capillary: 150 mg/dL — ABNORMAL HIGH (ref 70–99)

## 2021-01-29 LAB — HEMOGLOBIN AND HEMATOCRIT, BLOOD
HCT: 23 % — ABNORMAL LOW (ref 36.0–46.0)
Hemoglobin: 7.8 g/dL — ABNORMAL LOW (ref 12.0–15.0)

## 2021-01-29 LAB — HEMOGLOBIN A1C
Hgb A1c MFr Bld: 6.5 % — ABNORMAL HIGH (ref 4.8–5.6)
Mean Plasma Glucose: 139.85 mg/dL

## 2021-01-29 LAB — LACTIC ACID, PLASMA
Lactic Acid, Venous: 0.8 mmol/L (ref 0.5–1.9)
Lactic Acid, Venous: 1.1 mmol/L (ref 0.5–1.9)

## 2021-01-29 LAB — POTASSIUM: Potassium: 3 mmol/L — ABNORMAL LOW (ref 3.5–5.1)

## 2021-01-29 MED ORDER — SODIUM CHLORIDE 0.9 % IV SOLN
1.0000 g | INTRAVENOUS | Status: DC
  Filled 2021-01-29: qty 10

## 2021-01-29 MED ORDER — CHLORHEXIDINE GLUCONATE CLOTH 2 % EX PADS
6.0000 | MEDICATED_PAD | Freq: Every day | CUTANEOUS | Status: DC
  Administered 2021-01-30 – 2021-02-01 (×3): 6 via TOPICAL

## 2021-01-29 MED ORDER — SODIUM CHLORIDE 0.9 % IV SOLN
250.0000 mg | Freq: Every day | INTRAVENOUS | Status: AC
Start: 2021-01-29 — End: 2021-01-31
  Administered 2021-01-29 – 2021-01-30 (×2): 250 mg via INTRAVENOUS
  Filled 2021-01-29 (×2): qty 20

## 2021-01-29 MED ORDER — FOLIC ACID 1 MG PO TABS
1.0000 mg | ORAL_TABLET | Freq: Every day | ORAL | Status: DC
  Administered 2021-01-30 – 2021-02-01 (×3): 1 mg via ORAL
  Filled 2021-01-29 (×3): qty 1

## 2021-01-29 MED ORDER — POTASSIUM CHLORIDE 10 MEQ/100ML IV SOLN
INTRAVENOUS | Status: AC
  Filled 2021-01-29: qty 100

## 2021-01-29 MED ORDER — VANCOMYCIN HCL 1500 MG/300ML IV SOLN
1500.0000 mg | Freq: Once | INTRAVENOUS | Status: AC
  Administered 2021-01-29: 1500 mg via INTRAVENOUS
  Filled 2021-01-29: qty 300

## 2021-01-29 MED ORDER — HYDRALAZINE HCL 50 MG PO TABS
100.0000 mg | ORAL_TABLET | Freq: Two times a day (BID) | ORAL | Status: DC
  Administered 2021-01-29 (×2): 100 mg via ORAL
  Filled 2021-01-29 (×2): qty 2

## 2021-01-29 MED ORDER — ONDANSETRON HCL 4 MG/2ML IJ SOLN
4.0000 mg | Freq: Four times a day (QID) | INTRAMUSCULAR | Status: DC | PRN
  Administered 2021-01-30 – 2021-02-01 (×2): 4 mg via INTRAVENOUS
  Filled 2021-01-29 (×2): qty 2

## 2021-01-29 MED ORDER — SODIUM CHLORIDE 0.9 % IV BOLUS
500.0000 mL | Freq: Once | INTRAVENOUS | Status: AC
  Administered 2021-01-29: 500 mL via INTRAVENOUS

## 2021-01-29 MED ORDER — POTASSIUM CHLORIDE 20 MEQ PO PACK: 40.0000 meq | PACK | ORAL | Status: DC

## 2021-01-29 MED ORDER — SODIUM CHLORIDE 0.9 % IV SOLN
2.0000 g | INTRAVENOUS | Status: DC
  Administered 2021-01-29 – 2021-01-30 (×2): 2 g via INTRAVENOUS
  Filled 2021-01-29 (×2): qty 2
  Filled 2021-01-29: qty 20

## 2021-01-29 MED ORDER — VANCOMYCIN HCL 750 MG/150ML IV SOLN
750.0000 mg | INTRAVENOUS | Status: DC
  Administered 2021-01-31: 750 mg via INTRAVENOUS
  Filled 2021-01-29: qty 150

## 2021-01-29 MED ORDER — POTASSIUM CHLORIDE IN NACL 40-0.9 MEQ/L-% IV SOLN
INTRAVENOUS | Status: DC
  Filled 2021-01-29 (×4): qty 1000

## 2021-01-29 MED ORDER — POTASSIUM CHLORIDE CRYS ER 10 MEQ PO TBCR
40.0000 meq | EXTENDED_RELEASE_TABLET | ORAL | Status: DC
Start: 2021-01-29 — End: 2021-01-29

## 2021-01-29 MED ORDER — POTASSIUM CHLORIDE 10 MEQ/100ML IV SOLN
10.0000 meq | INTRAVENOUS | Status: AC
  Administered 2021-01-29 (×6): 10 meq via INTRAVENOUS
  Filled 2021-01-29 (×4): qty 100

## 2021-01-29 NOTE — Plan of Care (Signed)
Patient oriented to unit verbalize understanding will continue to monitor

## 2021-01-29 NOTE — Progress Notes (Signed)
Pharmacy Antibiotic Note  Shirley Juarez is a 70 y.o. female admitted on 01/28/2021 with  fatigue and weakness .  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  Pharmacy has been consulted for vancomycin dosing.  UA: moderate leukocytes, negative nitrites, rare bacteria. WBC 9.5, Tm 101.8  Plan: Vancomycin 1500mg  IV x1, followed by Vancomycin 750mg  IV q48 hours (eAUC 431, Scr 1.53, Vd 0.5) Ceftriaxone 2g IV q24 hours (optimized to 2g in case of bacteremia, can likely de-escalate soon) F/u culture data, renal function, clinical improvement Vancomycin levels as indicated  Height: 5' (152.4 cm) Weight: 71 kg (156 lb 8.4 oz) IBW/kg (Calculated) : 45.5  Temp (24hrs), Avg:99.2 F (37.3 C), Min:97.6 F (36.4 C), Max:101.8 F (38.8 C)  Recent Labs  Lab 01/28/21 1555 01/28/21 1851 01/29/21 0549  WBC  --  10.0 9.5  CREATININE 1.47*  --  1.53*    Estimated Creatinine Clearance: 29.7 mL/min (A) (by C-G formula based on SCr of 1.53 mg/dL (H)).    Allergies  Allergen Reactions   Latex Rash   Codeine Nausea And Vomiting   Crestor [Rosuvastatin Calcium] Nausea And Vomiting   Fish Oil Nausea And Vomiting   Hydralazine Other (See Comments) and Cough    100 mg three times a day causes excessive coughing   Lipitor [Atorvastatin Calcium] Nausea And Vomiting   Peanut-Containing Drug Products Hives   Statins Nausea And Vomiting   Welchol [Colesevelam Hcl] Nausea And Vomiting   Zithromax [Azithromycin] Nausea And Vomiting   Other Other (See Comments)    Pet dander    Antimicrobials this admission: Ceftriaxone 8/28 >>  Vancomycin 8/28 >>   Dose adjustments this admission: Ceftriaxone 1g >> 2g in case bacteremia 2/2 UTI  Microbiology results: 8/28 BCx: sent 8/28 UCx: sent   Thank you for allowing pharmacy to be a part of this patient's  care.  9/28, PharmD 01/29/2021 11:57 AM

## 2021-01-29 NOTE — Progress Notes (Signed)
Spoke with Bruna Potter, NP r/t pt RED MEWS.  Orders are for pt to receive another NS bolus.

## 2021-01-29 NOTE — Progress Notes (Signed)
   01/29/21 1942  Vitals  Temp (!) 102.9 F (39.4 C)  Temp Source Oral  BP (!) 162/58  MAP (mmHg) 89  BP Method Automatic  Pulse Rate (!) 103  Resp (!) 24  RED MEWS after being YELLOW for a few hours. On Call TR[facility] was notified, fluid bolus started, tylenol given , ice packs & removal of blankets. Explanation to pt & her husband

## 2021-01-29 NOTE — Progress Notes (Signed)
   01/29/21 0920  Assess: MEWS Score  Temp (!) 100.6 F (38.1 C)  BP (!) 174/70  Pulse Rate 97  Resp (!) 24  SpO2 96 %  O2 Device Room Air  Assess: MEWS Score  MEWS Temp 1  MEWS Systolic 0  MEWS Pulse 0  MEWS RR 1  MEWS LOC 0  MEWS Score 2  MEWS Score Color Yellow  Assess: if the MEWS score is Yellow or Red  Were vital signs taken at a resting state? Yes  Focused Assessment Change from prior assessment (see assessment flowsheet)  Does the patient meet 2 or more of the SIRS criteria? Yes  Does the patient have a confirmed or suspected source of infection? No  Treat  MEWS Interventions Administered prn meds/treatments  Pain Scale 0-10  Pain Score 0  Take Vital Signs  Increase Vital Sign Frequency  Yellow: Q 2hr X 2 then Q 4hr X 2, if remains yellow, continue Q 4hrs  Escalate  MEWS: Escalate Yellow: discuss with charge nurse/RN and consider discussing with provider and RRT  Notify: Charge Nurse/RN  Name of Charge Nurse/RN Notified Toniann Fail RN  Date Charge Nurse/RN Notified 01/29/21  Time Charge Nurse/RN Notified 0945  Notify: Provider  Provider Name/Title Hanley Ben MD  Date Provider Notified 01/29/21  Time Provider Notified [phone]  Notification Type Page  Notification Reason Change in status  Provider response See new orders  Date of Provider Response 01/29/21  Time of Provider Response 0945  Document  Progress note created (see row info) Yes  Assess: SIRS CRITERIA  SIRS Temperature  0  SIRS Pulse 1  SIRS Respirations  1  SIRS WBC 0  SIRS Score Sum  2   Pt in Yellow MEWS d/t increased RR and increased temp. RN noticed pt was shivering and felt hot to the touch. Charge RN aware. MD notified. New orders placed for STAT EKG, STAT Urine culture & urine analysis. RN administered prn tylenol PO to address fever. Yellow MEWS implemented.

## 2021-01-29 NOTE — Progress Notes (Signed)
Pt has not voided since around 1000 this morning. Bladder scan shower 975 mL. MD notified and order placed for in and out cath. Charge RN assisted with in and out cath. In and cath removed 1400 mL from bladder.

## 2021-01-29 NOTE — Progress Notes (Signed)
   01/29/21 1056  Assess: MEWS Score  Temp (!) 101.8 F (38.8 C)  BP (!) 142/59  Pulse Rate (!) 110  Resp (!) 28  Level of Consciousness Alert  SpO2 94 %  O2 Device Room Air  Assess: MEWS Score  MEWS Temp 2  MEWS Systolic 0  MEWS Pulse 1  MEWS RR 2  MEWS LOC 0  MEWS Score 5  MEWS Score Color Red  Assess: if the MEWS score is Yellow or Red  Were vital signs taken at a resting state? Yes  Focused Assessment Change from prior assessment (see assessment flowsheet)  Does the patient meet 2 or more of the SIRS criteria? Yes  Does the patient have a confirmed or suspected source of infection? No  MEWS guidelines implemented *See Row Information* Yes  Treat  MEWS Interventions Administered prn meds/treatments;Administered scheduled meds/treatments  Pain Scale 0-10  Pain Score 0  Take Vital Signs  Increase Vital Sign Frequency  Red: Q 1hr X 4 then Q 4hr X 4, if remains red, continue Q 4hrs  Escalate  MEWS: Escalate Red: discuss with charge nurse/RN and provider, consider discussing with RRT  Notify: Charge Nurse/RN  Name of Charge Nurse/RN Notified Wendy, RN  Date Charge Nurse/RN Notified 01/29/21  Time Charge Nurse/RN Notified 1100  Notify: Provider  Provider Name/Title Hanley Ben, MD  Date Provider Notified 01/29/21  Time Provider Notified 1100  Notification Type Page  Notification Reason Change in status  Provider response See new orders  Date of Provider Response 01/29/21  Time of Provider Response 1100  Document  Patient Outcome Stabilized after interventions  Progress note created (see row info) Yes  Assess: SIRS CRITERIA  SIRS Temperature  1  SIRS Pulse 1  SIRS Respirations  1  SIRS WBC 0  SIRS Score Sum  3   Pt in Red MEWS d/t increased HR, RR, and elevated temp. Charge RN aware. MD notified. New orders placed for lactic acid, blood cultures, prn tylenol suppository, and IV antibiotics. Red MEWS guidelines implemented.

## 2021-01-29 NOTE — Progress Notes (Signed)
PT Cancellation Note  Patient Details Name: Shirley Juarez MRN: [id number] DOB: February 01, 1951   Cancelled Treatment:    Reason Eval/Treat Not Completed: Medical issues which prohibited therapy. Spoke with RN who reports pt is not feeling well, recommending hold PT eval today. Of note, Hgb 7.2. PT to follow up tomorrow as schedule allows.   Ilda Foil 01/29/2021, 10:08 AM  Aida Raider, PT  Office # [phone] Pager [phone]

## 2021-01-29 NOTE — Progress Notes (Signed)
Patient ID: Shirley Juarez, female   DOB: 07-Mar-1951, 70 y.o.   MRN: [id number]  PROGRESS NOTE    Shirley Juarez  QQP:[id number] DOB: 04/24/1951 DOA: 01/28/2021 PCP: Shirlean Mylar, MD   Brief Narrative:  70 y.o. female with medical history significant for HTN, DMT2, depression/anxiety, CAD, OA, recent hospitalization from 01/12/2021-01/13/2021 for acute renal failure/hydronephrosis secondary to urethral stenosis treated with dilation and Foley catheter placement by urology with subsequent removal as an outpatient presented with fatigue and weakness.  On presentation, WBC is up 10,000, hemoglobin of 7.4 (down from 8.5 a few weeks ago), sodium of 123, potassium 2.5, magnesium 1.6.  She was started on IV fluids.  Assessment & Plan:   Hyponatremia -Possibly from poor oral intake and use of Effexor -Sodium 123 on presentation.  127 today.  Switch IV fluids to normal saline with supplemental potassium  Hypokalemia -Potassium 2.5 on presentation.  2.5 this morning as well.  Switch IV fluids to normal saline with supplemental potassium.  We will also give IV potassium runs.  Repeat potassium and antigen level this afternoon  Hypomagnesemia -Improved  Generalized deconditioning and weakness -PT eval  Recent acute renal failure/hydronephrosis secondary to urethral stenosis treated with dilation and Foley catheter placement by urology with subsequent removal as an outpatient -Follow with urology as an outpatient -Patient had fever this morning.  Will check UA and urine culture along with blood cultures.  Diabetes mellitus type 2 -DC metformin.  CBGs with SSI.  Microcytic anemia -Iron studies consistent with iron deficiency anemia.  Hemoglobin 7.2 this morning.  We will give IV iron -Transfuse packed red cells if hemoglobin is less than 7  CKD stage IIIb -Creatinine stable.  Monitor   prolonged QT -Repeat EKG today.  Avoid QT prolonging medications  Depression with anxiety -Effexor on hold due to  hyponatremia  DVT prophylaxis: Heparin Code Status: Full Family Communication: None at bedside Disposition Plan: Status is: Inpatient  Remains inpatient appropriate because:Inpatient level of care appropriate due to severity of illness  Dispo: The patient is from: Home              Anticipated d/c is to: Home              Patient currently is not medically stable to d/c.   Difficult to place patient No  Consultants: None  Procedures: None  Antimicrobials: None   Subjective: Patient seen and examined at bedside.  Feels very weak and intermittently nauseous.  Had fever this morning.  Denies worsening shortness of breath, chest pain.  Objective: Vitals:   01/29/21 0300 01/29/21 0359 01/29/21 0813 01/29/21 0920  BP: (!) 143/65  (!) 182/68 (!) 174/70  Pulse: 94  93 97  Resp: 20  (!) 24 (!) 24  Temp: (!) 100.6 F (38.1 C) 98 F (36.7 C) 98 F (36.7 C) (!) 100.6 F (38.1 C)  TempSrc: Oral Oral  Oral  SpO2: 95%  98% 96%  Weight:      Height:        Intake/Output Summary (Last 24 hours) at 01/29/2021 1041 Last data filed at 01/29/2021 0300 Gross per 24 hour  Intake 1428.61 ml  Output --  Net 1428.61 ml   Filed Weights   01/29/21 0033  Weight: 71 kg    Examination:  General exam: Appears calm and comfortable.  Looks chronically ill and deconditioned.  Currently on room air. Respiratory system: Bilateral decreased breath sounds at bases with some scattered crackles Cardiovascular system: S1 &  S2 heard, Rate controlled Gastrointestinal system: Abdomen is nondistended, soft and nontender. Normal bowel sounds heard. Extremities: No cyanosis, clubbing, edema  Central nervous system: Alert and oriented.  Extreme slow to respond.  Poor historian.  No focal neurological deficits. Moving extremities Skin: No rashes, lesions or ulcers Psychiatry: Affect is mostly flat.  Intermittently looks anxious.   Data Reviewed: I have personally reviewed following labs and imaging  studies  CBC: Recent Labs  Lab 01/28/21 1851 01/29/21 0549  WBC 10.0 9.5  HGB 7.4* 7.2*  HCT 22.5* 21.1*  MCV 72.3* 71.5*  PLT 344 329   Basic Metabolic Panel: Recent Labs  Lab 01/28/21 1555 01/28/21 1851 01/29/21 0549  NA 123*  --  127*  K 2.5* 2.7* 2.5*  CL 86*  --  94*  CO2 23  --  21*  GLUCOSE 175*  --  123*  BUN 29*  --  35*  CREATININE 1.47*  --  1.53*  CALCIUM 8.4*  --  8.3*  MG 1.6*  --  1.8   GFR: Estimated Creatinine Clearance: 29.7 mL/min (A) (by C-G formula based on SCr of 1.53 mg/dL (H)). Liver Function Tests: Recent Labs  Lab 01/28/21 1555  AST 27  ALT 17  ALKPHOS 63  BILITOT 0.6  PROT 6.9  ALBUMIN 3.1*   Recent Labs  Lab 01/28/21 1555  LIPASE 25   No results for input(s): AMMONIA in the last 168 hours. Coagulation Profile: No results for input(s): INR, PROTIME in the last 168 hours. Cardiac Enzymes: No results for input(s): CKTOTAL, CKMB, CKMBINDEX, TROPONINI in the last 168 hours. BNP (last 3 results) No results for input(s): PROBNP in the last 8760 hours. HbA1C: Recent Labs    01/29/21 0549  HGBA1C 6.5*   CBG: Recent Labs  Lab 01/28/21 2259 01/29/21 0750  GLUCAP 135* 117*   Lipid Profile: No results for input(s): CHOL, HDL, LDLCALC, TRIG, CHOLHDL, LDLDIRECT in the last 72 hours. Thyroid Function Tests: Recent Labs    01/28/21 1555  TSH 0.735   Anemia Panel: Recent Labs    01/28/21 2207  FERRITIN 134  TIBC 349  IRON 11*   Sepsis Labs: No results for input(s): PROCALCITON, LATICACIDVEN in the last 168 hours.  Recent Results (from the past 240 hour(s))  Resp Panel by RT-PCR (Flu A&B, Covid) Nasopharyngeal Swab     Status: None   Collection Time: 01/28/21  3:55 PM   Specimen: Nasopharyngeal Swab; Nasopharyngeal(NP) swabs in vial transport medium  Result Value Ref Range Status   SARS Coronavirus 2 by RT PCR NEGATIVE NEGATIVE Final    Comment: (NOTE) SARS-CoV-2 target nucleic acids are NOT DETECTED.  The  SARS-CoV-2 RNA is generally detectable in upper respiratory specimens during the acute phase of infection. The lowest concentration of SARS-CoV-2 viral copies this assay can detect is 138 copies/mL. A negative result does not preclude SARS-Cov-2 infection and should not be used as the sole basis for treatment or other patient management decisions. A negative result may occur with  improper specimen collection/handling, submission of specimen other than nasopharyngeal swab, presence of viral mutation(s) within the areas targeted by this assay, and inadequate number of viral copies(<138 copies/mL). A negative result must be combined with clinical observations, patient history, and epidemiological information. The expected result is Negative.  Fact Sheet for Patients:  BloggerCourse.com  Fact Sheet for Healthcare Providers:  SeriousBroker.it  This test is no t yet approved or cleared by the [place] and  has been authorized for  detection and/or diagnosis of SARS-CoV-2 by FDA under an Emergency Use Authorization (EUA). This EUA will remain  in effect (meaning this test can be used) for the duration of the COVID-19 declaration under Section 564(b)(1) of the Act, 21 U.S.C.section 360bbb-3(b)(1), unless the authorization is terminated  or revoked sooner.       Influenza A by PCR NEGATIVE NEGATIVE Final   Influenza B by PCR NEGATIVE NEGATIVE Final    Comment: (NOTE) The Xpert Xpress SARS-CoV-2/FLU/RSV plus assay is intended as an aid in the diagnosis of influenza from Nasopharyngeal swab specimens and should not be used as a sole basis for treatment. Nasal washings and aspirates are unacceptable for Xpert Xpress SARS-CoV-2/FLU/RSV testing.  Fact Sheet for Patients: BloggerCourse.com  Fact Sheet for Healthcare Providers: SeriousBroker.it  This test is not yet approved or  cleared by the [place] FDA and has been authorized for detection and/or diagnosis of SARS-CoV-2 by FDA under an Emergency Use Authorization (EUA). This EUA will remain in effect (meaning this test can be used) for the duration of the COVID-19 declaration under Section 564(b)(1) of the Act, 21 U.S.C. section 360bbb-3(b)(1), unless the authorization is terminated or revoked.  Performed at [facility], [address], [place], [place] [address]          Radiology Studies: DG Chest 2 View  Result Date: 01/28/2021 CLINICAL DATA:  Weakness.  Generalized fatigue and nausea. EXAM: CHEST - 2 VIEW COMPARISON:  February 05, 2016 FINDINGS: The heart size and mediastinal contours are within normal limits. Both lungs are clear. The visualized skeletal structures are unremarkable. IMPRESSION: No active cardiopulmonary disease. Electronically Signed   By: Gerome Sam III M.D.   On: 01/28/2021 16:17   CT HEAD WO CONTRAST ( )  Result Date: 01/28/2021 CLINICAL DATA:  Delirium, weakness, and fatigue. EXAM: CT HEAD WITHOUT CONTRAST TECHNIQUE: Contiguous axial images were obtained from the base of the skull through the vertex without intravenous contrast. COMPARISON:  CT head dated February 06, 2016. FINDINGS: Brain: No evidence of acute infarction, hemorrhage, hydrocephalus, extra-axial collection or mass lesion/mass effect. Stable mild chronic microvascular ischemic changes. Vascular: Calcified atherosclerosis at the skullbase. No hyperdense vessel. Skull: Normal. Negative for fracture or focal lesion. Sinuses/Orbits: No acute finding. Other: None. IMPRESSION: 1. No acute intracranial abnormality. Electronically Signed   By: Obie Dredge M.D.   On: 01/28/2021 16:48        Scheduled Meds:  ALPRAZolam  0.5 mg Oral BID   amLODipine  5 mg Oral Daily   heparin  5,000 Units Subcutaneous Q8H   hydrALAZINE  100 mg Oral BID   hydrOXYzine  25 mg Oral QHS   insulin aspart  0-5  Units Subcutaneous QHS   insulin aspart  0-9 Units Subcutaneous TID WC   pantoprazole  40 mg Oral Daily   pantoprazole (PROTONIX) IV  40 mg Intravenous Once   Continuous Infusions:  0.9 % NaCl with KCl 40 mEq / L 100 mL/hr at 01/29/21 0944   potassium chloride 10 mEq (01/29/21 0949)          Glade Lloyd, MD [facility] 01/29/2021, 10:41 AM

## 2021-01-30 ENCOUNTER — Inpatient Hospital Stay (HOSPITAL_COMMUNITY): Payer: Medicare Other

## 2021-01-30 DIAGNOSIS — D509 Iron deficiency anemia, unspecified: Secondary | ICD-10-CM

## 2021-01-30 DIAGNOSIS — R9431 Abnormal electrocardiogram [ECG] [EKG]: Secondary | ICD-10-CM

## 2021-01-30 DIAGNOSIS — R338 Other retention of urine: Secondary | ICD-10-CM

## 2021-01-30 DIAGNOSIS — N390 Urinary tract infection, site not specified: Secondary | ICD-10-CM

## 2021-01-30 LAB — GLUCOSE, CAPILLARY
Glucose-Capillary: 145 mg/dL — ABNORMAL HIGH (ref 70–99)
Glucose-Capillary: 150 mg/dL — ABNORMAL HIGH (ref 70–99)
Glucose-Capillary: 142 mg/dL — ABNORMAL HIGH (ref 70–99)

## 2021-01-30 LAB — CBC WITH DIFFERENTIAL/PLATELET
Abs Immature Granulocytes: 0.11 10*3/uL — ABNORMAL HIGH (ref 0.00–0.07)
Basophils Absolute: 0 10*3/uL (ref 0.0–0.1)
Basophils Relative: 0 %
Eosinophils Absolute: 0 10*3/uL (ref 0.0–0.5)
Eosinophils Relative: 0 %
HCT: 20.9 % — ABNORMAL LOW (ref 36.0–46.0)
Hemoglobin: 6.9 g/dL — CL (ref 12.0–15.0)
Immature Granulocytes: 1 %
Lymphocytes Relative: 5 %
Lymphs Abs: 0.5 10*3/uL — ABNORMAL LOW (ref 0.7–4.0)
MCH: 23.8 pg — ABNORMAL LOW (ref 26.0–34.0)
MCHC: 33 g/dL (ref 30.0–36.0)
MCV: 72.1 fL — ABNORMAL LOW (ref 80.0–100.0)
Monocytes Absolute: 0.6 10*3/uL (ref 0.1–1.0)
Monocytes Relative: 7 %
Neutro Abs: 8.4 10*3/uL — ABNORMAL HIGH (ref 1.7–7.7)
Neutrophils Relative %: 87 %
Platelets: 353 10*3/uL (ref 150–400)
RBC: 2.9 MIL/uL — ABNORMAL LOW (ref 3.87–5.11)
RDW: 16.7 % — ABNORMAL HIGH (ref 11.5–15.5)
WBC: 9.7 10*3/uL (ref 4.0–10.5)
nRBC: 0 % (ref 0.0–0.2)

## 2021-01-30 LAB — COMPREHENSIVE METABOLIC PANEL
ALT: 17 U/L (ref 0–44)
AST: 31 U/L (ref 15–41)
Albumin: 2.5 g/dL — ABNORMAL LOW (ref 3.5–5.0)
Alkaline Phosphatase: 51 U/L (ref 38–126)
Anion gap: 9 (ref 5–15)
BUN: 24 mg/dL — ABNORMAL HIGH (ref 8–23)
CO2: 19 mmol/L — ABNORMAL LOW (ref 22–32)
Calcium: 7.9 mg/dL — ABNORMAL LOW (ref 8.9–10.3)
Chloride: 97 mmol/L — ABNORMAL LOW (ref 98–111)
Creatinine, Ser: 1.36 mg/dL — ABNORMAL HIGH (ref 0.44–1.00)
GFR, Estimated: 42 mL/min — ABNORMAL LOW (ref >60)
Glucose, Bld: 154 mg/dL — ABNORMAL HIGH (ref 70–99)
Potassium: 3 mmol/L — ABNORMAL LOW (ref 3.5–5.1)
Sodium: 125 mmol/L — ABNORMAL LOW (ref 135–145)
Total Bilirubin: 0.3 mg/dL (ref 0.3–1.2)
Total Protein: 6.2 g/dL — ABNORMAL LOW (ref 6.5–8.1)

## 2021-01-30 LAB — HEMOGLOBIN AND HEMATOCRIT, BLOOD
HCT: 26 % — ABNORMAL LOW (ref 36.0–46.0)
Hemoglobin: 8.4 g/dL — ABNORMAL LOW (ref 12.0–15.0)

## 2021-01-30 LAB — VITAMIN B12: Vitamin B-12: 515 pg/mL (ref 180–914)

## 2021-01-30 LAB — MAGNESIUM: Magnesium: 1.4 mg/dL — ABNORMAL LOW (ref 1.7–2.4)

## 2021-01-30 LAB — PREPARE RBC (CROSSMATCH)

## 2021-01-30 LAB — TSH: TSH: 0.629 u[IU]/mL (ref 0.350–4.500)

## 2021-01-30 MED ORDER — ADULT MULTIVITAMIN W/MINERALS CH
1.0000 | ORAL_TABLET | Freq: Every day | ORAL | Status: DC
  Administered 2021-01-30 – 2021-02-01 (×3): 1 via ORAL
  Filled 2021-01-30 (×3): qty 1

## 2021-01-30 MED ORDER — ENSURE ENLIVE PO LIQD
237.0000 mL | Freq: Two times a day (BID) | ORAL | Status: DC
  Administered 2021-01-30 – 2021-02-01 (×3): 237 mL via ORAL

## 2021-01-30 MED ORDER — TAMSULOSIN HCL 0.4 MG PO CAPS
0.4000 mg | ORAL_CAPSULE | Freq: Every day | ORAL | Status: DC
  Administered 2021-01-30 – 2021-02-01 (×3): 0.4 mg via ORAL
  Filled 2021-01-30 (×3): qty 1

## 2021-01-30 MED ORDER — ACETAMINOPHEN 500 MG PO TABS
1000.0000 mg | ORAL_TABLET | Freq: Once | ORAL | Status: AC
  Administered 2021-01-30: 1000 mg via ORAL
  Filled 2021-01-30: qty 2

## 2021-01-30 MED ORDER — MAGNESIUM SULFATE 2 GM/50ML IV SOLN
2.0000 g | Freq: Once | INTRAVENOUS | Status: AC
  Administered 2021-01-30: 2 g via INTRAVENOUS
  Filled 2021-01-30: qty 50

## 2021-01-30 MED ORDER — IOHEXOL 9 MG/ML PO SOLN
ORAL | Status: AC
  Administered 2021-01-30: 500 mL
  Filled 2021-01-30: qty 1000

## 2021-01-30 MED ORDER — SODIUM CHLORIDE 0.9% IV SOLUTION: Freq: Once | INTRAVENOUS | Status: AC

## 2021-01-30 MED ORDER — POTASSIUM CHLORIDE 10 MEQ/100ML IV SOLN
10.0000 meq | INTRAVENOUS | Status: AC
  Administered 2021-01-30 (×6): 10 meq via INTRAVENOUS
  Filled 2021-01-30 (×6): qty 100

## 2021-01-30 MED ORDER — POTASSIUM CHLORIDE 10 MEQ/100ML IV SOLN
INTRAVENOUS | Status: AC
  Filled 2021-01-30: qty 100

## 2021-01-30 NOTE — Progress Notes (Signed)
PT Cancellation Note  Patient Details Name: Shirley Juarez MRN: [id number] DOB: 09-Oct-1950   Cancelled Treatment:    Reason Eval/Treat Not Completed: Medical issues which prohibited therapy--hgb 6.9. Will hold PT today and check back on tomorrow.    Faye Ramsay, PT Acute Rehabilitation  Office: [phone] Pager: [phone]

## 2021-01-30 NOTE — Progress Notes (Signed)
Initial Nutrition Assessment  DOCUMENTATION CODES:  Not applicable  INTERVENTION:  Add Ensure Plus po BID, each supplement provides 350 kcal and 13 grams of protein.   Add MVI with minerals daily.  NUTRITION DIAGNOSIS:  Increased nutrient needs related to acute illness (UTI) as evidenced by estimated needs.  GOAL:  Patient will meet greater than or equal to 90% of their needs  MONITOR:  PO intake, Supplement acceptance, Labs, Weight trends, I & O's  REASON FOR ASSESSMENT:  Malnutrition Screening Tool    ASSESSMENT:  70 yo female with a PMH of HTN, DMT2, depression/anxiety, CAD, OA, recent hospitalization from 01/12/2021-01/13/2021 for acute renal failure/hydronephrosis secondary to urethral stenosis treated with dilation and Foley catheter placement by urology with subsequent removal as an outpatient presented with fatigue and weakness. Admitted with hypokalemia and possible UTI.  RD working remotely. Attempted to speak to pt by calling room phone. Pt did not answer.  Pt with 0-20% at meals last night. This morning, pt ate 80% of breakfast per Epic.  Per Epic, pt has lost ~6 lbs (3.7%) in the last 2.5 weeks, which is significant and severe for the time frame.  Recommend Ensure Plus BID and MVI with minerals daily.  Medications: reviewed; folic acid, SSI, Protonix, NaCl with K-Cl 40 mEq @ 100 ml/hr, K-Cl 10 mEq via IV, Zofran PRN (given once today)  Labs: reviewed; Na 125 (L), K 3 (L), CBG 117-150 (H), BUN 24 (H), Crt 1.36 (H), Mag 1.4 (L) HbA1c: 6.5% (01/29/2021)  NUTRITION - FOCUSED PHYSICAL EXAM: Unable to perform - defer to in-person assessment  Diet Order:   Diet Order             Diet heart healthy/carb modified Room service appropriate? Yes; Fluid consistency: Thin  Diet effective now                  EDUCATION NEEDS:  No education needs have been identified at this time  Skin:  Skin Assessment: Reviewed RN Assessment  Last BM:  01/29/21 - Type 1,  small  Height:  Ht Readings from Last 1 Encounters:  01/29/21 5' (1.524 m)   Weight:  Wt Readings from Last 1 Encounters:  01/30/21 68.4 kg   BMI:  Body mass index is 29.45 kg/m.  Estimated Nutritional Needs:  Kcal:  1850-2050 Protein:  80-95 grams Fluid:  >1.85 L  Vertell Limber, RD, LDN (she/her/hers) Registered Dietitian I After-Hours/Weekend Pager # [phone]

## 2021-01-30 NOTE — Progress Notes (Signed)
BP 177/69, patient denies any complaints at time, no distress.  Dr. Hanley Ben made aware of BP, with plan of restarting BP medication in the morning.  Patient's needs addressed.

## 2021-01-30 NOTE — Progress Notes (Signed)
Patient ID: Shirley Juarez, female   DOB: 01/29/1951, 70 y.o.   MRN: [id number]  PROGRESS NOTE    Shirley Juarez  FWY:[id number] DOB: 1950/06/25 DOA: 01/28/2021 PCP: Shirlean Mylar, MD   Brief Narrative:  70 y.o. female with medical history significant for HTN, DMT2, depression/anxiety, CAD, OA, recent hospitalization from 01/12/2021-01/13/2021 for acute renal failure/hydronephrosis secondary to urethral stenosis treated with dilation and Foley catheter placement by urology with subsequent removal as an outpatient presented with fatigue and weakness.  On presentation, WBC is up 10,000, hemoglobin of 7.4 (down from 8.5 a few weeks ago), sodium of 123, potassium 2.5, magnesium 1.6.  She was started on IV fluids.  Assessment & Plan:   UTI: Possibly present on admission Recent acute renal failure/hydronephrosis secondary to urethral stenosis treated with dilation and Foley catheter placement by urology with subsequent removal as an outpatient Urinary retention -Spiking temperatures.  Started on broad-spectrum antibiotics on 01/21/2021.  Had urinary retention overnight on 01/21/2021 for which Foley catheter was placed. -No leukocytosis.  Blood and urine cultures negative so far -We will get CT of the chest abdomen and pelvis without IV contrast because of renal function  Hyponatremia -Possibly from poor oral intake and use of Effexor -Sodium 123 on presentation.  125 today.  Continue IV fluids.  Repeat a.m. labs. -We will add salt tablets as well.  Hypokalemia -Potassium 2.5 on presentation.  Potassium 3 this morning.   -Continue supplement.  Repeat a.m. labs.  Hypomagnesemia -Replace.  Repeat a.m. labs  Generalized deconditioning and weakness -PT eval  Diabetes mellitus type 2 -Off metformin.  CBGs with SSI.  Acute on chronic microcytic anemia -Iron studies consistent with iron deficiency anemia.  Hemoglobin 6.9 this morning.   -Patient was given IV iron on 01/29/2021 -Transfuse 1 unit packed red  cells today.  Monitor.  Will need oral iron on discharge.  CKD stage IIIb -Creatinine stable.  Monitor   prolonged QT -Repeat EKG showed improved QT on 01/21/2021  Depression with anxiety -Effexor on hold due to hyponatremia -DC scheduled Xanax and as needed hydroxyzine because of lethargy  DVT prophylaxis: Start SCDs.  DC heparin because of anemia  code Status: Full Family Communication: Daughter at bedside  disposition Plan: Status is: Inpatient  Remains inpatient appropriate because:Inpatient level of care appropriate due to severity of illness  Dispo: The patient is from: Home              Anticipated d/c is to: Home              Patient currently is not medically stable to d/c.   Difficult to place patient No  Consultants: None  Procedures: None  Antimicrobials: Rocephin and vancomycin from 01/21/2021 onwards   Subjective: Patient seen and examined at bedside.  Still slow to respond but daughter states that she looks more awake this morning and feels slightly hungry.  Still having fevers.  Denies current nausea, chest pain, worsening shortness of breath.  Foley catheter was inserted last night as per nursing staff.  Objective: Vitals:   01/30/21 0244 01/30/21 0444 01/30/21 0448 01/30/21 0700  BP: (!) 159/60   (!) 126/58  Pulse: 100   67  Resp: 20   16  Temp: (!) 102.8 F (39.3 C) 99.5 F (37.5 C)  (!) 97.3 F (36.3 C)  TempSrc: Rectal Oral  Oral  SpO2: 97%   98%  Weight:   68.4 kg   Height:        Intake/Output  Summary (Last 24 hours) at 01/30/2021 0918 Last data filed at 01/30/2021 0834 Gross per 24 hour  Intake 1960 ml  Output 3075 ml  Net -1115 ml    Filed Weights   01/29/21 0033 01/30/21 0448  Weight: 71 kg 68.4 kg    Examination:  General exam: On room air currently.  No distress.  Looks chronically ill and deconditioned.   Respiratory system: Decreased breath sounds at bases bilaterally with some crackles  cardiovascular system: Rate  controlled, S1-S2 heard gastrointestinal system: Abdomen is distended slightly, soft and nontender.  Bowel sounds are heard Extremities: No edema or clubbing  Central nervous system: Awake, still slow to respond but answers some questions; poor historian.  No focal neurological deficits.  Moves extremities  skin: No obvious ecchymosis/rashes  psychiatry: Flat affect  Data Reviewed: I have personally reviewed following labs and imaging studies  CBC: Recent Labs  Lab 01/28/21 1851 01/29/21 0549 01/29/21 1357 01/30/21 0455  WBC 10.0 9.5  --  9.7  NEUTROABS  --   --   --  8.4*  HGB 7.4* 7.2* 7.8* 6.9*  HCT 22.5* 21.1* 23.0* 20.9*  MCV 72.3* 71.5*  --  72.1*  PLT 344 329  --  353    Basic Metabolic Panel: Recent Labs  Lab 01/28/21 1555 01/28/21 1851 01/29/21 0549 01/29/21 1357 01/29/21 2155 01/30/21 0455  NA 123*  --  127*  --  126* 125*  K 2.5* 2.7* 2.5* 3.0* 3.2* 3.0*  CL 86*  --  94*  --  99 97*  CO2 23  --  21*  --  17* 19*  GLUCOSE 175*  --  123*  --  156* 154*  BUN 29*  --  35*  --  31* 24*  CREATININE 1.47*  --  1.53*  --  1.47* 1.36*  CALCIUM 8.4*  --  8.3*  --  7.7* 7.9*  MG 1.6*  --  1.8 1.8  --  1.4*    GFR: Estimated Creatinine Clearance: 32.8 mL/min (A) (by C-G formula based on SCr of 1.36 mg/dL (H)). Liver Function Tests: Recent Labs  Lab 01/28/21 1555 01/29/21 2155 01/30/21 0455  AST 27 32 31  ALT 17 17 17   ALKPHOS 63 44 51  BILITOT 0.6 0.5 0.3  PROT 6.9 6.0* 6.2*  ALBUMIN 3.1* 2.4* 2.5*    Recent Labs  Lab 01/28/21 1555  LIPASE 25    No results for input(s): AMMONIA in the last 168 hours. Coagulation Profile: No results for input(s): INR, PROTIME in the last 168 hours. Cardiac Enzymes: No results for input(s): CKTOTAL, CKMB, CKMBINDEX, TROPONINI in the last 168 hours. BNP (last 3 results) No results for input(s): PROBNP in the last 8760 hours. HbA1C: Recent Labs    01/29/21 0549  HGBA1C 6.5*    CBG: Recent Labs  Lab  01/28/21 2259 01/29/21 0750 01/29/21 1240 01/29/21 1656 01/29/21 2012  GLUCAP 135* 117* 146* 131* 150*    Lipid Profile: No results for input(s): CHOL, HDL, LDLCALC, TRIG, CHOLHDL, LDLDIRECT in the last 72 hours. Thyroid Function Tests: Recent Labs    01/30/21 0455  TSH 0.629    Anemia Panel: Recent Labs    01/28/21 2207 01/30/21 0455  VITAMINB12  --  515  FERRITIN 134  --   TIBC 349  --   IRON 11*  --     Sepsis Labs: Recent Labs  Lab 01/29/21 1121 01/29/21 1357  LATICACIDVEN 0.8 1.1    Recent Results (from the  past 240 hour(s))  Resp Panel by RT-PCR (Flu A&B, Covid) Nasopharyngeal Swab     Status: None   Collection Time: 01/28/21  3:55 PM   Specimen: Nasopharyngeal Swab; Nasopharyngeal(NP) swabs in vial transport medium  Result Value Ref Range Status   SARS Coronavirus 2 by RT PCR NEGATIVE NEGATIVE Final    Comment: (NOTE) SARS-CoV-2 target nucleic acids are NOT DETECTED.  The SARS-CoV-2 RNA is generally detectable in upper respiratory specimens during the acute phase of infection. The lowest concentration of SARS-CoV-2 viral copies this assay can detect is 138 copies/mL. A negative result does not preclude SARS-Cov-2 infection and should not be used as the sole basis for treatment or other patient management decisions. A negative result may occur with  improper specimen collection/handling, submission of specimen other than nasopharyngeal swab, presence of viral mutation(s) within the areas targeted by this assay, and inadequate number of viral copies(<138 copies/mL). A negative result must be combined with clinical observations, patient history, and epidemiological information. The expected result is Negative.  Fact Sheet for Patients:  BloggerCourse.com  Fact Sheet for Healthcare Providers:  SeriousBroker.it  This test is no t yet approved or cleared by the [place] FDA and  has been  authorized for detection and/or diagnosis of SARS-CoV-2 by FDA under an Emergency Use Authorization (EUA). This EUA will remain  in effect (meaning this test can be used) for the duration of the COVID-19 declaration under Section 564(b)(1) of the Act, 21 U.S.C.section 360bbb-3(b)(1), unless the authorization is terminated  or revoked sooner.       Influenza A by PCR NEGATIVE NEGATIVE Final   Influenza B by PCR NEGATIVE NEGATIVE Final    Comment: (NOTE) The Xpert Xpress SARS-CoV-2/FLU/RSV plus assay is intended as an aid in the diagnosis of influenza from Nasopharyngeal swab specimens and should not be used as a sole basis for treatment. Nasal washings and aspirates are unacceptable for Xpert Xpress SARS-CoV-2/FLU/RSV testing.  Fact Sheet for Patients: BloggerCourse.com  Fact Sheet for Healthcare Providers: SeriousBroker.it  This test is not yet approved or cleared by the [place] FDA and has been authorized for detection and/or diagnosis of SARS-CoV-2 by FDA under an Emergency Use Authorization (EUA). This EUA will remain in effect (meaning this test can be used) for the duration of the COVID-19 declaration under Section 564(b)(1) of the Act, 21 U.S.C. section 360bbb-3(b)(1), unless the authorization is terminated or revoked.  Performed at [facility], [address] [place], [place] [address]   Culture, blood (routine x 2)     Status: None (Preliminary result)   Collection Time: 01/29/21 10:48 AM   Specimen: BLOOD  Result Value Ref Range Status   Specimen Description   Final    BLOOD LEFT ANTECUBITAL Performed at [facility], [address] [place], [place] [address]    Special Requests   Final    BOTTLES DRAWN AEROBIC AND ANAEROBIC Blood Culture adequate volume Performed at [facility], [address], [place], [place] [address]    Culture   Final    NO  GROWTH < 24 HOURS Performed at [facility], [address] [place], [place] [address]    Report Status PENDING  Incomplete  Culture, blood (routine x 2)     Status: None (Preliminary result)   Collection Time: 01/29/21 10:49 AM   Specimen: BLOOD  Result Value Ref Range Status   Specimen Description   Final    BLOOD BLOOD RIGHT  HAND Performed at [facility], [address] [place], [place] [address]    Special Requests   Final    BOTTLES DRAWN AEROBIC ONLY Blood Culture adequate volume Performed at [facility], [address] [place], [place] [address]    Culture   Final    NO GROWTH < 24 HOURS Performed at [facility], [address], [place], [place] [address]    Report Status PENDING  Incomplete          Radiology Studies: DG Chest 2 View  Result Date: 01/28/2021 CLINICAL DATA:  Weakness.  Generalized fatigue and nausea. EXAM: CHEST - 2 VIEW COMPARISON:  February 05, 2016 FINDINGS: The heart size and mediastinal contours are within normal limits. Both lungs are clear. The visualized skeletal structures are unremarkable. IMPRESSION: No active cardiopulmonary disease. Electronically Signed   By: Gerome Sam III M.D.   On: 01/28/2021 16:17   CT HEAD WO CONTRAST ( )  Result Date: 01/28/2021 CLINICAL DATA:  Delirium, weakness, and fatigue. EXAM: CT HEAD WITHOUT CONTRAST TECHNIQUE: Contiguous axial images were obtained from the base of the skull through the vertex without intravenous contrast. COMPARISON:  CT head dated February 06, 2016. FINDINGS: Brain: No evidence of acute infarction, hemorrhage, hydrocephalus, extra-axial collection or mass lesion/mass effect. Stable mild chronic microvascular ischemic changes. Vascular: Calcified atherosclerosis at the skullbase. No hyperdense vessel. Skull: Normal. Negative for fracture or focal lesion. Sinuses/Orbits: No acute finding. Other: None. IMPRESSION: 1. No acute  intracranial abnormality. Electronically Signed   By: Obie Dredge M.D.   On: 01/28/2021 16:48        Scheduled Meds:  sodium chloride   Intravenous Once   Chlorhexidine Gluconate Cloth  6 each Topical Daily   folic acid  1 mg Oral Daily   heparin  5,000 Units Subcutaneous Q8H   insulin aspart  0-5 Units Subcutaneous QHS   insulin aspart  0-9 Units Subcutaneous TID WC   pantoprazole  40 mg Oral Daily   Continuous Infusions:  0.9 % NaCl with KCl 40 mEq / L 100 mL/hr at 01/29/21 0944   cefTRIAXone (ROCEPHIN)  IV 2 g (01/29/21 1437)   ferric gluconate (FERRLECIT) IVPB 250 mg (01/29/21 1651)   magnesium sulfate bolus IVPB 2 g (01/30/21 0834)   potassium chloride 10 mEq (01/30/21 0828)   [START ON 01/31/2021] vancomycin            Glade Lloyd, MD [facility] 01/30/2021, 9:18 AM

## 2021-01-30 NOTE — Plan of Care (Signed)
  Problem: Education: Goal: Knowledge of General Education information will improve Description Including pain rating scale, medication(s)/side effects and non-pharmacologic comfort measures Outcome: Progressing   

## 2021-01-31 LAB — CBC WITH DIFFERENTIAL/PLATELET
Abs Immature Granulocytes: 0.16 10*3/uL — ABNORMAL HIGH (ref 0.00–0.07)
Basophils Absolute: 0 10*3/uL (ref 0.0–0.1)
Basophils Relative: 0 %
Eosinophils Absolute: 0 10*3/uL (ref 0.0–0.5)
Eosinophils Relative: 0 %
HCT: 23.6 % — ABNORMAL LOW (ref 36.0–46.0)
Hemoglobin: 8.1 g/dL — ABNORMAL LOW (ref 12.0–15.0)
Immature Granulocytes: 2 %
Lymphocytes Relative: 8 %
Lymphs Abs: 0.9 10*3/uL (ref 0.7–4.0)
MCH: 24.9 pg — ABNORMAL LOW (ref 26.0–34.0)
MCHC: 34.3 g/dL (ref 30.0–36.0)
MCV: 72.6 fL — ABNORMAL LOW (ref 80.0–100.0)
Monocytes Absolute: 0.7 10*3/uL (ref 0.1–1.0)
Monocytes Relative: 6 %
Neutro Abs: 8.8 10*3/uL — ABNORMAL HIGH (ref 1.7–7.7)
Neutrophils Relative %: 84 %
Platelets: 391 10*3/uL (ref 150–400)
RBC: 3.25 MIL/uL — ABNORMAL LOW (ref 3.87–5.11)
RDW: 18.1 % — ABNORMAL HIGH (ref 11.5–15.5)
WBC: 10.4 10*3/uL (ref 4.0–10.5)
nRBC: 0 % (ref 0.0–0.2)

## 2021-01-31 LAB — TYPE AND SCREEN
ABO/RH(D): O NEG
Antibody Screen: NEGATIVE
Unit division: 0

## 2021-01-31 LAB — BASIC METABOLIC PANEL
Anion gap: 11 (ref 5–15)
BUN: 15 mg/dL (ref 8–23)
CO2: 18 mmol/L — ABNORMAL LOW (ref 22–32)
Calcium: 8.2 mg/dL — ABNORMAL LOW (ref 8.9–10.3)
Chloride: 100 mmol/L (ref 98–111)
Creatinine, Ser: 0.91 mg/dL (ref 0.44–1.00)
GFR, Estimated: >60 mL/min (ref >60)
Glucose, Bld: 124 mg/dL — ABNORMAL HIGH (ref 70–99)
Potassium: 3.2 mmol/L — ABNORMAL LOW (ref 3.5–5.1)
Sodium: 129 mmol/L — ABNORMAL LOW (ref 135–145)

## 2021-01-31 LAB — BPAM RBC
Blood Product Expiration Date: 202209132359
ISSUE DATE / TIME: 202208291201
Unit Type and Rh: 9500

## 2021-01-31 LAB — GLUCOSE, CAPILLARY
Glucose-Capillary: 125 mg/dL — ABNORMAL HIGH (ref 70–99)
Glucose-Capillary: 117 mg/dL — ABNORMAL HIGH (ref 70–99)
Glucose-Capillary: 117 mg/dL — ABNORMAL HIGH (ref 70–99)
Glucose-Capillary: 193 mg/dL — ABNORMAL HIGH (ref 70–99)

## 2021-01-31 LAB — MAGNESIUM: Magnesium: 1.4 mg/dL — ABNORMAL LOW (ref 1.7–2.4)

## 2021-01-31 LAB — C-REACTIVE PROTEIN: CRP: 33.5 mg/dL — ABNORMAL HIGH (ref <1.0)

## 2021-01-31 MED ORDER — MAGNESIUM SULFATE 2 GM/50ML IV SOLN
2.0000 g | Freq: Once | INTRAVENOUS | Status: AC
  Administered 2021-01-31: 2 g via INTRAVENOUS
  Filled 2021-01-31: qty 50

## 2021-01-31 MED ORDER — POTASSIUM CHLORIDE 10 MEQ/100ML IV SOLN
10.0000 meq | INTRAVENOUS | Status: AC
  Administered 2021-01-31 (×6): 10 meq via INTRAVENOUS
  Filled 2021-01-31 (×6): qty 100

## 2021-01-31 NOTE — Plan of Care (Signed)
  Problem: Education: Goal: Knowledge of General Education information will improve Description Including pain rating scale, medication(s)/side effects and non-pharmacologic comfort measures Outcome: Progressing   

## 2021-01-31 NOTE — Evaluation (Signed)
Physical Therapy Evaluation Patient Details Name: Shirley Juarez MRN: [id number] DOB: 1950-11-17 Today's Date: 01/31/2021   History of Present Illness  70 y.o. female with medical history significant for HTN, DMT2, depression/anxiety, CAD, OA, recent hospitalization from 01/12/2021-01/13/2021 for acute renal failure/hydronephrosis secondary to urethral stenosis treated with dilation and Foley catheter placement by urology with subsequent removal as an outpatient presented with fatigue and weakness.  Pt admitted 01/28/21 with hypokalemia, hyponatremia, and hypomagnesemia  Clinical Impression  Pt admitted with above diagnosis.  Pt currently with functional limitations due to the deficits listed below (see PT Problem List). Pt will benefit from skilled PT to increase their independence and safety with mobility to allow discharge to the venue listed below.  Pt assisted with ambulating short distance in hallway.  Pt reports fatigue from not sleeping well last night but overall feeling better.  Pt encouraged to remain in recliner end of session.       Follow Up Recommendations Home health PT    Equipment Recommendations  None recommended by PT    Recommendations for Other Services       Precautions / Restrictions Precautions Precautions: Fall      Mobility  Bed Mobility Overal bed mobility: Needs Assistance Bed Mobility: Supine to Sit     Supine to sit: Supervision;HOB elevated     General bed mobility comments: increased time and effort    Transfers Overall transfer level: Needs assistance Equipment used: Rolling walker (2 wheeled) Transfers: Sit to/from Stand Sit to Stand: Min assist         General transfer comment: light assist to rise and steady, cues for hand placement  Ambulation/Gait Ambulation/Gait assistance: Min guard Gait Distance (Feet): 50 Feet Assistive device: Rolling walker (2 wheeled) Gait Pattern/deviations: Step-through pattern;Step-to pattern;Decreased  stride length;Narrow base of support     General Gait Details: initially started with step to pattern but progressed to small step through pattern, distance limited by fatigue  Stairs            Wheelchair Mobility    Modified Rankin (Stroke Patients Only)       Balance Overall balance assessment: Needs assistance         Standing balance support: No upper extremity supported Standing balance-Leahy Scale: Fair Standing balance comment: static fair                             Pertinent Vitals/Pain Pain Assessment: Faces Faces Pain Scale: Hurts a little bit Pain Location: not specified, but reports better since she received tylenol Pain Descriptors / Indicators: Sore Pain Intervention(s): Repositioned;Monitored during session    Home Living Family/patient expects to be discharged to:: Private residence Living Arrangements: Spouse/significant other Available Help at Discharge: Family;Available 24 hours/day Type of Home: House Home Access: Stairs to enter Entrance Stairs-Rails: None Entrance Stairs-Number of Steps: 1 Home Layout: One level Home Equipment: Walker - 4 wheels      Prior Function Level of Independence: Independent               Hand Dominance        Extremity/Trunk Assessment        Lower Extremity Assessment Lower Extremity Assessment: Generalized weakness       Communication   Communication: No difficulties  Cognition Arousal/Alertness: Awake/alert Behavior During Therapy: WFL for tasks assessed/performed;Flat affect Overall Cognitive Status: Within Functional Limits for tasks assessed  General Comments General comments (skin integrity, edema, etc.): pt denies falls prior to admission    Exercises     Assessment/Plan    PT Assessment Patient needs continued PT services  PT Problem List Decreased balance;Decreased activity tolerance;Decreased  strength;Decreased mobility;Decreased knowledge of use of DME       PT Treatment Interventions Gait training;DME instruction;Therapeutic exercise;Balance training;Functional mobility training;Therapeutic activities;Patient/family education    PT Goals (Current goals can be found in the Care Plan section)  Acute Rehab PT Goals PT Goal Formulation: With patient Time For Goal Achievement: 02/14/21 Potential to Achieve Goals: Good    Frequency Min 3X/week   Barriers to discharge        Co-evaluation               AM-PAC PT "6 Clicks" Mobility  Outcome Measure Help needed turning from your back to your side while in a flat bed without using bedrails?: A Little Help needed moving from lying on your back to sitting on the side of a flat bed without using bedrails?: A Little Help needed moving to and from a bed to a chair (including a wheelchair)?: A Little Help needed standing up from a chair using your arms (e.g., wheelchair or bedside chair)?: A Little Help needed to walk in hospital room?: A Little Help needed climbing 3-5 steps with a railing? : A Lot 6 Click Score: 17    End of Session Equipment Utilized During Treatment: Gait belt Activity Tolerance: Patient tolerated treatment well Patient left: in chair;with call bell/phone within reach;with chair alarm set Nurse Communication: Mobility status PT Visit Diagnosis: Difficulty in walking, not elsewhere classified (R26.2);Muscle weakness (generalized) (M62.81)    Time: 9675-9163 PT Time Calculation (min) (ACUTE ONLY): 24 min   Charges:   PT Evaluation $PT Eval Low Complexity: 1 Low        Kati PT, DPT Acute Rehabilitation Services Pager: [phone] Office: [phone]   Sarajane Jews 01/31/2021, 3:23 PM

## 2021-01-31 NOTE — Progress Notes (Signed)
Patient ID: Shirley Juarez, female   DOB: 09-24-1950, 70 y.o.   MRN: [id number]  PROGRESS NOTE    Shirley Juarez  CZY:[id number] DOB: [birth year] DOA: 01/28/2021 PCP: Shirlean Mylar, MD   Brief Narrative:  70 y.o. female with medical history significant for HTN, DMT2, depression/anxiety, CAD, OA, recent hospitalization from 01/12/2021-01/13/2021 for acute renal failure/hydronephrosis secondary to urethral stenosis treated with dilation and Foley catheter placement by urology with subsequent removal as an outpatient presented with fatigue and weakness.  On presentation, WBC is up 10,000, hemoglobin of 7.4 (down from 8.5 a few weeks ago), sodium of 123, potassium 2.5, magnesium 1.6.  She was started on IV fluids.  Subsequently, she started to spike temperatures and was started on IV antibiotics.  Assessment & Plan:   UTI: Possibly present on admission Mild bilateral hydronephrosis and hydroureter/recent acute renal failure/hydronephrosis secondary to urethral stenosis treated with dilation and Foley catheter placement by urology with subsequent removal as an outpatient Urinary retention -T-max of 100.3 in the last 24 hours.  Started on broad-spectrum antibiotics on 01/29/2021.  Had urinary retention overnight on 01/29/2021 for which Foley catheter was placed. -No leukocytosis.  Urine cultures growing E faecalis 30,000 colonies per mL; follow susceptibility.  DC Rocephin.  Continue vancomycin.  Blood cultures negative so far. -CT of the chest abdomen and pelvis on 01/30/2021 showed mild bilateral hydronephrosis and hydroureter.  Communicated with Dr. Wrenn/urology via secure chat regarding hydronephrosis/hydroureter who reviewed the chart and recommended to continue Foley catheter and outpatient follow-up with urology.  Flomax was started on 01/30/2021.    Hyponatremia -Possibly from poor oral intake and use of Effexor -Sodium 123 on presentation.  129 today.  DC IV fluids since CT yesterday showed pleural effusions  and possible edema. -Encourage oral intake.  Hypokalemia -Potassium 3.2 today -Continue supplementation.  Repeat a.m. labs.  Hypomagnesemia -Replace.  Repeat a.m. labs  Generalized deconditioning and weakness -PT eval  Diabetes mellitus type 2 -Off metformin.  CBGs with SSI.  Acute on chronic microcytic anemia -Iron studies consistent with iron deficiency anemia.   -Patient was given IV iron on 01/29/2021 -Transfused 1 unit packed red cells on 01/30/2021 for hemoglobin of 6.9.  Hemoglobin 8.1 this morning.  No signs of overt bleeding.  Will need oral iron on discharge.  CKD stage IIIb -Creatinine stable.  Monitor   prolonged QT -Repeat EKG showed improved QT on 01/21/2021  Depression with anxiety -Effexor on hold due to hyponatremia -DC'd scheduled Xanax and as needed hydroxyzine because of lethargy on 01/30/2021  DVT prophylaxis: SCDs.  DC'd heparin because of anemia  code Status: Full Family Communication: Daughter at bedside on 01/31/2021 disposition Plan: Status is: Inpatient  Remains inpatient appropriate because:Inpatient level of care appropriate due to severity of illness  Dispo: The patient is from: Home              Anticipated d/c is to: Home              Patient currently is not medically stable to d/c.   Difficult to place patient No  Consultants: None  Procedures: None  Antimicrobials: Rocephin and vancomycin from 01/21/2021 onwards   Subjective: Patient seen and examined at bedside.  No vomiting chest pain reported.  Poor historian.  Daughter reports that patient had good oral intake yesterday but is more sleepy this morning. Objective: Vitals:   01/30/21 1800 01/30/21 2037 01/31/21 0500 01/31/21 0601  BP: (!) 162/92 (!) 162/92  (!) 149/71  Pulse: 97  93  99  Resp: 18 18  16   Temp: 100.3 F (37.9 C) 98.9 F (37.2 C)  98.9 F (37.2 C)  TempSrc: Oral Oral  Oral  SpO2: 98% 96%  95%  Weight:   74 kg   Height:        Intake/Output Summary (Last  24 hours) at 01/31/2021 0816 Last data filed at 01/31/2021 0606 Gross per 24 hour  Intake 2343.48 ml  Output 8650 ml  Net -6306.52 ml    Filed Weights   01/29/21 0033 01/30/21 0448 01/31/21 0500  Weight: 71 kg 68.4 kg 74 kg    Examination:  General exam: No acute distress.  Currently on room air.  Looks chronically ill and deconditioned.   Respiratory system: Bilateral decreased breath sounds at bases with scattered crackles  cardiovascular system: S1-S2 heard, rate controlled  gastrointestinal system: Abdomen is obese, soft and nontender.  Normal bowel sounds heard Extremities: Mild lower extremity edema present; no cyanosis Central nervous system: Sleepy, wakes up slightly, poor historian.  No focal neurological deficits.  Moving extremities  skin: No obvious petechiae/lesions  psychiatry: Could not be assessed because of mental status.  Hardly participates in any conversation.  Data Reviewed: I have personally reviewed following labs and imaging studies  CBC: Recent Labs  Lab 01/28/21 1851 01/29/21 0549 01/29/21 1357 01/30/21 0455 01/30/21 1651 01/31/21 0525  WBC 10.0 9.5  --  9.7  --  10.4  NEUTROABS  --   --   --  8.4*  --  8.8*  HGB 7.4* 7.2* 7.8* 6.9* 8.4* 8.1*  HCT 22.5* 21.1* 23.0* 20.9* 26.0* 23.6*  MCV 72.3* 71.5*  --  72.1*  --  72.6*  PLT 344 329  --  353  --  391    Basic Metabolic Panel: Recent Labs  Lab 01/28/21 1555 01/28/21 1851 01/29/21 0549 01/29/21 1357 01/29/21 2155 01/30/21 0455 01/31/21 0525  NA 123*  --  127*  --  126* 125* 129*  K 2.5*   < > 2.5* 3.0* 3.2* 3.0* 3.2*  CL 86*  --  94*  --  99 97* 100  CO2 23  --  21*  --  17* 19* 18*  GLUCOSE 175*  --  123*  --  156* 154* 124*  BUN 29*  --  35*  --  31* 24* 15  CREATININE 1.47*  --  1.53*  --  1.47* 1.36* 0.91  CALCIUM 8.4*  --  8.3*  --  7.7* 7.9* 8.2*  MG 1.6*  --  1.8 1.8  --  1.4* 1.4*   < > = values in this interval not displayed.    GFR: Estimated Creatinine Clearance: 50.9  mL/min (by C-G formula based on SCr of 0.91 mg/dL). Liver Function Tests: Recent Labs  Lab 01/28/21 1555 01/29/21 2155 01/30/21 0455  AST 27 32 31  ALT 17 17 17   ALKPHOS 63 44 51  BILITOT 0.6 0.5 0.3  PROT 6.9 6.0* 6.2*  ALBUMIN 3.1* 2.4* 2.5*    Recent Labs  Lab 01/28/21 1555  LIPASE 25    No results for input(s): AMMONIA in the last 168 hours. Coagulation Profile: No results for input(s): INR, PROTIME in the last 168 hours. Cardiac Enzymes: No results for input(s): CKTOTAL, CKMB, CKMBINDEX, TROPONINI in the last 168 hours. BNP (last 3 results) No results for input(s): PROBNP in the last 8760 hours. HbA1C: Recent Labs    01/29/21 0549  HGBA1C 6.5*    CBG: Recent Labs  Lab 01/29/21 1656 01/29/21 2012 01/30/21 1228 01/30/21 1708 01/30/21 2040  GLUCAP 131* 150* 145* 150* 142*    Lipid Profile: No results for input(s): CHOL, HDL, LDLCALC, TRIG, CHOLHDL, LDLDIRECT in the last 72 hours. Thyroid Function Tests: Recent Labs    01/30/21 0455  TSH 0.629    Anemia Panel: Recent Labs    01/28/21 2207 01/30/21 0455  VITAMINB12  --  515  FERRITIN 134  --   TIBC 349  --   IRON 11*  --     Sepsis Labs: Recent Labs  Lab 01/29/21 1121 01/29/21 1357  LATICACIDVEN 0.8 1.1     Recent Results (from the past 240 hour(s))  Resp Panel by RT-PCR (Flu A&B, Covid) Nasopharyngeal Swab     Status: None   Collection Time: 01/28/21  3:55 PM   Specimen: Nasopharyngeal Swab; Nasopharyngeal(NP) swabs in vial transport medium  Result Value Ref Range Status   SARS Coronavirus 2 by RT PCR NEGATIVE NEGATIVE Final    Comment: (NOTE) SARS-CoV-2 target nucleic acids are NOT DETECTED.  The SARS-CoV-2 RNA is generally detectable in upper respiratory specimens during the acute phase of infection. The lowest concentration of SARS-CoV-2 viral copies this assay can detect is 138 copies/mL. A negative result does not preclude SARS-Cov-2 infection and should not be used as the  sole basis for treatment or other patient management decisions. A negative result may occur with  improper specimen collection/handling, submission of specimen other than nasopharyngeal swab, presence of viral mutation(s) within the areas targeted by this assay, and inadequate number of viral copies(<138 copies/mL). A negative result must be combined with clinical observations, patient history, and epidemiological information. The expected result is Negative.  Fact Sheet for Patients:  BloggerCourse.com  Fact Sheet for Healthcare Providers:  SeriousBroker.it  This test is no t yet approved or cleared by the [place] FDA and  has been authorized for detection and/or diagnosis of SARS-CoV-2 by FDA under an Emergency Use Authorization (EUA). This EUA will remain  in effect (meaning this test can be used) for the duration of the COVID-19 declaration under Section 564(b)(1) of the Act, 21 U.S.C.section 360bbb-3(b)(1), unless the authorization is terminated  or revoked sooner.       Influenza A by PCR NEGATIVE NEGATIVE Final   Influenza B by PCR NEGATIVE NEGATIVE Final    Comment: (NOTE) The Xpert Xpress SARS-CoV-2/FLU/RSV plus assay is intended as an aid in the diagnosis of influenza from Nasopharyngeal swab specimens and should not be used as a sole basis for treatment. Nasal washings and aspirates are unacceptable for Xpert Xpress SARS-CoV-2/FLU/RSV testing.  Fact Sheet for Patients: BloggerCourse.com  Fact Sheet for Healthcare Providers: SeriousBroker.it  This test is not yet approved or cleared by the [place] FDA and has been authorized for detection and/or diagnosis of SARS-CoV-2 by FDA under an Emergency Use Authorization (EUA). This EUA will remain in effect (meaning this test can be used) for the duration of the COVID-19 declaration under Section 564(b)(1) of the  Act, 21 U.S.C. section 360bbb-3(b)(1), unless the authorization is terminated or revoked.  Performed at [facility], [address], [place], [place] [address]   Urine Culture     Status: Abnormal (Preliminary result)   Collection Time: 01/29/21 10:41 AM   Specimen: Urine, Clean Catch  Result Value Ref Range Status   Specimen Description   Final    URINE, CLEAN CATCH Performed at [facility], [address], [place], [place] [address]  Special Requests   Final    NONE Performed at [facility], [address], [place], [place] [address]    Culture (A)  Final    30,000 COLONIES/mL ENTEROCOCCUS FAECALIS SUSCEPTIBILITIES TO FOLLOW REPEATING Performed at [facility], [address] [place], [place] [address]    Report Status PENDING  Incomplete  Culture, blood (routine x 2)     Status: None (Preliminary result)   Collection Time: 01/29/21 10:48 AM   Specimen: BLOOD  Result Value Ref Range Status   Specimen Description   Final    BLOOD LEFT ANTECUBITAL Performed at [facility], [address] [place], [place] [address]    Special Requests   Final    BOTTLES DRAWN AEROBIC AND ANAEROBIC Blood Culture adequate volume Performed at [facility], [address] [place], [place] [address]    Culture   Final    NO GROWTH 2 DAYS Performed at [facility], [address] [place], [place] [address]    Report Status PENDING  Incomplete  Culture, blood (routine x 2)     Status: None (Preliminary result)   Collection Time: 01/29/21 10:49 AM   Specimen: BLOOD  Result Value Ref Range Status   Specimen Description   Final    BLOOD BLOOD RIGHT HAND Performed at [facility], [address] [place], [place] [address]    Special Requests   Final    BOTTLES DRAWN AEROBIC ONLY Blood Culture adequate volume Performed at [facility], [address], [place], [place] [address]    Culture   Final    NO GROWTH 2 DAYS Performed at [facility], [address], [place], [place] [address]    Report Status PENDING  Incomplete          Radiology Studies: CT ABDOMEN PELVIS WO CONTRAST  Result Date: 01/30/2021 CLINICAL DATA:  Abdominal distension, respiratory illness EXAM: CT CHEST, ABDOMEN AND PELVIS WITHOUT CONTRAST TECHNIQUE: Multidetector CT imaging of the chest, abdomen and pelvis was performed following the standard protocol without IV contrast. COMPARISON:  CT abdomen pelvis, 01/10/2021 FINDINGS: CT CHEST FINDINGS Cardiovascular: Aortic atherosclerosis. Normal heart size. Three-vessel coronary artery calcifications. No pericardial effusion. Mediastinum/Nodes: No enlarged mediastinal, hilar, or axillary lymph nodes. Calcified mediastinal and left hilar lymph nodes thyroid gland, trachea, and esophagus demonstrate no significant findings. Lungs/Pleura: Trace bilateral pleural effusions. Interlobular septal thickening. Scattered, nonspecific ground-glass. Definitively benign calcified nodule of the left pulmonary apex (series 4, image 30). Musculoskeletal: No chest wall mass or suspicious bone lesions identified. CT ABDOMEN PELVIS FINDINGS Hepatobiliary: No solid liver abnormality is seen. Faintly calcified gallstone in the dependent gallbladder (series 2, image 57). Gallbladder wall thickening, or biliary dilatation. Pancreas: Unremarkable. No pancreatic ductal dilatation or surrounding inflammatory changes. Spleen: Normal in size without significant abnormality. Adrenals/Urinary Tract: Adrenal glands are unremarkable. Mild bilateral hydronephrosis and hydroureter, substantially improved compared to prior examination, without obstructing calculi identified. Small, nonobstructive left renal calculus (series 2, image 62). Foley catheter decompresses the thickened urinary bladder (series 6, image 104). Stomach/Bowel: Stomach is within  normal limits. Appendix appears normal. No evidence of bowel wall thickening, distention, or inflammatory changes. Vascular/Lymphatic: Aortic atherosclerosis. No enlarged abdominal or pelvic lymph nodes. Reproductive: Status post hysterectomy. Other: No abdominal wall hernia or abnormality. No abdominopelvic ascites. Musculoskeletal: No acute or significant osseous findings. IMPRESSION: 1. Mild bilateral hydronephrosis and hydroureter, substantially improved compared to prior examination, without obstructing  calculi identified. Small, nonobstructive left renal calculus. 2. Foley catheter decompresses the thickened urinary bladder. 3. Trace bilateral pleural effusions. Interlobular septal thickening and scattered, nonspecific ground-glass, findings generally consistent with mild edema and/or infection. 4. Cholelithiasis without evidence of acute cholecystitis. 5. Coronary artery disease. Aortic Atherosclerosis (ICD10-I70.0). Electronically Signed   By: Lauralyn PrimesAlex  Bibbey M.D.   On: 01/30/2021 15:39   CT CHEST WO CONTRAST  Result Date: 01/30/2021 CLINICAL DATA:  Abdominal distension, respiratory illness EXAM: CT CHEST, ABDOMEN AND PELVIS WITHOUT CONTRAST TECHNIQUE: Multidetector CT imaging of the chest, abdomen and pelvis was performed following the standard protocol without IV contrast. COMPARISON:  CT abdomen pelvis, 01/10/2021 FINDINGS: CT CHEST FINDINGS Cardiovascular: Aortic atherosclerosis. Normal heart size. Three-vessel coronary artery calcifications. No pericardial effusion. Mediastinum/Nodes: No enlarged mediastinal, hilar, or axillary lymph nodes. Calcified mediastinal and left hilar lymph nodes thyroid gland, trachea, and esophagus demonstrate no significant findings. Lungs/Pleura: Trace bilateral pleural effusions. Interlobular septal thickening. Scattered, nonspecific ground-glass. Definitively benign calcified nodule of the left pulmonary apex (series 4, image 30). Musculoskeletal: No chest wall mass or  suspicious bone lesions identified. CT ABDOMEN PELVIS FINDINGS Hepatobiliary: No solid liver abnormality is seen. Faintly calcified gallstone in the dependent gallbladder (series 2, image 57). Gallbladder wall thickening, or biliary dilatation. Pancreas: Unremarkable. No pancreatic ductal dilatation or surrounding inflammatory changes. Spleen: Normal in size without significant abnormality. Adrenals/Urinary Tract: Adrenal glands are unremarkable. Mild bilateral hydronephrosis and hydroureter, substantially improved compared to prior examination, without obstructing calculi identified. Small, nonobstructive left renal calculus (series 2, image 62). Foley catheter decompresses the thickened urinary bladder (series 6, image 104). Stomach/Bowel: Stomach is within normal limits. Appendix appears normal. No evidence of bowel wall thickening, distention, or inflammatory changes. Vascular/Lymphatic: Aortic atherosclerosis. No enlarged abdominal or pelvic lymph nodes. Reproductive: Status post hysterectomy. Other: No abdominal wall hernia or abnormality. No abdominopelvic ascites. Musculoskeletal: No acute or significant osseous findings. IMPRESSION: 1. Mild bilateral hydronephrosis and hydroureter, substantially improved compared to prior examination, without obstructing calculi identified. Small, nonobstructive left renal calculus. 2. Foley catheter decompresses the thickened urinary bladder. 3. Trace bilateral pleural effusions. Interlobular septal thickening and scattered, nonspecific ground-glass, findings generally consistent with mild edema and/or infection. 4. Cholelithiasis without evidence of acute cholecystitis. 5. Coronary artery disease. Aortic Atherosclerosis (ICD10-I70.0). Electronically Signed   By: Lauralyn PrimesAlex  Bibbey M.D.   On: 01/30/2021 15:39        Scheduled Meds:  Chlorhexidine Gluconate Cloth  6 each Topical Daily   feeding supplement  237 mL Oral BID BM   folic acid  1 mg Oral Daily   insulin  aspart  0-5 Units Subcutaneous QHS   insulin aspart  0-9 Units Subcutaneous TID WC   multivitamin with minerals  1 tablet Oral Daily   pantoprazole  40 mg Oral Daily   tamsulosin  0.4 mg Oral QPC supper   Continuous Infusions:  0.9 % NaCl with KCl 40 mEq / L 100 mL/hr at 01/31/21 0231   cefTRIAXone (ROCEPHIN)  IV 2 g (01/30/21 1432)   vancomycin            Glade LloydKshitiz Zoraida Havrilla, MD [facility] 01/31/2021, 8:16 AM

## 2021-02-01 DIAGNOSIS — D649 Anemia, unspecified: Secondary | ICD-10-CM

## 2021-02-01 DIAGNOSIS — E119 Type 2 diabetes mellitus without complications: Secondary | ICD-10-CM

## 2021-02-01 LAB — BASIC METABOLIC PANEL
Anion gap: 11 (ref 5–15)
BUN: 16 mg/dL (ref 8–23)
CO2: 20 mmol/L — ABNORMAL LOW (ref 22–32)
Calcium: 8.1 mg/dL — ABNORMAL LOW (ref 8.9–10.3)
Chloride: 98 mmol/L (ref 98–111)
Creatinine, Ser: 0.94 mg/dL (ref 0.44–1.00)
GFR, Estimated: >60 mL/min (ref >60)
Glucose, Bld: 139 mg/dL — ABNORMAL HIGH (ref 70–99)
Potassium: 3.3 mmol/L — ABNORMAL LOW (ref 3.5–5.1)
Sodium: 129 mmol/L — ABNORMAL LOW (ref 135–145)

## 2021-02-01 LAB — CBC WITH DIFFERENTIAL/PLATELET
Abs Immature Granulocytes: 0.18 10*3/uL — ABNORMAL HIGH (ref 0.00–0.07)
Basophils Absolute: 0 10*3/uL (ref 0.0–0.1)
Basophils Relative: 0 %
Eosinophils Absolute: 0 10*3/uL (ref 0.0–0.5)
Eosinophils Relative: 0 %
HCT: 22.7 % — ABNORMAL LOW (ref 36.0–46.0)
Hemoglobin: 7.8 g/dL — ABNORMAL LOW (ref 12.0–15.0)
Immature Granulocytes: 2 %
Lymphocytes Relative: 12 %
Lymphs Abs: 1 10*3/uL (ref 0.7–4.0)
MCH: 24.9 pg — ABNORMAL LOW (ref 26.0–34.0)
MCHC: 34.4 g/dL (ref 30.0–36.0)
MCV: 72.5 fL — ABNORMAL LOW (ref 80.0–100.0)
Monocytes Absolute: 0.8 10*3/uL (ref 0.1–1.0)
Monocytes Relative: 9 %
Neutro Abs: 6.8 10*3/uL (ref 1.7–7.7)
Neutrophils Relative %: 77 %
Platelets: 410 10*3/uL — ABNORMAL HIGH (ref 150–400)
RBC: 3.13 MIL/uL — ABNORMAL LOW (ref 3.87–5.11)
RDW: 18.5 % — ABNORMAL HIGH (ref 11.5–15.5)
WBC: 8.8 10*3/uL (ref 4.0–10.5)
nRBC: 0 % (ref 0.0–0.2)

## 2021-02-01 LAB — URINE CULTURE: Culture: 30000 — AB

## 2021-02-01 LAB — MAGNESIUM: Magnesium: 1.5 mg/dL — ABNORMAL LOW (ref 1.7–2.4)

## 2021-02-01 LAB — GLUCOSE, CAPILLARY
Glucose-Capillary: 155 mg/dL — ABNORMAL HIGH (ref 70–99)
Glucose-Capillary: 162 mg/dL — ABNORMAL HIGH (ref 70–99)
Glucose-Capillary: 137 mg/dL — ABNORMAL HIGH (ref 70–99)

## 2021-02-01 MED ORDER — AMOXICILLIN 500 MG PO CAPS: 500.0000 mg | ORAL_CAPSULE | Freq: Three times a day (TID) | ORAL | 0 refills | Status: AC

## 2021-02-01 MED ORDER — FERROUS GLUCONATE 324 (38 FE) MG PO TABS: 324.0000 mg | ORAL_TABLET | Freq: Every day | ORAL | 3 refills | Status: DC

## 2021-02-01 MED ORDER — TAMSULOSIN HCL 0.4 MG PO CAPS: 0.4000 mg | ORAL_CAPSULE | Freq: Every day | ORAL | 2 refills | Status: AC

## 2021-02-01 MED ORDER — METOPROLOL TARTRATE 5 MG/5ML IV SOLN
2.0000 mg | Freq: Once | INTRAVENOUS | Status: AC
  Administered 2021-02-01: 2 mg via INTRAVENOUS
  Filled 2021-02-01: qty 5

## 2021-02-01 MED ORDER — AMOXICILLIN 500 MG PO CAPS
500.0000 mg | ORAL_CAPSULE | Freq: Three times a day (TID) | ORAL | Status: DC
  Administered 2021-02-01: 500 mg via ORAL
  Filled 2021-02-01 (×2): qty 1

## 2021-02-01 MED ORDER — MAGNESIUM SULFATE 2 GM/50ML IV SOLN
2.0000 g | Freq: Once | INTRAVENOUS | Status: AC
  Administered 2021-02-01: 2 g via INTRAVENOUS
  Filled 2021-02-01: qty 50

## 2021-02-01 MED ORDER — POTASSIUM CHLORIDE CRYS ER 10 MEQ PO TBCR
40.0000 meq | EXTENDED_RELEASE_TABLET | Freq: Once | ORAL | Status: AC
  Administered 2021-02-01: 40 meq via ORAL
  Filled 2021-02-01: qty 4

## 2021-02-01 NOTE — TOC Initial Note (Signed)
Transition of Care [facility]) - Initial/Assessment Note    Patient Details  Name: Shirley Juarez MRN: [id number] Date of Birth: December 29, 1950  [facility]) CM/SW Contact:    Shirley Rogue, LCSW Phone Number: 02/01/2021, 11:47 AM  Clinical Narrative:    Patient seen in follow up to PT recommendation of HH PT.  Found patient in bed, in discomfort, minimally engaged.  Daughter Shirley Juarez was at bedside. [name] at home here in [place] with her husband, daughter lives nearby.  She is open to [facility] PT services, no preference of provider. Contacted Shirley Juarez with [facility] who agrees to service patient for [facility] needs.  No further needs identified. TOC will continue to follow during the course of hospitalization.                Expected Discharge Plan: Home w Home Health Services Barriers to Discharge: No Barriers Identified   Patient Goals and CMS Choice     Choice offered to / list presented to : Patient, Adult Children  Expected Discharge Plan and Services Expected Discharge Plan: Home w Home Health Services   Discharge Planning Services: CM Consult Post Acute Care Choice: Home Health Living arrangements for the past 2 months: Single Family Home                                      Prior Living Arrangements/Services Living arrangements for the past 2 months: Single Family Home Lives with:: Spouse Patient language and need for interpreter reviewed:: Yes        Need for Family Participation in Patient Care: Yes (Comment) Care giver support system in place?: Yes (comment)   Criminal Activity/Legal Involvement Pertinent to Current Situation/Hospitalization: No - Comment as needed  Activities of Daily Living Home Assistive Devices/Equipment: None ADL Screening (condition at time of admission) Patient's cognitive ability adequate to safely complete daily activities?: Yes Is the patient deaf or have difficulty hearing?: No Does the patient have difficulty seeing, even when wearing  glasses/contacts?: No Does the patient have difficulty concentrating, remembering, or making decisions?: No Patient able to express need for assistance with ADLs?: No Does the patient have difficulty dressing or bathing?: No Independently performs ADLs?: No Communication: Independent Dressing (OT): Independent Grooming: Independent Feeding: Independent Bathing: Independent Toileting: Needs assistance In/Out Bed: Needs assistance Is this a change from baseline?: Pre-admission baseline Walks in Home: Needs assistance Is this a change from baseline?: Pre-admission baseline Does the patient have difficulty walking or climbing stairs?: Yes Weakness of Legs: Both Weakness of Arms/Hands: None  Permission Sought/Granted Permission sought to share information with : Family Supports Permission granted to share information with : Yes, Verbal Permission Granted  Share Information with NAME: Shirley Juarez-daughter           Emotional Assessment Appearance:: Appears stated age Attitude/Demeanor/Rapport: Engaged Affect (typically observed): Appropriate Orientation: : Oriented to Self, Oriented to Place, Oriented to  Time, Oriented to Situation Alcohol / Substance Use: Not Applicable Psych Involvement: No (comment)  Admission diagnosis:  Hypokalemia [E87.6] Hypomagnesemia [E83.42] Hyponatremia [E87.1] Mild dehydration [E86.0] NSTEMI (non-ST elevated myocardial infarction) (HCC) [I21.4] Fatigue, unspecified type [R53.83] Anemia, unspecified type [D64.9] Patient Active Problem List   Diagnosis Date Noted   Hyponatremia 01/28/2021   Hypomagnesemia 01/28/2021   Microcytic anemia 01/28/2021   Prolonged QT interval 01/28/2021   CKD (chronic kidney disease) stage 3, GFR 30-59 ml/min (HCC) 01/28/2021   Generalized  weakness 01/28/2021   Depression with anxiety 01/28/2021   ARF (acute renal failure) (HCC) 01/12/2021   Type 2 diabetes mellitus with hemoglobin A1c goal of less than 7.5% (HCC)     Headache    Hypokalemia    Pain in the chest 02/06/2016   Hypercalcemia 02/06/2016   GERD (gastroesophageal reflux disease) 02/06/2016   Coronary atherosclerosis of native coronary artery 09/04/2012   Essential hypertension 09/04/2012   Hyperlipidemia 09/04/2012   PCP:  Shirlean Mylar, MD Pharmacy:   [facility] [id number] - [place], [place] - [address] [place] [address] Phone: [phone] Fax: [phone]     Social Determinants of Health (SDOH) Interventions    Readmission Risk Interventions No flowsheet data found.

## 2021-02-01 NOTE — Discharge Summary (Signed)
Physician Discharge Summary  Shirley SoleHelen D Juarez GNF:[id number] DOB: 06/17/1950 DOA: 01/28/2021  PCP: [name], Carol, MD  Admit date: 01/28/2021 Discharge date: 02/01/2021  Time spent: 60 minutes  Recommendations for Outpatient Follow-up:  Follow-up PCP in 1 week Check BMP in 1 week   Discharge Diagnoses:  Principal Problem:   Hypokalemia Active Problems:   Essential hypertension   Type 2 diabetes mellitus with hemoglobin A1c goal of less than 7.5% (HCC)   Hyponatremia   Hypomagnesemia   Microcytic anemia   Prolonged QT interval   CKD (chronic kidney disease) stage 3, GFR 30-59 ml/min (HCC)   Generalized weakness   Depression with anxiety   Discharge Condition: Stable  Diet recommendation: Heart healthy diet  Filed Weights   01/30/21 0448 01/31/21 0500 02/01/21 0420  Weight: 68.4 kg 74 kg 72.7 kg    History of present illness:  70 year old female with history of hypertension, diabetes mellitus type 2, depression/anxiety, CAD, osteoarthritis, recent hospitalization from 01/12/2021 to 01/13/2021 for 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  Foley catheter was subsequently removed as outpatient.  She presented with fatigue and weakness.  On presentation WBC was elevated up to 10,000, hemoglobin of 7.4, sodium 123, potassium 2.5, magnesium 1.6.  She was started on IV fluids.  Hospital Course:   UTI -CT of the chest abdomen and pelvis on 01/30/2021 showed mild bilateral hydronephrosis and hydroureter.  Dr Hanley BenAlekh communicated with Dr. Wrenn/urology via secure chat regarding hydronephrosis/hydroureter who reviewed the chart and recommended to continue Foley catheter and outpatient follow-up with urology.  Flomax was started on 01/30/2021.   -She was started on broad-spectrum antibiotics on 8/28.  Developed urinary retention for which Foley catheter was again placed.   -Urine culture grew Enterococcus faecalis 30,000  colonies per mL, sensitive to ampicillin -We will discontinue the IV vancomycin and start amoxicillin 500 mg p.o. 3 times daily for 7 more days  Hyponatremia -Likely from poor p.o. intake, sodium was 123 on presentation -Sodium has improved to 129 -Patient wants to go home, will need repeat BMP in 1 week to check for resolution of hyponatremia  Hypomagnesemia -Today magnesium 1.5, will give magnesium sulfate 2 g IV x1 before discharge  Hypokalemia -Potassium is 3.3 today, will give K-Dur 40 mEq p.o. x1 -She will need repeat BMP in 1 week  Generalized deconditioning and weakness -Patient will be discharged home health PT  Diabetes mellitus type 2 -Continue metformin  Acute on chronic microcytic anemia -Iron studies were consistent with iron deficiency anemia -Patient was given IV iron on 01/2821 -Also received 1 unit PRBC on 01/2921 for hemoglobin of 6.9 -Today hemoglobin is 7.8 -We will discharge on ferrous gluconate 324 mg p.o. daily  CKD stage IIIb -Creatinine stable  Depression with anxiety -Effexor was held on admission for possible culprit for hyponatremia -Hyponatremia has significantly improved, will restart Effexor -BMP needs to be checked in 1 week at PCP office -Continue Xanax, as needed hydralazine  Hypertension -Continue home medications  Procedures:   Consultations:   Discharge Exam: Vitals:   02/01/21 0420 02/01/21 0611  BP: (!) 189/77 (!) 164/75  Pulse: 100 77  Resp: 20   Temp: 99.7 F (37.6 C)   SpO2: 98% 96%    General: Appears in no acute distress Cardiovascular: S1-S2, regular, no murmur auscultated Respiratory: Clear to auscultation bilaterally  Discharge Instructions   Discharge Instructions     Diet - low sodium heart healthy   Complete  by: As directed    Discharge instructions   Complete by: As directed    You will need BMP in one week at PCP office   Increase activity slowly   Complete by: As directed       Allergies as  of 02/01/2021       Reactions   Latex Rash   Codeine Nausea And Vomiting   Crestor [rosuvastatin Calcium] Nausea And Vomiting   Fish Oil Nausea And Vomiting   Hydralazine Other (See Comments), Cough   100 mg three times a day causes excessive coughing   Lipitor [atorvastatin Calcium] Nausea And Vomiting   Peanut-containing Drug Products Hives   Statins Nausea And Vomiting   Welchol [colesevelam Hcl] Nausea And Vomiting   Zithromax [azithromycin] Nausea And Vomiting   Other Other (See Comments)   Pet dander        Medication List     STOP taking these medications    cetirizine 10 MG tablet Commonly known as: ZYRTEC       TAKE these medications    acetaminophen 325 MG tablet Commonly known as: TYLENOL Take 325-650 mg by mouth every 6 (six) hours as needed for mild pain or headache.   ALPRAZolam 0.5 MG tablet Commonly known as: XANAX Take 0.5 mg by mouth in the morning and at bedtime.   amoxicillin 500 MG capsule Commonly known as: AMOXIL Take 1 capsule (500 mg total) by mouth every 8 (eight) hours for 7 days. What changed:  how much to take when to take this additional instructions   aspirin EC 81 MG tablet Take 81 mg by mouth at bedtime.   CALCIUM CITRATE + D3 PO Take 1 tablet by mouth every Monday, Wednesday, and Friday.   estradiol 2 MG tablet Commonly known as: ESTRACE Take 1 mg by mouth 2 (two) times a week.   ferrous gluconate 324 MG tablet Commonly known as: FERGON Take 1 tablet (324 mg total) by mouth daily with breakfast.   gemfibrozil 600 MG tablet Commonly known as: LOPID Take 600 mg by mouth 2 (two) times daily before a meal.   hydrALAZINE 100 MG tablet Commonly known as: APRESOLINE Take 1 tablet (100 mg total) by mouth in the morning and at bedtime.   hydrOXYzine 25 MG tablet Commonly known as: ATARAX/VISTARIL Take 25 mg by mouth at bedtime.   lansoprazole 30 MG capsule Commonly known as: PREVACID Take 30 mg by mouth daily before  breakfast.   metFORMIN 500 MG tablet Commonly known as: GLUCOPHAGE Take 1,000 mg by mouth daily with breakfast.   ondansetron 8 MG tablet Commonly known as: ZOFRAN Take 8 mg by mouth daily as needed for nausea or vomiting.   tamsulosin 0.4 MG Caps capsule Commonly known as: FLOMAX Take 1 capsule (0.4 mg total) by mouth daily after supper.   venlafaxine XR 150 MG 24 hr capsule Commonly known as: EFFEXOR-XR Take 150 mg by mouth at bedtime.       Allergies  Allergen Reactions   Latex Rash   Codeine Nausea And Vomiting   Crestor [Rosuvastatin Calcium] Nausea And Vomiting   Fish Oil Nausea And Vomiting   Hydralazine Other (See Comments) and Cough    100 mg three times a day causes excessive coughing   Lipitor [Atorvastatin Calcium] Nausea And Vomiting   Peanut-Containing Drug Products Hives   Statins Nausea And Vomiting   Welchol [Colesevelam Hcl] Nausea And Vomiting   Zithromax [Azithromycin] Nausea And Vomiting   Other Other (See Comments)  Pet dander    Follow-up Information     [facility], [facility] Follow up.   Specialty: Home Health Services Why: This is the agency that will be providing physical therapy in your home Contact information: [address] [place] [address] [phone]                  The results of significant diagnostics from this hospitalization (including imaging, microbiology, ancillary and laboratory) are listed below for reference.    Significant Diagnostic Studies: CT ABDOMEN PELVIS WO CONTRAST  Result Date: 01/30/2021 CLINICAL DATA:  Abdominal distension, respiratory illness EXAM: CT CHEST, ABDOMEN AND PELVIS WITHOUT CONTRAST TECHNIQUE: Multidetector CT imaging of the chest, abdomen and pelvis was performed following the standard protocol without IV contrast. COMPARISON:  CT abdomen pelvis, 01/10/2021 FINDINGS: CT CHEST FINDINGS Cardiovascular: Aortic atherosclerosis. Normal heart size. Three-vessel coronary  artery calcifications. No pericardial effusion. Mediastinum/Nodes: No enlarged mediastinal, hilar, or axillary lymph nodes. Calcified mediastinal and left hilar lymph nodes thyroid gland, trachea, and esophagus demonstrate no significant findings. Lungs/Pleura: Trace bilateral pleural effusions. Interlobular septal thickening. Scattered, nonspecific ground-glass. Definitively benign calcified nodule of the left pulmonary apex (series 4, image 30). Musculoskeletal: No chest wall mass or suspicious bone lesions identified. CT ABDOMEN PELVIS FINDINGS Hepatobiliary: No solid liver abnormality is seen. Faintly calcified gallstone in the dependent gallbladder (series 2, image 57). Gallbladder wall thickening, or biliary dilatation. Pancreas: Unremarkable. No pancreatic ductal dilatation or surrounding inflammatory changes. Spleen: Normal in size without significant abnormality. Adrenals/Urinary Tract: Adrenal glands are unremarkable. Mild bilateral hydronephrosis and hydroureter, substantially improved compared to prior examination, without obstructing calculi identified. Small, nonobstructive left renal calculus (series 2, image 62). Foley catheter decompresses the thickened urinary bladder (series 6, image 104). Stomach/Bowel: Stomach is within normal limits. Appendix appears normal. No evidence of bowel wall thickening, distention, or inflammatory changes. Vascular/Lymphatic: Aortic atherosclerosis. No enlarged abdominal or pelvic lymph nodes. Reproductive: Status post hysterectomy. Other: No abdominal wall hernia or abnormality. No abdominopelvic ascites. Musculoskeletal: No acute or significant osseous findings. IMPRESSION: 1. Mild bilateral hydronephrosis and hydroureter, substantially improved compared to prior examination, without obstructing calculi identified. Small, nonobstructive left renal calculus. 2. Foley catheter decompresses the thickened urinary bladder. 3. Trace bilateral pleural effusions.  Interlobular septal thickening and scattered, nonspecific ground-glass, findings generally consistent with mild edema and/or infection. 4. Cholelithiasis without evidence of acute cholecystitis. 5. Coronary artery disease. Aortic Atherosclerosis (ICD10-I70.0). Electronically Signed   By: Lauralyn Primes M.D.   On: 01/30/2021 15:39   DG Chest 2 View  Result Date: 01/28/2021 CLINICAL DATA:  Weakness.  Generalized fatigue and nausea. EXAM: CHEST - 2 VIEW COMPARISON:  February 05, 2016 FINDINGS: The heart size and mediastinal contours are within normal limits. Both lungs are clear. The visualized skeletal structures are unremarkable. IMPRESSION: No active cardiopulmonary disease. Electronically Signed   By: Gerome Sam III M.D.   On: 01/28/2021 16:17   CT HEAD WO CONTRAST ( )  Result Date: 01/28/2021 CLINICAL DATA:  Delirium, weakness, and fatigue. EXAM: CT HEAD WITHOUT CONTRAST TECHNIQUE: Contiguous axial images were obtained from the base of the skull through the vertex without intravenous contrast. COMPARISON:  CT head dated February 06, 2016. FINDINGS: Brain: No evidence of acute infarction, hemorrhage, hydrocephalus, extra-axial collection or mass lesion/mass effect. Stable mild chronic microvascular ischemic changes. Vascular: Calcified atherosclerosis at the skullbase. No hyperdense vessel. Skull: Normal. Negative for fracture or focal lesion. Sinuses/Orbits: No acute finding. Other: None. IMPRESSION: 1. No acute intracranial abnormality.  Electronically Signed   By: Obie Dredge M.D.   On: 01/28/2021 16:48   CT CHEST WO CONTRAST  Result Date: 01/30/2021 CLINICAL DATA:  Abdominal distension, respiratory illness EXAM: CT CHEST, ABDOMEN AND PELVIS WITHOUT CONTRAST TECHNIQUE: Multidetector CT imaging of the chest, abdomen and pelvis was performed following the standard protocol without IV contrast. COMPARISON:  CT abdomen pelvis, 01/10/2021 FINDINGS: CT CHEST FINDINGS Cardiovascular: Aortic  atherosclerosis. Normal heart size. Three-vessel coronary artery calcifications. No pericardial effusion. Mediastinum/Nodes: No enlarged mediastinal, hilar, or axillary lymph nodes. Calcified mediastinal and left hilar lymph nodes thyroid gland, trachea, and esophagus demonstrate no significant findings. Lungs/Pleura: Trace bilateral pleural effusions. Interlobular septal thickening. Scattered, nonspecific ground-glass. Definitively benign calcified nodule of the left pulmonary apex (series 4, image 30). Musculoskeletal: No chest wall mass or suspicious bone lesions identified. CT ABDOMEN PELVIS FINDINGS Hepatobiliary: No solid liver abnormality is seen. Faintly calcified gallstone in the dependent gallbladder (series 2, image 57). Gallbladder wall thickening, or biliary dilatation. Pancreas: Unremarkable. No pancreatic ductal dilatation or surrounding inflammatory changes. Spleen: Normal in size without significant abnormality. Adrenals/Urinary Tract: Adrenal glands are unremarkable. Mild bilateral hydronephrosis and hydroureter, substantially improved compared to prior examination, without obstructing calculi identified. Small, nonobstructive left renal calculus (series 2, image 62). Foley catheter decompresses the thickened urinary bladder (series 6, image 104). Stomach/Bowel: Stomach is within normal limits. Appendix appears normal. No evidence of bowel wall thickening, distention, or inflammatory changes. Vascular/Lymphatic: Aortic atherosclerosis. No enlarged abdominal or pelvic lymph nodes. Reproductive: Status post hysterectomy. Other: No abdominal wall hernia or abnormality. No abdominopelvic ascites. Musculoskeletal: No acute or significant osseous findings. IMPRESSION: 1. Mild bilateral hydronephrosis and hydroureter, substantially improved compared to prior examination, without obstructing calculi identified. Small, nonobstructive left renal calculus. 2. Foley catheter decompresses the thickened urinary  bladder. 3. Trace bilateral pleural effusions. Interlobular septal thickening and scattered, nonspecific ground-glass, findings generally consistent with mild edema and/or infection. 4. Cholelithiasis without evidence of acute cholecystitis. 5. Coronary artery disease. Aortic Atherosclerosis (ICD10-I70.0). Electronically Signed   By: Lauralyn Primes M.D.   On: 01/30/2021 15:39   CT RENAL STONE STUDY  Result Date: 01/11/2021 CLINICAL DATA:  Lower pelvic pain for 1 week with incontinence. EXAM: CT ABDOMEN AND PELVIS WITHOUT CONTRAST TECHNIQUE: Multidetector CT imaging of the abdomen and pelvis was performed following the standard protocol without IV contrast. COMPARISON:  None. FINDINGS: Lower chest: Lung bases are clear. Heart is at the upper limits of normal in size. Coronary artery calcification. No pericardial or pleural effusion. Hepatobiliary: Liver and gallbladder are unremarkable. No biliary ductal dilatation. Pancreas: Negative. Spleen: Negative. Adrenals/Urinary Tract: Adrenal glands are unremarkable. There may be a punctate stone in the right kidney. Moderate to severe bilateral hydronephrosis with a markedly distended bladder. No additional urinary stones. Stomach/Bowel: Stomach is decompressed. Small bowel, appendix and colon are otherwise unremarkable. Vascular/Lymphatic: Atherosclerotic calcification of the aorta. No pathologically enlarged lymph nodes. Reproductive: Hysterectomy.  No adnexal mass. Other: No free fluid.  Mesenteries and peritoneum are unremarkable. Musculoskeletal: Degenerative changes in the spine. No worrisome lytic or sclerotic lesions. Well-circumscribed mildly sclerotic lesion in the medial left iliac wing has a benign appearance. IMPRESSION: 1. Moderate to severe bilateral hydronephrosis with marked distention of the bladder. No obstructing urinary stone. 2. Punctate right renal stone. 3. Aortic atherosclerosis (ICD10-I70.0). Coronary artery calcification. Electronically Signed    By: Leanna Battles M.D.   On: 01/11/2021 15:12    Microbiology: Recent Results (from the past 240 hour(s))  Resp Panel by RT-PCR (Flu  A&B, Covid) Nasopharyngeal Swab     Status: None   Collection Time: 01/28/21  3:55 PM   Specimen: Nasopharyngeal Swab; Nasopharyngeal(NP) swabs in vial transport medium  Result Value Ref Range Status   SARS Coronavirus 2 by RT PCR NEGATIVE NEGATIVE Final    Comment: (NOTE) SARS-CoV-2 target nucleic acids are NOT DETECTED.  The SARS-CoV-2 RNA is generally detectable in upper respiratory specimens during the acute phase of infection. The lowest concentration of SARS-CoV-2 viral copies this assay can detect is 138 copies/mL. A negative result does not preclude SARS-Cov-2 infection and should not be used as the Juarez basis for treatment or other patient management decisions. A negative result may occur with  improper specimen collection/handling, submission of specimen other than nasopharyngeal swab, presence of viral mutation(s) within the areas targeted by this assay, and inadequate number of viral copies(<138 copies/mL). A negative result must be combined with clinical observations, patient history, and epidemiological information. The expected result is Negative.  Fact Sheet for Patients:  BloggerCourse.com  Fact Sheet for Healthcare Providers:  SeriousBroker.it  This test is no t yet approved or cleared by the [place] FDA and  has been authorized for detection and/or diagnosis of SARS-CoV-2 by FDA under an Emergency Use Authorization (EUA). This EUA will remain  in effect (meaning this test can be used) for the duration of the COVID-19 declaration under Section 564(b)(1) of the Act, 21 U.S.C.section 360bbb-3(b)(1), unless the authorization is terminated  or revoked sooner.       Influenza A by PCR NEGATIVE NEGATIVE Final   Influenza B by PCR NEGATIVE NEGATIVE Final    Comment:  (NOTE) The Xpert Xpress SARS-CoV-2/FLU/RSV plus assay is intended as an aid in the diagnosis of influenza from Nasopharyngeal swab specimens and should not be used as a Juarez basis for treatment. Nasal washings and aspirates are unacceptable for Xpert Xpress SARS-CoV-2/FLU/RSV testing.  Fact Sheet for Patients: BloggerCourse.com  Fact Sheet for Healthcare Providers: SeriousBroker.it  This test is not yet approved or cleared by the [place] FDA and has been authorized for detection and/or diagnosis of SARS-CoV-2 by FDA under an Emergency Use Authorization (EUA). This EUA will remain in effect (meaning this test can be used) for the duration of the COVID-19 declaration under Section 564(b)(1) of the Act, 21 U.S.C. section 360bbb-3(b)(1), unless the authorization is terminated or revoked.  Performed at [facility], [address] [place], [place] [address]   Urine Culture     Status: Abnormal   Collection Time: 01/29/21 10:41 AM   Specimen: Urine, Clean Catch  Result Value Ref Range Status   Specimen Description   Final    URINE, CLEAN CATCH Performed at [facility], [address] [place], [place] [address]    Special Requests   Final    NONE Performed at [facility], [address], [place], [place] [address]    Culture 30,000 COLONIES/mL ENTEROCOCCUS FAECALIS (A)  Final   Report Status 02/01/2021 FINAL  Final   Organism ID, Bacteria ENTEROCOCCUS FAECALIS (A)  Final      Susceptibility   Enterococcus faecalis - MIC*    AMPICILLIN <=2 SENSITIVE Sensitive     NITROFURANTOIN <=16 SENSITIVE Sensitive     VANCOMYCIN 1 SENSITIVE Sensitive     * 30,000 COLONIES/mL ENTEROCOCCUS FAECALIS  Culture, blood (routine x 2)     Status: None (Preliminary result)   Collection Time: 01/29/21 10:48 AM   Specimen: BLOOD  Result Value  Ref Range Status   Specimen Description    Final    BLOOD LEFT ANTECUBITAL Performed at [facility], [address] [place], [place] [address]    Special Requests   Final    BOTTLES DRAWN AEROBIC AND ANAEROBIC Blood Culture adequate volume Performed at [facility], [address], [place], [place] [address]    Culture   Final    NO GROWTH 3 DAYS Performed at [facility], [address], [place], [place] [address]    Report Status PENDING  Incomplete  Culture, blood (routine x 2)     Status: None (Preliminary result)   Collection Time: 01/29/21 10:49 AM   Specimen: BLOOD  Result Value Ref Range Status   Specimen Description   Final    BLOOD BLOOD RIGHT HAND Performed at [facility], [address], [place], [place] [address]    Special Requests   Final    BOTTLES DRAWN AEROBIC ONLY Blood Culture adequate volume Performed at [facility], [address], [place], [place] [address]    Culture   Final    NO GROWTH 3 DAYS Performed at [facility], [address], [place], [place] [address]    Report Status PENDING  Incomplete     Labs: Basic Metabolic Panel: Recent Labs  Lab 01/29/21 0549 01/29/21 1357 01/29/21 2155 01/30/21 0455 01/31/21 0525 02/01/21 0519  NA 127*  --  126* 125* 129* 129*  K 2.5* 3.0* 3.2* 3.0* 3.2* 3.3*  CL 94*  --  99 97* 100 98  CO2 21*  --  17* 19* 18* 20*  GLUCOSE 123*  --  156* 154* 124* 139*  BUN 35*  --  31* 24* 15 16  CREATININE 1.53*  --  1.47* 1.36* 0.91 0.94  CALCIUM 8.3*  --  7.7* 7.9* 8.2* 8.1*  MG 1.8 1.8  --  1.4* 1.4* 1.5*   Liver Function Tests: Recent Labs  Lab 01/28/21 1555 01/29/21 2155 01/30/21 0455  AST 27 32 31  ALT 17 17 17   ALKPHOS 63 44 51  BILITOT 0.6 0.5 0.3  PROT 6.9 6.0* 6.2*  ALBUMIN 3.1* 2.4* 2.5*   Recent Labs  Lab 01/28/21 1555  LIPASE 25   No results for input(s): AMMONIA in the last 168 hours. CBC: Recent Labs  Lab 01/28/21 1851 01/29/21 0549  01/29/21 1357 01/30/21 0455 01/30/21 1651 01/31/21 0525 02/01/21 0519  WBC 10.0 9.5  --  9.7  --  10.4 8.8  NEUTROABS  --   --   --  8.4*  --  8.8* 6.8  HGB 7.4* 7.2* 7.8* 6.9* 8.4* 8.1* 7.8*  HCT 22.5* 21.1* 23.0* 20.9* 26.0* 23.6* 22.7*  MCV 72.3* 71.5*  --  72.1*  --  72.6* 72.5*  PLT 344 329  --  353  --  391 410*    CBG: Recent Labs  Lab 01/31/21 1153 01/31/21 1733 01/31/21 2126 02/01/21 0802 02/01/21 1205  GLUCAP 117* 117* 193* 155* 162*       Signed:  02/03/21 MD.  [facility] 02/01/2021, 3:19 PM

## 2021-02-01 NOTE — Progress Notes (Signed)
MD order to discharge.  Discharge instructions reviewed with pt, pt's husband, and pt's daughter Amy.  Clarified pt would be going home with foley catheter in place.   Amy aware to follow up with urology for appointment.   All verbalize understanding of discharge instructions.   Discharge via wheelchair accompanied by staff.

## 2021-02-01 NOTE — Progress Notes (Signed)
   02/01/21 1100  Mobility  Activity Sat and stood x 3  Level of Assistance Minimal assist, patient does 75% or more  Assistive Device Front wheel walker  Mobility  (Exercises)  Mobility Response Tolerated fair  Mobility performed by Mobility specialist  $Mobility charge 1 Mobility   Upon entering, NT was changing pt bed pad. Pt refused to ambulate in the hallway secondary to low back pain, but agreed to do exercises EOB. Conducted 1 rounds of 5x stand to sit exercises with RW. Pt needed verbal queues for hand placement on walker. Pt did state she felt lightheaded at the end of the exercise. Conducted no further exercises at this point. No other complaints. Left pt in bed with call bell at side, and daughter in room. Notified nurse of session.  Pt's daughter present during session. She requested MS or PT come in the afternoon to ambulate the pt.  Tim[employer]o [employer] Mobility Specialist Acute Rehab Services Office: [phone]

## 2021-02-03 ENCOUNTER — Telehealth: Payer: Self-pay | Admitting: Cardiovascular Disease

## 2021-02-03 LAB — CULTURE, BLOOD (ROUTINE X 2)
Culture: NO GROWTH
Culture: NO GROWTH
Special Requests: ADEQUATE
Special Requests: ADEQUATE

## 2021-02-03 NOTE — Telephone Encounter (Signed)
Returned call to Pt.    Got permission from Pt to speak with daughter.  Confirmed Pt is taking hydralazine 100 mg PO BID.  Pt has f/u scheduled with PCP to recheck labs s/p hospitalization.  Advised would forward to Dr. Gibson Ramp nurse to see if any sooner appointments/watch for cancellation.  Daughter thanked nurse for call back.  Would appreciate sooner appointment if able.

## 2021-02-03 NOTE — Telephone Encounter (Signed)
   Pt's daughter calling, she said pt was in the hospital recently and needs f/u with Dr. Clifton James, no sooner available appt, they ask what the pt can do with her BP meds while waiting for her appt

## 2021-02-13 ENCOUNTER — Encounter (HOSPITAL_BASED_OUTPATIENT_CLINIC_OR_DEPARTMENT_OTHER): Payer: Self-pay

## 2021-02-13 ENCOUNTER — Other Ambulatory Visit: Payer: Self-pay

## 2021-02-13 ENCOUNTER — Emergency Department (HOSPITAL_BASED_OUTPATIENT_CLINIC_OR_DEPARTMENT_OTHER)
Admission: EM | Admit: 2021-02-13 | Discharge: 2021-02-13 | Disposition: A | Discharge status: Home / Self Care | Payer: Federal, State, Local not specified - PPO | Attending: Emergency Medicine | Admitting: Emergency Medicine

## 2021-02-13 ENCOUNTER — Emergency Department (HOSPITAL_BASED_OUTPATIENT_CLINIC_OR_DEPARTMENT_OTHER): Payer: Federal, State, Local not specified - PPO

## 2021-02-13 DIAGNOSIS — E119 Type 2 diabetes mellitus without complications: Secondary | ICD-10-CM | POA: Insufficient documentation

## 2021-02-13 DIAGNOSIS — Z91040 Latex allergy status: Secondary | ICD-10-CM | POA: Diagnosis not present

## 2021-02-13 DIAGNOSIS — Z7982 Long term (current) use of aspirin: Secondary | ICD-10-CM | POA: Diagnosis not present

## 2021-02-13 DIAGNOSIS — Z96643 Presence of artificial hip joint, bilateral: Secondary | ICD-10-CM | POA: Diagnosis not present

## 2021-02-13 DIAGNOSIS — N3000 Acute cystitis without hematuria: Secondary | ICD-10-CM | POA: Insufficient documentation

## 2021-02-13 DIAGNOSIS — Z20822 Contact with and (suspected) exposure to covid-19: Secondary | ICD-10-CM | POA: Diagnosis not present

## 2021-02-13 DIAGNOSIS — R339 Retention of urine, unspecified: Secondary | ICD-10-CM

## 2021-02-13 DIAGNOSIS — Z91010 Allergy to peanuts: Secondary | ICD-10-CM | POA: Diagnosis not present

## 2021-02-13 DIAGNOSIS — E871 Hypo-osmolality and hyponatremia: Secondary | ICD-10-CM | POA: Insufficient documentation

## 2021-02-13 DIAGNOSIS — I2510 Atherosclerotic heart disease of native coronary artery without angina pectoris: Secondary | ICD-10-CM | POA: Insufficient documentation

## 2021-02-13 DIAGNOSIS — R531 Weakness: Secondary | ICD-10-CM | POA: Diagnosis not present

## 2021-02-13 DIAGNOSIS — R509 Fever, unspecified: Secondary | ICD-10-CM | POA: Diagnosis present

## 2021-02-13 DIAGNOSIS — Z79899 Other long term (current) drug therapy: Secondary | ICD-10-CM | POA: Diagnosis not present

## 2021-02-13 DIAGNOSIS — R109 Unspecified abdominal pain: Secondary | ICD-10-CM | POA: Insufficient documentation

## 2021-02-13 DIAGNOSIS — Z7984 Long term (current) use of oral hypoglycemic drugs: Secondary | ICD-10-CM | POA: Diagnosis not present

## 2021-02-13 LAB — MAGNESIUM: Magnesium: 1.5 mg/dL — ABNORMAL LOW (ref 1.7–2.4)

## 2021-02-13 LAB — CBC WITH DIFFERENTIAL/PLATELET
Abs Immature Granulocytes: 0.04 10*3/uL (ref 0.00–0.07)
Basophils Absolute: 0 10*3/uL (ref 0.0–0.1)
Basophils Relative: 0 %
Eosinophils Absolute: 0 10*3/uL (ref 0.0–0.5)
Eosinophils Relative: 0 %
HCT: 26.1 % — ABNORMAL LOW (ref 36.0–46.0)
Hemoglobin: 8.6 g/dL — ABNORMAL LOW (ref 12.0–15.0)
Immature Granulocytes: 1 %
Lymphocytes Relative: 15 %
Lymphs Abs: 1.2 10*3/uL (ref 0.7–4.0)
MCH: 25 pg — ABNORMAL LOW (ref 26.0–34.0)
MCHC: 33 g/dL (ref 30.0–36.0)
MCV: 75.9 fL — ABNORMAL LOW (ref 80.0–100.0)
Monocytes Absolute: 1 10*3/uL (ref 0.1–1.0)
Monocytes Relative: 12 %
Neutro Abs: 5.9 10*3/uL (ref 1.7–7.7)
Neutrophils Relative %: 72 %
Platelets: 539 10*3/uL — ABNORMAL HIGH (ref 150–400)
RBC: 3.44 MIL/uL — ABNORMAL LOW (ref 3.87–5.11)
RDW: 19.6 % — ABNORMAL HIGH (ref 11.5–15.5)
WBC: 8.2 10*3/uL (ref 4.0–10.5)
nRBC: 0 % (ref 0.0–0.2)

## 2021-02-13 LAB — URINALYSIS, ROUTINE W REFLEX MICROSCOPIC
Bilirubin Urine: NEGATIVE
Glucose, UA: NEGATIVE mg/dL
Hgb urine dipstick: NEGATIVE
Ketones, ur: NEGATIVE mg/dL
Nitrite: NEGATIVE
Protein, ur: 30 mg/dL — AB
Specific Gravity, Urine: 1.009 (ref 1.005–1.030)
WBC, UA: >50 WBC/hpf — ABNORMAL HIGH (ref 0–5)
pH: 6.5 (ref 5.0–8.0)

## 2021-02-13 LAB — COMPREHENSIVE METABOLIC PANEL
ALT: 12 U/L (ref 0–44)
AST: 14 U/L — ABNORMAL LOW (ref 15–41)
Albumin: 3.6 g/dL (ref 3.5–5.0)
Alkaline Phosphatase: 71 U/L (ref 38–126)
Anion gap: 14 (ref 5–15)
BUN: 16 mg/dL (ref 8–23)
CO2: 19 mmol/L — ABNORMAL LOW (ref 22–32)
Calcium: 9.3 mg/dL (ref 8.9–10.3)
Chloride: 91 mmol/L — ABNORMAL LOW (ref 98–111)
Creatinine, Ser: 0.91 mg/dL (ref 0.44–1.00)
GFR, Estimated: >60 mL/min (ref >60)
Glucose, Bld: 140 mg/dL — ABNORMAL HIGH (ref 70–99)
Potassium: 3.7 mmol/L (ref 3.5–5.1)
Sodium: 124 mmol/L — ABNORMAL LOW (ref 135–145)
Total Bilirubin: 0.5 mg/dL (ref 0.3–1.2)
Total Protein: 6.9 g/dL (ref 6.5–8.1)

## 2021-02-13 LAB — LACTIC ACID, PLASMA: Lactic Acid, Venous: 1.4 mmol/L (ref 0.5–1.9)

## 2021-02-13 LAB — APTT: aPTT: 35 seconds (ref 24–36)

## 2021-02-13 LAB — PROTIME-INR
INR: 1.1 (ref 0.8–1.2)
Prothrombin Time: 14.5 seconds (ref 11.4–15.2)

## 2021-02-13 LAB — RESP PANEL BY RT-PCR (FLU A&B, COVID) ARPGX2
Influenza A by PCR: NEGATIVE
Influenza B by PCR: NEGATIVE
SARS Coronavirus 2 by RT PCR: NEGATIVE

## 2021-02-13 MED ORDER — ACETAMINOPHEN 325 MG PO TABS
650.0000 mg | ORAL_TABLET | Freq: Once | ORAL | Status: AC
  Administered 2021-02-13: 650 mg via ORAL
  Filled 2021-02-13: qty 2

## 2021-02-13 MED ORDER — LIDOCAINE HCL URETHRAL/MUCOSAL 2 % EX GEL
1.0000 "application " | Freq: Once | CUTANEOUS | Status: AC
  Administered 2021-02-13: 1 via URETHRAL
  Filled 2021-02-13: qty 11

## 2021-02-13 MED ORDER — AMOXICILLIN 500 MG PO CAPS
500.0000 mg | ORAL_CAPSULE | Freq: Once | ORAL | Status: AC
  Administered 2021-02-13: 500 mg via ORAL
  Filled 2021-02-13: qty 1

## 2021-02-13 MED ORDER — LACTATED RINGERS IV BOLUS
1000.0000 mL | Freq: Once | INTRAVENOUS | Status: AC
  Administered 2021-02-13: 1000 mL via INTRAVENOUS

## 2021-02-13 MED ORDER — AMOXICILLIN 500 MG PO CAPS: 500.0000 mg | ORAL_CAPSULE | Freq: Three times a day (TID) | ORAL | 0 refills | Status: AC

## 2021-02-13 NOTE — ED Provider Notes (Signed)
[facility] EMERGENCY DEPARTMENT Provider Note  CSN: [id number] Arrival date & time: 02/13/21 1707    History Chief Complaint  Patient presents with   Fever   Weakness    Shirley Juarez is a 70 y.o. female here for evaluation of fever and weakness. She was at the [facility] on 8/11 for a urethral stricture and urinary retention. She had to have urology do a dilation and foley placement in the ED. She went home the following day. Foley was removed as an outpatient. She returned 8/27 with general weakness and fever. She was found to have chronic anemia, hypokalemia and hyponatremia. Urine culture grew enterococcus, treated with Amoxil for 7 days at discharge on 9/2. She had her catheter replaced while in the hospital and removed as an outpatient about 10 days ago. She had been doing well until the last few days, she reports poor PO intake, general weakness and fever at home today. No cough, SOB, N/V/D or dysuria.    Past Medical History:  Diagnosis Date   Anxiety    Arthritis    Osteoarthritis- knee and back   Coronary artery disease    Cath 2004 in [place], [place] with minor CAD   Depression    Diabetes mellitus    On oral and diet   GERD (gastroesophageal reflux disease)    Hyperlipidemia    Hypertension    Sleep apnea    does not use CPAP- it is broken.  Last sleep study was 7 years ago.  Has not worn in  4  years    Past Surgical History:  Procedure Laterality Date   ABDOMINAL HYSTERECTOMY     KNEE ARTHROCENTESIS     TOTAL KNEE ARTHROPLASTY  06/22/2011   Bilateral     Family History  Problem Relation Age of Onset   Heart attack Mother 69   Liver disease Father    Heart attack Son        Age 64, he is our patient   Anesthesia problems Neg Hx     Social History   Tobacco Use   Smoking status: Never   Smokeless tobacco: Never  Vaping Use   Vaping Use: Never used  Substance Use Topics   Alcohol use: No   Drug use: No     Home Medications Prior to  Admission medications   Medication Sig Start Date End Date Taking? Authorizing Provider  amoxicillin (AMOXIL) 500 MG capsule Take 1 capsule (500 mg total) by mouth 3 (three) times daily for 7 days. 02/13/21 02/20/21 Yes Pollyann Savoy, MD  acetaminophen (TYLENOL) 325 MG tablet Take 325-650 mg by mouth every 6 (six) hours as needed for mild pain or headache.    [provider]  ALPRAZolam Prudy Feeler) 0.5 MG tablet Take 0.5 mg by mouth in the morning and at bedtime.    [provider]  aspirin EC 81 MG tablet Take 81 mg by mouth at bedtime.      [provider]  Calcium Citrate-Vitamin D (CALCIUM CITRATE + D3 PO) Take 1 tablet by mouth every Monday, Wednesday, and Friday.    [provider]  estradiol (ESTRACE) 2 MG tablet Take 1 mg by mouth 2 (two) times a week.    [provider]  ferrous gluconate (FERGON) 324 MG tablet Take 1 tablet (324 mg total) by mouth daily with breakfast. 02/01/21   Meredeth Ide, MD  gemfibrozil (LOPID) 600 MG tablet Take 600 mg by mouth 2 (two) times daily  before a meal.     [provider]  hydrALAZINE (APRESOLINE) 100 MG tablet Take 1 tablet (100 mg total) by mouth in the morning and at bedtime. 01/13/21   Joseph Art, DO  hydrOXYzine (ATARAX/VISTARIL) 25 MG tablet Take 25 mg by mouth at bedtime.      [provider]  lansoprazole (PREVACID) 30 MG capsule Take 30 mg by mouth daily before breakfast.    [provider]  metFORMIN (GLUCOPHAGE) 500 MG tablet Take 1,000 mg by mouth daily with breakfast.    [provider]  ondansetron (ZOFRAN) 8 MG tablet Take 8 mg by mouth daily as needed for nausea or vomiting.    [provider]  tamsulosin (FLOMAX) 0.4 MG CAPS capsule Take 1 capsule (0.4 mg total) by mouth daily after supper. 02/01/21   Meredeth Ide, MD  venlafaxine XR (EFFEXOR-XR) 150 MG 24 hr capsule Take 150 mg by mouth at bedtime.    [provider]     Allergies     Latex, Codeine, Crestor [rosuvastatin calcium], Fish oil, Hydralazine, Lipitor [atorvastatin calcium], Peanut-containing drug products, Statins, Welchol [colesevelam hcl], Zithromax [azithromycin], and Other   Review of Systems   Review of Systems A comprehensive review of systems was completed and negative except as noted in HPI.    Physical Exam BP (!) 151/60 (BP Location: Right Arm)   Pulse 80   Temp 98.5 F (36.9 C) (Oral)   Resp 16   Ht 5' (1.524 m)   Wt 59.9 kg   SpO2 96%   BMI 25.78 kg/m   Physical Exam Vitals and nursing note reviewed.  Constitutional:      Appearance: Normal appearance.  HENT:     Head: Normocephalic and atraumatic.     Nose: Nose normal.     Mouth/Throat:     Mouth: Mucous membranes are moist.  Eyes:     Extraocular Movements: Extraocular movements intact.     Conjunctiva/sclera: Conjunctivae normal.  Cardiovascular:     Rate and Rhythm: Tachycardia present.     Heart sounds: Murmur heard.  Pulmonary:     Effort: Pulmonary effort is normal.     Breath sounds: Normal breath sounds.  Abdominal:     General: Abdomen is flat.     Palpations: Abdomen is soft.     Tenderness: There is no abdominal tenderness.  Musculoskeletal:        General: No swelling. Normal range of motion.     Cervical back: Neck supple.  Skin:    General: Skin is warm and dry.  Neurological:     General: No focal deficit present.     Mental Status: She is alert.  Psychiatric:        Mood and Affect: Mood normal.     ED Results / Procedures / Treatments   Labs (all labs ordered are listed, but only abnormal results are displayed) Labs Reviewed  COMPREHENSIVE METABOLIC PANEL - Abnormal; Notable for the following components:      Result Value   Sodium 124 (*)    Chloride 91 (*)    CO2 19 (*)    Glucose, Bld 140 (*)    AST 14 (*)    All other components within normal limits  CBC WITH DIFFERENTIAL/PLATELET - Abnormal; Notable for the following components:    RBC 3.44 (*)    Hemoglobin 8.6 (*)    HCT 26.1 (*)    MCV 75.9 (*)    MCH 25.0 (*)  RDW 19.6 (*)    Platelets 539 (*)    All other components within normal limits  URINALYSIS, ROUTINE W REFLEX MICROSCOPIC - Abnormal; Notable for the following components:   APPearance HAZY (*)    Protein, ur 30 (*)    Leukocytes,Ua LARGE (*)    WBC, UA >50 (*)    Bacteria, UA RARE (*)    All other components within normal limits  MAGNESIUM - Abnormal; Notable for the following components:   Magnesium 1.5 (*)    All other components within normal limits  RESP PANEL BY RT-PCR (FLU A&B, COVID) ARPGX2  URINE CULTURE  CULTURE, BLOOD (ROUTINE X 2)  CULTURE, BLOOD (ROUTINE X 2)  LACTIC ACID, PLASMA  PROTIME-INR  APTT  LACTIC ACID, PLASMA    EKG None   Radiology DG Chest Port 1 View  Result Date: 02/13/2021 CLINICAL DATA:  Fever, fatigue EXAM: PORTABLE CHEST 1 VIEW COMPARISON:  01/28/2021 FINDINGS: The heart size and mediastinal contours are within normal limits. Both lungs are clear. The visualized skeletal structures are unremarkable. IMPRESSION: No acute abnormality of the lungs in AP portable projection. Electronically Signed   By: Lauralyn PrimesAlex  Bibbey M.D.   On: 02/13/2021 18:43    Procedures Procedures  Medications Ordered in the ED Medications  amoxicillin (AMOXIL) capsule 500 mg (has no administration in time range)  acetaminophen (TYLENOL) tablet 650 mg (650 mg Oral Given 02/13/21 1821)  lactated ringers bolus 1,000 mL (0 mLs Intravenous Stopped 02/13/21 2118)  lidocaine (XYLOCAINE) 2 % jelly 1 application (1 application Urethral Given 02/13/21 2120)     MDM Rules/Calculators/A&P MDM Patient with fever of unknown origin, recently had foley catheter in for a few weeks so this could be source. Will also check labs, CXR and Covid swab. LR bolus and APAP for fever and tachycardia.   ED Course  I have reviewed the triage vital signs and the nursing notes.  Pertinent labs & imaging results  that were available during my care of the patient were reviewed by me and considered in my medical decision making (see chart for details).  Clinical Course as of 02/13/21 2218  Mon Feb 13, 2021  1902 CBC with normal WBC, Anemia is about at baseline. CMP with mild hyponatremia. K is normal, CR is improved.  [CS]  1902 Covid/Flu negative; coags, lactic acid normal.  [CS]  1903 CXR is clear.  [CS]  1953 Magnesium is borderline low.  [CS]  2129 Patient has not been able to urinate. Has 600cc on bladder scan, given recent concerns for urethral stricture and bladder outlet obstruction, RN will attempt foley placement.  [CS]  2156 Temp, HR improved. UA is now pending.  [CS]  2208 UA concerning for recurrence of UTI, will treat based on previous culture.  [CS]  2216 Discussed results with patient and husband at bedside. She is adamant that he wants to go home. She is familiar with care of a foley catheter from recent issues and has the supplies she needs at home. She already has urology follow up scheduled for Thursday this week. Rx for Amoxil based on recent culture. Resent today. Advised to return for any worsening fever, vomiting, severe back pain or any other concerns [CS]    Clinical Course User Index [CS] Pollyann SavoySheldon, Yahye Siebert B, MD    Final Clinical Impression(s) / ED Diagnoses Final diagnoses:  Acute cystitis without hematuria  Urinary retention  Hyponatremia    Rx / DC Orders ED Discharge Orders  Ordered    amoxicillin (AMOXIL) 500 MG capsule  3 times daily        02/13/21 2216             Pollyann Savoy, MD 02/13/21 2218

## 2021-02-13 NOTE — ED Notes (Signed)
This RN presented the AVS utilizing Teachback Method. Patient verbalizes understanding of Discharge Instructions. Opportunity for Questioning and Answers were provided. Patient Discharged from ED in Wheelchair to Home with Significant Other.  

## 2021-02-13 NOTE — ED Notes (Signed)
Patient opted to keep Urinary Collection Bag as oppose to having Urinary Leg Bag placed.

## 2021-02-13 NOTE — ED Triage Notes (Signed)
Pt arrives POV with her daughter.  Reports fever, generalized fatigue for a couple of days.  Per daughter, had a fever earlier today.  Had a urology procedure on 8/11 and was then hospitalized due to severe UTI.  Pt denies any new urinary issues.

## 2021-02-13 NOTE — ED Notes (Signed)
14G Foley Catheter Inserted by Kenney Houseman, EMT with assistance from this RN. Difficulty during first 2 insertion attempts but very minimal resistance on Third Insterion. Balloon inflated with 10 mL of Saline. Foley Catheter secured.

## 2021-02-16 LAB — URINE CULTURE: Culture: >=100000 — AB

## 2021-02-17 ENCOUNTER — Telehealth: Payer: Self-pay | Admitting: Emergency Medicine

## 2021-02-17 NOTE — Telephone Encounter (Unsigned)
Post ED Visit - Positive Culture Follow-up: Unsuccessful Patient Follow-up  Culture assessed and recommendations reviewed by:  []  , Pharm.D. []  Enzo Bi, Pharm.D., BCPS AQ-ID []  , Pharm.D., BCPS []  Celedonio Miyamoto, Pharm.D., BCPS []  [place], Garvin Fila.D., BCPS, AAHIVP []  , Pharm.D., BCPS, AAHIVP [x]  [place]ina Pillion, PharmD []  , PharmD, BCPS  Positive urine culture  []  Patient discharged without antimicrobial prescription and treatment is now indicated []  Organism is resistant to prescribed ED discharge antimicrobial []  Patient with positive blood cultures   Unable to contact patient at phone number on file, letter will be sent to address on file.  Plan: Call patient and assess for any urinary symptoms, if no symptoms then no antibotics needed. If having symptoms then prescribe Ciprofloxacin Po 500 mg BID for seven days - [place], MD  [place] 02/17/2021, 2:48 PM

## 2021-02-18 LAB — CULTURE, BLOOD (ROUTINE X 2)
Culture: NO GROWTH
Culture: NO GROWTH
Special Requests: ADEQUATE
Special Requests: ADEQUATE

## 2021-02-23 NOTE — Telephone Encounter (Signed)
Spoke with patient and offered appointment in cancellation spot for 11:00 am today.  She is unable to make this appointment.  She has follow up with urology and PA w PCP next week.  She will stay on wait list and keep appointment in December.  Pt thanked me for call.

## 2021-03-06 ENCOUNTER — Encounter: Payer: Self-pay | Admitting: Gastroenterology

## 2021-03-10 ENCOUNTER — Encounter: Payer: Self-pay | Admitting: Cardiovascular Disease

## 2021-03-10 ENCOUNTER — Other Ambulatory Visit: Payer: Self-pay

## 2021-03-10 ENCOUNTER — Ambulatory Visit: Payer: Federal, State, Local not specified - PPO | Admitting: Cardiovascular Disease

## 2021-03-10 VITALS — BP 112/62 | HR 97 | Ht 60.0 in | Wt 134.8 lb

## 2021-03-10 DIAGNOSIS — I10 Essential (primary) hypertension: Secondary | ICD-10-CM | POA: Diagnosis not present

## 2021-03-10 DIAGNOSIS — I2510 Atherosclerotic heart disease of native coronary artery without angina pectoris: Secondary | ICD-10-CM

## 2021-03-10 NOTE — Patient Instructions (Signed)

## 2021-03-10 NOTE — Progress Notes (Signed)
Chief Complaint  Patient presents with   Follow-up    CAD    History of Present Illness: 70 yo female with history of HTN, HLD, CAD, DM, anxiety and depression who is here today for cardiac follow up. I saw her 01/31/16 to re-establish care in our office. She was known to have minor CAD by cath in 2004 in [place]. Her stress test in 2014 showed no ischemia. Echo in 2014 with normal LV function, mild AI. When I saw her August 2017, she had many complaints including headache, posterior neck pain, arm pain, chest pain at rest, elevated BP at home. She was admitted to [facility] 02/06/16 with neck pain radiating to her chest. Neck MRI with cervical spine disease. Nuclear stress test 02/07/16 with no ischemia. Echo 02/07/16 with normal LV size and function, mild AI. She does not tolerate Calcium channel blockers, Toprol, clonidine, Norvasc. Cardiac monitor June 2019 with several short runs of atrial tachycardia. Echo 03/02/19 with LVEF=65-70%, mild MR.   She is here today for follow up. The patient denies any dyspnea, palpitations, lower extremity edema, orthopnea, PND, dizziness, near syncope or syncope. Rare resting chest pain. She has had urological procedures since August 2022 and is still anemic.   Primary Care Physician: Carolin Coy, MD  Past Medical History:  Diagnosis Date   Anxiety    Arthritis    Osteoarthritis- knee and back   Coronary artery disease    Cath 2004 in [place], [place] with minor CAD   Depression    Diabetes mellitus    On oral and diet   GERD (gastroesophageal reflux disease)    Hyperlipidemia    Hypertension    Sleep apnea    does not use CPAP- it is broken.  Last sleep study was 7 years ago.  Has not worn in  4  years    Past Surgical History:  Procedure Laterality Date   ABDOMINAL HYSTERECTOMY     KNEE ARTHROCENTESIS     TOTAL KNEE ARTHROPLASTY  06/22/2011   Bilateral     Current Outpatient Medications  Medication Sig Dispense Refill   acetaminophen  (TYLENOL) 325 MG tablet Take 325-650 mg by mouth every 6 (six) hours as needed for mild pain or headache.     ALPRAZolam (XANAX) 0.5 MG tablet Take 0.5 mg by mouth in the morning and at bedtime.     aspirin EC 81 MG tablet Take 81 mg by mouth at bedtime.       Calcium Citrate-Vitamin D (CALCIUM CITRATE + D3 PO) Take 1 tablet by mouth every Monday, Wednesday, and Friday.     estradiol (ESTRACE) 2 MG tablet Take 1 mg by mouth 2 (two) times a week.     ferrous gluconate (FERGON) 324 MG tablet Take 1 tablet (324 mg total) by mouth daily with breakfast. 30 tablet 3   gemfibrozil (LOPID) 600 MG tablet Take 600 mg by mouth 2 (two) times daily before a meal.      hydrALAZINE (APRESOLINE) 100 MG tablet Take 1 tablet (100 mg total) by mouth in the morning and at bedtime.     hydrOXYzine (ATARAX/VISTARIL) 25 MG tablet Take 25 mg by mouth at bedtime.       lansoprazole (PREVACID) 30 MG capsule Take 30 mg by mouth daily before breakfast.     metFORMIN (GLUCOPHAGE) 500 MG tablet Take 1,000 mg by mouth daily with breakfast.     ondansetron (ZOFRAN) 8 MG tablet Take 8 mg by mouth daily  as needed for nausea or vomiting.     tamsulosin (FLOMAX) 0.4 MG CAPS capsule Take 1 capsule (0.4 mg total) by mouth daily after supper. 30 capsule 2   venlafaxine XR (EFFEXOR-XR) 150 MG 24 hr capsule Take 150 mg by mouth at bedtime.     No current facility-administered medications for this visit.    Allergies  Allergen Reactions   Latex Rash   Codeine Nausea And Vomiting   Crestor [Rosuvastatin Calcium] Nausea And Vomiting   Fish Oil Nausea And Vomiting   Hydralazine Other (See Comments) and Cough    100 mg three times a day causes excessive coughing   Lipitor [Atorvastatin Calcium] Nausea And Vomiting   Peanut-Containing Drug Products Hives   Statins Nausea And Vomiting   Welchol [Colesevelam Hcl] Nausea And Vomiting   Zithromax [Azithromycin] Nausea And Vomiting   Other Other (See Comments)    Pet dander     Social History   Socioeconomic History   Marital status: Married    Spouse name: Not on file   Number of children: 2   Years of education: Not on file   Highest education level: Not on file  Occupational History   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  Tobacco Use   Smoking status: Never   Smokeless tobacco: Never  Vaping Use   Vaping Use: Never used  Substance and Sexual Activity   Alcohol use: No   Drug use: No   Sexual activity: Yes    Birth control/protection: Surgical  Other Topics Concern   Not on file  Social History Narrative   Not on file   Social Determinants of Health   Financial Resource Strain: Not on file  Food Insecurity: Not on file  Transportation Needs: Not on file  Physical Activity: Not on file  Stress: Not on file  Social Connections: Not on file  Intimate Partner Violence: Not on file    Family History  Problem Relation Age of Onset   Heart attack Mother 30   Liver disease Father    Heart attack Son        Age 62, he is our patient   Anesthesia problems Neg Hx     Review of Systems:  As stated in the HPI and otherwise negative.   BP 112/62   Pulse 97   Ht 5' (1.524 m)   Wt 134 lb 12.8 oz (61.1 kg)   SpO2 98%   BMI 26.33 kg/m   Physical Examination:  General: Well developed, well nourished, NAD  HEENT: OP clear, mucus membranes moist  SKIN: warm, dry. No rashes. Neuro: No focal deficits  Musculoskeletal: Muscle strength 5/5 all ext  Psychiatric: Mood and affect normal  Neck: No JVD, no carotid bruits, no thyromegaly, no lymphadenopathy.  Lungs:Clear bilaterally, no wheezes, rhonci, crackles Cardiovascular: Regular rate and rhythm. No murmurs, gallops or rubs. Abdomen:Soft. Bowel sounds present. Non-tender.  Extremities: No lower extremity edema. Pulses are 2 + in the bilateral DP/PT.  Echo 03/02/19:   1. Left ventricular ejection fraction, by visual estimation, is 65 to  70%. The left ventricle has normal function. Normal  left ventricular size.  There is no left ventricular hypertrophy.   2. Global right ventricle has normal systolic function.The right  ventricular size is normal. No increase in right ventricular wall  thickness.   3. The mitral valve is normal in structure. Mild mitral valve  regurgitation.   EKG:  EKG is not ordered today. The ekg ordered today demonstrates  Recent Labs: 01/30/2021: TSH 0.629 02/13/2021: ALT 12; BUN 16; Creatinine, Ser 0.91; Hemoglobin 8.6; Magnesium 1.5; Platelets 539; Potassium 3.7; Sodium 124   Lipid Panel No results found for: CHOL, TRIG, HDL, CHOLHDL, VLDL, LDLCALC, LDLDIRECT   Wt Readings from Last 3 Encounters:  03/10/21 134 lb 12.8 oz (61.1 kg)  02/13/21 132 lb (59.9 kg)  02/01/21 160 lb 4.4 oz (72.7 kg)     Other studies Reviewed: Additional studies/ records that were reviewed today include: . Review of the above records demonstrates:   Assessment and Plan:   1. CAD without angina:  She is known to have mild CAD by cath in 2004. Nuclear stress test September 2017 with no ischemia. Echo September 2020 with normal LV function. No chest pain suggestive of angina. Continue ASA. She does not tolerate statins.    2. Aortic insufficiency/mitral regurgitation: No significant AI by echo September 2020. Mild MR by echo in 2020.   3. HTN: BP is well controlled. No changes today  4. History of Atrial tachycardia: No palpitations  Current medicines are reviewed at length with the patient today.  The patient does not have concerns regarding medicines.  The following changes have been made:  no change  Labs/ tests ordered today include:   No orders of the defined types were placed in this encounter.  Disposition:   F/U with me in 12 months  Signed, Verne Carrow, MD 03/10/2021 3:09 PM    [facility] [address] [place], [place], [place]  [address] Phone: [phone]; Fax: [phone]

## 2021-03-15 ENCOUNTER — Telehealth: Payer: Self-pay | Admitting: *Deleted

## 2021-03-15 ENCOUNTER — Ambulatory Visit (AMBULATORY_SURGERY_CENTER): Payer: Self-pay | Admitting: *Deleted

## 2021-03-15 ENCOUNTER — Other Ambulatory Visit: Payer: Self-pay

## 2021-03-15 VITALS — Ht 60.0 in | Wt 136.0 lb

## 2021-03-15 DIAGNOSIS — Z1211 Encounter for screening for malignant neoplasm of colon: Secondary | ICD-10-CM

## 2021-03-15 MED ORDER — ONDANSETRON HCL 4 MG PO TABS: 4.0000 mg | ORAL_TABLET | ORAL | 0 refills | Status: AC

## 2021-03-15 NOTE — Progress Notes (Signed)

## 2021-03-15 NOTE — Telephone Encounter (Signed)
Dr.Mansouraty,  Please review this patient. She has had several ED visit since 8/11, she has been anemic and had a blood infusion during hospital admission in August. She denies any GI concerns, no blood in stool and no black stools. She wants to know if she can have the EGD with the colonoscopy due to anemia? Please advise. Thank you, Tallen Schnorr pv

## 2021-03-16 NOTE — Telephone Encounter (Signed)
Scheduled ECL 11-8 at 3 pm with pt- new instructions to her My Chart and also mailed to her

## 2021-03-16 NOTE — Telephone Encounter (Signed)
Called pt- Lm with husband  to have pt call me at [phone]

## 2021-03-16 NOTE — Telephone Encounter (Signed)
Agree with further evaluation for iron deficiency anemia. Add on upper endoscopy to planned colonoscopy. If negative upper and lower will require video capsule endoscopy.   Diagnosis iron deficiency anemia and colon cancer screening. Please move forward with scheduling both procedures on the same date. I am okay to add on the procedure for that particular date. Thanks. GM

## 2021-03-21 ENCOUNTER — Encounter: Payer: Federal, State, Local not specified - PPO | Admitting: Gastroenterology

## 2021-04-11 ENCOUNTER — Encounter: Payer: Self-pay | Admitting: Gastroenterology

## 2021-04-11 ENCOUNTER — Other Ambulatory Visit: Payer: Self-pay

## 2021-04-11 ENCOUNTER — Other Ambulatory Visit (INDEPENDENT_AMBULATORY_CARE_PROVIDER_SITE_OTHER): Payer: Federal, State, Local not specified - PPO

## 2021-04-11 ENCOUNTER — Ambulatory Visit (AMBULATORY_SURGERY_CENTER): Payer: Federal, State, Local not specified - PPO | Admitting: Gastroenterology

## 2021-04-11 VITALS — BP 163/69 | HR 75 | Temp 98.0°F | Resp 13 | Ht 60.0 in | Wt 136.0 lb

## 2021-04-11 DIAGNOSIS — K5730 Diverticulosis of large intestine without perforation or abscess without bleeding: Secondary | ICD-10-CM

## 2021-04-11 DIAGNOSIS — D509 Iron deficiency anemia, unspecified: Secondary | ICD-10-CM | POA: Diagnosis not present

## 2021-04-11 DIAGNOSIS — Z1211 Encounter for screening for malignant neoplasm of colon: Secondary | ICD-10-CM

## 2021-04-11 DIAGNOSIS — K3189 Other diseases of stomach and duodenum: Secondary | ICD-10-CM | POA: Diagnosis not present

## 2021-04-11 DIAGNOSIS — K641 Second degree hemorrhoids: Secondary | ICD-10-CM

## 2021-04-11 DIAGNOSIS — K317 Polyp of stomach and duodenum: Secondary | ICD-10-CM | POA: Diagnosis not present

## 2021-04-11 LAB — IBC + FERRITIN
Ferritin: 16.8 ng/mL (ref 10.0–291.0)
Iron: 28 ug/dL — ABNORMAL LOW (ref 42–145)
Saturation Ratios: 5.7 % — ABNORMAL LOW (ref 20.0–50.0)
TIBC: 492.8 ug/dL — ABNORMAL HIGH (ref 250.0–450.0)
Transferrin: 352 mg/dL (ref 212.0–360.0)

## 2021-04-11 LAB — CBC
HCT: 30.4 % — ABNORMAL LOW (ref 36.0–46.0)
Hemoglobin: 9.9 g/dL — ABNORMAL LOW (ref 12.0–15.0)
MCHC: 32.5 g/dL (ref 30.0–36.0)
MCV: 78 fl (ref 78.0–100.0)
Platelets: 348 10*3/uL (ref 150.0–400.0)
RBC: 3.89 Mil/uL (ref 3.87–5.11)
RDW: 16.8 % — ABNORMAL HIGH (ref 11.5–15.5)
WBC: 3.4 10*3/uL — ABNORMAL LOW (ref 4.0–10.5)

## 2021-04-11 MED ORDER — SODIUM CHLORIDE 0.9 % IV SOLN: 500.0000 mL | Freq: Once | INTRAVENOUS | Status: DC

## 2021-04-11 MED ORDER — LANSOPRAZOLE 30 MG PO CPDR: 30.0000 mg | DELAYED_RELEASE_CAPSULE | Freq: Two times a day (BID) | ORAL | 4 refills | Status: DC

## 2021-04-11 NOTE — Progress Notes (Signed)
GASTROENTEROLOGY PROCEDURE H&P NOTE   Primary Care Physician: Carolin Coy, MD  HPI: Shirley Juarez is a 70 y.o. female who presents for EGD/Colonoscopy for evaluation of IDA and Colon Cancer Screening.  Past Medical History:  Diagnosis Date   Anxiety    Arthritis    Osteoarthritis- knee and back   Coronary artery disease    Cath 2004 in [place], [place] with minor CAD   Depression    Diabetes mellitus    On oral and diet   GERD (gastroesophageal reflux disease)    Heart murmur    Hyperlipidemia    Hypertension    Sleep apnea    does not use CPAP- it is broken.  Last sleep study was 7 years ago.  Has not worn in  4  years   Past Surgical History:  Procedure Laterality Date   ABDOMINAL HYSTERECTOMY     COLONOSCOPY     Dr.Mann   KNEE ARTHROCENTESIS     TOTAL KNEE ARTHROPLASTY  06/22/2011   Bilateral    Current Outpatient Medications  Medication Sig Dispense Refill   acetaminophen (TYLENOL) 325 MG tablet Take 325-650 mg by mouth every 6 (six) hours as needed for mild pain or headache.     ALPRAZolam (XANAX) 0.5 MG tablet Take 0.5 mg by mouth in the morning and at bedtime.     aspirin EC 81 MG tablet Take 81 mg by mouth at bedtime.       Calcium Citrate-Vitamin D (CALCIUM CITRATE + D3 PO) Take 1 tablet by mouth every Monday, Wednesday, and Friday.     docusate sodium (COLACE) 100 MG capsule Take 100 mg by mouth daily.     estradiol (ESTRACE) 2 MG tablet Take 1 mg by mouth 2 (two) times a week.     ferrous gluconate (FERGON) 324 MG tablet Take 1 tablet (324 mg total) by mouth daily with breakfast. 30 tablet 3   gemfibrozil (LOPID) 600 MG tablet Take 600 mg by mouth 2 (two) times daily before a meal.      hydrALAZINE (APRESOLINE) 100 MG tablet Take 1 tablet (100 mg total) by mouth in the morning and at bedtime.     hydrOXYzine (ATARAX/VISTARIL) 25 MG tablet Take 25 mg by mouth at bedtime.       lansoprazole (PREVACID) 30 MG capsule Take 30 mg by mouth daily before  breakfast.     metFORMIN (GLUCOPHAGE) 500 MG tablet Take 1,000 mg by mouth daily with breakfast.     ondansetron (ZOFRAN) 4 MG tablet Take 1 tablet (4 mg total) by mouth as directed. Take one Zofran pill 30-60 minutes before each colonoscopy prep dose 2 tablet 0   tamsulosin (FLOMAX) 0.4 MG CAPS capsule Take 1 capsule (0.4 mg total) by mouth daily after supper. 30 capsule 2   venlafaxine XR (EFFEXOR-XR) 150 MG 24 hr capsule Take 150 mg by mouth at bedtime.     No current facility-administered medications for this visit.    Current Outpatient Medications:    acetaminophen (TYLENOL) 325 MG tablet, Take 325-650 mg by mouth every 6 (six) hours as needed for mild pain or headache., Disp: , Rfl:    ALPRAZolam (XANAX) 0.5 MG tablet, Take 0.5 mg by mouth in the morning and at bedtime., Disp: , Rfl:    aspirin EC 81 MG tablet, Take 81 mg by mouth at bedtime.  , Disp: , Rfl:    Calcium Citrate-Vitamin D (CALCIUM CITRATE + D3 PO), Take 1 tablet by mouth  every Monday, Wednesday, and Friday., Disp: , Rfl:    docusate sodium (COLACE) 100 MG capsule, Take 100 mg by mouth daily., Disp: , Rfl:    estradiol (ESTRACE) 2 MG tablet, Take 1 mg by mouth 2 (two) times a week., Disp: , Rfl:    ferrous gluconate (FERGON) 324 MG tablet, Take 1 tablet (324 mg total) by mouth daily with breakfast., Disp: 30 tablet, Rfl: 3   gemfibrozil (LOPID) 600 MG tablet, Take 600 mg by mouth 2 (two) times daily before a meal. , Disp: , Rfl:    hydrALAZINE (APRESOLINE) 100 MG tablet, Take 1 tablet (100 mg total) by mouth in the morning and at bedtime., Disp: , Rfl:    hydrOXYzine (ATARAX/VISTARIL) 25 MG tablet, Take 25 mg by mouth at bedtime.  , Disp: , Rfl:    lansoprazole (PREVACID) 30 MG capsule, Take 30 mg by mouth daily before breakfast., Disp: , Rfl:    metFORMIN (GLUCOPHAGE) 500 MG tablet, Take 1,000 mg by mouth daily with breakfast., Disp: , Rfl:    ondansetron (ZOFRAN) 4 MG tablet, Take 1 tablet (4 mg total) by mouth as  directed. Take one Zofran pill 30-60 minutes before each colonoscopy prep dose, Disp: 2 tablet, Rfl: 0   tamsulosin (FLOMAX) 0.4 MG CAPS capsule, Take 1 capsule (0.4 mg total) by mouth daily after supper., Disp: 30 capsule, Rfl: 2   venlafaxine XR (EFFEXOR-XR) 150 MG 24 hr capsule, Take 150 mg by mouth at bedtime., Disp: , Rfl:  Allergies  Allergen Reactions   Latex Rash   Codeine Nausea And Vomiting   Crestor [Rosuvastatin Calcium] Nausea And Vomiting   Fish Oil Nausea And Vomiting   Hydralazine Other (See Comments) and Cough    100 mg three times a day causes excessive coughing   Lipitor [Atorvastatin Calcium] Nausea And Vomiting   Peanut-Containing Drug Products Hives   Statins Nausea And Vomiting   Welchol [Colesevelam Hcl] Nausea And Vomiting   Zithromax [Azithromycin] Nausea And Vomiting   Other Other (See Comments)    Pet dander   Family History  Problem Relation Age of Onset   Heart attack Mother 68   Liver disease Father    Heart attack Son        Age 78, he is our patient   Anesthesia problems Neg Hx    Colon cancer Neg Hx    Esophageal cancer Neg Hx    Stomach cancer Neg Hx    Social History   Socioeconomic History   Marital status: Married    Spouse name: Not on file   Number of children: 2   Years of education: Not on file   Highest education level: Not on file  Occupational History   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  Tobacco Use   Smoking status: Never   Smokeless tobacco: Never  Vaping Use   Vaping Use: Never used  Substance and Sexual Activity   Alcohol use: No   Drug use: Not Currently   Sexual activity: Yes    Birth control/protection: Surgical  Other Topics Concern   Not on file  Social History Narrative   Not on file   Social Determinants of Health   Financial Resource Strain: Not on file  Food Insecurity: Not on file  Transportation Needs: Not on file  Physical Activity: Not on file  Stress: Not on file  Social Connections: Not on  file  Intimate Partner Violence: Not on file    Physical Exam: There were no vitals  filed for this visit. There is no height or weight on file to calculate BMI. GEN: NAD EYE: Sclerae anicteric ENT: MMM CV: Non-tachycardic GI: Soft, NT/ND NEURO:  Alert & Oriented x 3  Lab Results: No results for input(s): WBC, HGB, HCT, PLT in the last 72 hours. BMET No results for input(s): NA, K, CL, CO2, GLUCOSE, BUN, CREATININE, CALCIUM in the last 72 hours. LFT No results for input(s): PROT, ALBUMIN, AST, ALT, ALKPHOS, BILITOT, BILIDIR, IBILI in the last 72 hours. PT/INR No results for input(s): LABPROT, INR in the last 72 hours.   Impression / Plan: This is a 70 y.o.female who presents for EGD/Colonoscopy for evaluation of IDA and Colon Cancer Screening.  The risks and benefits of endoscopic evaluation/treatment were discussed with the patient and/or family; these include but are not limited to the risk of perforation, infection, bleeding, missed lesions, lack of diagnosis, severe illness requiring hospitalization, as well as anesthesia and sedation related illnesses.  The patient's history has been reviewed, patient examined, no change in status, and deemed stable for procedure.  The patient and/or family is agreeable to proceed.    Corliss Parish, MD [facility] Advanced [facility]oscopy Office # [phone]

## 2021-04-11 NOTE — Progress Notes (Signed)
VS by DT  Pt's states no medical or surgical changes since previsit or office visit.  

## 2021-04-11 NOTE — Op Note (Signed)
[facility] Patient Name: Shirley Juarez Procedure Date: 04/11/2021 2:47 PM MRN: [id number] Endoscopist: Justice Britain , MD Age: 70 Referring MD:  Date of Birth: 1950/09/12 Gender: Female Account #: [id number] Procedure:                Colonoscopy Indications:              Screening for colorectal malignant neoplasm,                            Incidental - Iron deficiency anemia Medicines:                Monitored Anesthesia Care Procedure:                Pre-Anesthesia Assessment:                           - Prior to the procedure, a History and Physical                            was performed, and patient medications and                            allergies were reviewed. The patient's tolerance of                            previous anesthesia was also reviewed. The risks                            and benefits of the procedure and the sedation                            options and risks were discussed with the patient.                            All questions were answered, and informed consent                            was obtained. Prior Anticoagulants: The patient has                            taken no previous anticoagulant or antiplatelet                            agents except for aspirin. ASA Grade Assessment:                            III - A patient with severe systemic disease. After                            reviewing the risks and benefits, the patient was                            deemed in satisfactory condition to undergo the  procedure.                           After obtaining informed consent, the colonoscope                            was passed under direct vision. Throughout the                            procedure, the patient's blood pressure, pulse, and                            oxygen saturations were monitored continuously. The                            [id number]-HQ190L Colonoscope was introduced through the                             anus and advanced to the the cecum, identified by                            appendiceal orifice and ileocecal valve. The                            colonoscopy was unusually difficult due to                            restricted mobility of the colon. Successful                            completion of the procedure was aided by changing                            the patient's position into extreme left lateral                            positioning then to supine positioning, using                            manual pressure in the suprapubic region,                            withdrawing and reinserting the scope,                            straightening and shortening the scope to obtain                            bowel loop reduction and using scope torsion. The                            patient tolerated the procedure. The quality of the  bowel preparation was adequate. The ileocecal                            valve, appendiceal orifice, and rectum were                            photographed. Scope In: 3:13:51 PM Scope Out: 3:32:10 PM Scope Withdrawal Time: 0 hours 7 minutes 20 seconds  Total Procedure Duration: 0 hours 18 minutes 19 seconds  Findings:                 The digital rectal exam findings include                            hemorrhoids. Pertinent negatives include no                            palpable rectal lesions.                           The recto-sigmoid colon was grossly tortuous.                           Normal mucosa was found in the entire colon.                           Multiple small-mouthed diverticula were found in                            the recto-sigmoid colon and sigmoid colon.                           Non-bleeding non-thrombosed external and internal                            hemorrhoids were found during retroflexion, during                            perianal exam and during digital exam. The                             hemorrhoids were Grade II (internal hemorrhoids                            that prolapse but reduce spontaneously). Complications:            No immediate complications. Estimated Blood Loss:     Estimated blood loss was minimal. Impression:               - Hemorrhoids found on digital rectal exam.                           - Tortuous colon.                           - Normal mucosa in the entire examined colon.                           -  Diverticulosis in the recto-sigmoid colon and in                            the sigmoid colon.                           - Non-bleeding non-thrombosed external and internal                            hemorrhoids. Recommendation:           - The patient will be observed post-procedure,                            until all discharge criteria are met.                           - Discharge patient to home.                           - Patient has a contact number available for                            emergencies. The signs and symptoms of potential                            delayed complications were discussed with the                            patient. Return to normal activities tomorrow.                            Written discharge instructions were provided to the                            patient.                           - High fiber diet.                           - Use FiberCon 1-2 tablets PO daily.                           - Continue Oral Iron daily at this time.                           - Continue present medications.                           - Await pathology results.                           - Repeat colonoscopy in 10 years for screening                            purposes.                           -  Recommend in 6-weeks to re-evaluate                            CBC/Iron/TIBC/Ferritin. If patient has persistent                            IDA, then recommend a VCE as next steps in patient                             evaluation.                           - The findings and recommendations were discussed                            with the patient.                           - The findings and recommendations were discussed                            with the patient's family. Justice Britain, MD 04/11/2021 3:43:53 PM

## 2021-04-11 NOTE — Progress Notes (Signed)
Per Dr. Meridee Score, after discussing finds with pt, he decided for pt to have blood work today on discharge.  He placed the orders in Epic.  Pt had a non-productive cough when waking up in the recovery room.  I asked pt to pant when she felt like coughing.  This did help her cough.  No other problems noted in the recovery room. maw

## 2021-04-11 NOTE — Progress Notes (Signed)
Sedate, gd SR, tolerated procedure well, VSS, report to RN 

## 2021-04-11 NOTE — Op Note (Signed)
[facility] Patient Name: Shirley Juarez Procedure Date: 04/11/2021 2:50 PM MRN: [id number] Endoscopist: Justice Britain , MD Age: 70 Referring MD:  Date of Birth: 05-15-1951 Gender: Female Account #: [id number] Procedure:                Upper GI endoscopy Indications:              Iron deficiency anemia Medicines:                Monitored Anesthesia Care Procedure:                Pre-Anesthesia Assessment:                           - Prior to the procedure, a History and Physical                            was performed, and patient medications and                            allergies were reviewed. The patient's tolerance of                            previous anesthesia was also reviewed. The risks                            and benefits of the procedure and the sedation                            options and risks were discussed with the patient.                            All questions were answered, and informed consent                            was obtained. Prior Anticoagulants: The patient has                            taken no previous anticoagulant or antiplatelet                            agents except for aspirin. ASA Grade Assessment:                            III - A patient with severe systemic disease. After                            reviewing the risks and benefits, the patient was                            deemed in satisfactory condition to undergo the                            procedure.  After obtaining informed consent, the endoscope was                            passed under direct vision. Throughout the                            procedure, the patient's blood pressure, pulse, and                            oxygen saturations were monitored continuously. The                            GIF HQ190 [id number] was introduced through the                            mouth, and advanced to the second part of duodenum.                             The upper GI endoscopy was accomplished without                            difficulty. The patient tolerated the procedure. Scope In: Scope Out: Findings:                 A single area of ectopic gastric mucosa was found                            in the proximal esophagus, 15 cm from the incisors.                           No other gross lesions were noted in the entire                            esophagus.                           The Z-line was irregular and was found 35 cm from                            the incisors.                           Many 5 to 10 mm semi-sessile polyps with no                            bleeding and no stigmata of recent bleeding were                            found in the cardia, in the gastric fundus and in                            the gastric body - consistent endoscopically with  fundic gland polyps. Biopsies were taken with a                            cold forceps for histology from the larger polyps                            to rule out adenomatous change.                           Patchy moderately erythematous mucosa without                            bleeding was found in the entire examined stomach.                            Biopsies were taken with a cold forceps for                            histology and Helicobacter pylori testing.                           No gross lesions were noted in the duodenal bulb,                            in the first portion of the duodenum and in the                            second portion of the duodenum. Biopsies for                            histology were taken with a cold forceps for                            evaluation of celiac disease. Complications:            No immediate complications. Estimated Blood Loss:     Estimated blood loss was minimal. Impression:               - Ectopic gastric mucosa in the proximal esophagus                            at  15 cm.                           - No other gross lesions in esophagus.                           - Z-line irregular, 35 cm from the incisors.                           - Multiple gastric polyps. Biopsied.                           - Erythematous mucosa in the stomach. Biopsied.                           -  No gross lesions in the duodenal bulb, in the                            first portion of the duodenum and in the second                            portion of the duodenum. Biopsied. Recommendation:           - Proceed to scheduled colonoscopy.                           - Increase Prevacid to twice daily dosing for next                            26-months.                           - Continue present medications.                           - Await pathology results.                           - The findings and recommendations were discussed                            with the patient.                           - The findings and recommendations were discussed                            with the patient's family. Justice Britain, MD 04/11/2021 3:38:49 PM

## 2021-04-11 NOTE — Patient Instructions (Addendum)
Handouts were given to your care partner on gastritis, diverticulosis, hemorrhoids, and a high fiber diet with liberal fluid intake. Per Dr. Meridee Score increase PREVACID to 2 times a day for the next 2 months.  Then, decrease to daily.  A new prescription was sent to your pharmacy. Use over the counter FIBERCON 1-2 tablets daily. Continue oral IRON daily at this time. Your sugar was 108 in the recovery room. You may resume your other current medications today. Await biopsy results.  May take 1-3 weeks to receive pathology results. Repeat colonoscopy in 10 years for screening purposes. Per Dr. Meridee Score he recommend to re-evaluate CBC/Iron/TTIBC/Ferritin blood work today on discharge. Please call if any questions or concerns.      YOU HAD AN ENDOSCOPIC PROCEDURE TODAY AT THE [facility]:   Refer to the procedure report that was given to you for any specific questions about what was found during the examination.  If the procedure report does not answer your questions, please call your gastroenterologist to clarify.  If you requested that your care partner not be given the details of your procedure findings, then the procedure report has been included in a sealed envelope for you to review at your convenience later.  YOU SHOULD EXPECT: Some feelings of bloating in the abdomen. Passage of more gas than usual.  Walking can help get rid of the air that was put into your GI tract during the procedure and reduce the bloating. If you had a lower endoscopy (such as a colonoscopy or flexible sigmoidoscopy) you may notice spotting of blood in your stool or on the toilet paper. If you underwent a bowel prep for your procedure, you may not have a normal bowel movement for a few days.  Please Note:  You might notice some irritation and congestion in your nose or some drainage.  This is from the oxygen used during your procedure.  There is no need for concern and it should clear up in a day or  so.  SYMPTOMS TO REPORT IMMEDIATELY:  Following lower endoscopy (colonoscopy or flexible sigmoidoscopy):  Excessive amounts of blood in the stool  Significant tenderness or worsening of abdominal pains  Swelling of the abdomen that is new, acute  Fever of 100F or higher  Following upper endoscopy (EGD)  Vomiting of blood or coffee ground material  New chest pain or pain under the shoulder blades  Painful or persistently difficult swallowing  New shortness of breath  Fever of 100F or higher  Black, tarry-looking stools  For urgent or emergent issues, a gastroenterologist can be reached at any hour by calling [phone]. Do not use MyChart messaging for urgent concerns.    DIET:  We do recommend a small meal at first, but then you may proceed to your regular diet.  Drink plenty of fluids but you should avoid alcoholic beverages for 24 hours.  ACTIVITY:  You should plan to take it easy for the rest of today and you should NOT DRIVE or use heavy machinery until tomorrow (because of the sedation medicines used during the test).    FOLLOW UP: Our staff will call the number listed on your records 48-72 hours following your procedure to check on you and address any questions or concerns that you may have regarding the information given to you following your procedure. If we do not reach you, we will leave a message.  We will attempt to reach you two times.  During this call, we will ask if  you have developed any symptoms of COVID 19. If you develop any symptoms (ie: fever, flu-like symptoms, shortness of breath, cough etc.) before then, please call [phone].  If you test positive for Covid 19 in the 2 weeks post procedure, please call and report this information to [name].    If any biopsies were taken you will be contacted by phone or by letter within the next 1-3 weeks.  Please call us at [phone] if you have not heard about the biopsies in 3 weeks.     SIGNATURES/CONFIDENTIALITY: You and/or your care partner have signed paperwork which will be entered into your electronic medical record.  These signatures attest to the fact that that the information above on your After Visit Summary has been reviewed and is understood.  Full responsibility of the confidentiality of this discharge information lies with you and/or your care-partner.

## 2021-04-11 NOTE — Progress Notes (Signed)
Called to room to assist during endoscopic procedure.  Patient ID and intended procedure confirmed with present staff. Received instructions for my participation in the procedure from the performing physician.  

## 2021-04-12 ENCOUNTER — Telehealth: Payer: Self-pay | Admitting: Hematology and Oncology

## 2021-04-12 ENCOUNTER — Other Ambulatory Visit: Payer: Self-pay

## 2021-04-12 DIAGNOSIS — D509 Iron deficiency anemia, unspecified: Secondary | ICD-10-CM

## 2021-04-12 NOTE — Telephone Encounter (Signed)
Scheduled appt per 11/9 referral. Pt is aware of appt date and time.  

## 2021-04-13 ENCOUNTER — Telehealth: Payer: Self-pay

## 2021-04-13 NOTE — Telephone Encounter (Signed)
  Follow up Call-  Call back number 04/11/2021  Post procedure Call Back phone  # [phone]  Permission to leave phone message Yes  Some recent data might be hidden     Patient questions:  Do you have a fever, pain , or abdominal swelling? No. Pain Score  0 *  Have you tolerated food without any problems? No.  Have you been able to return to your normal activities? No.  Do you have any questions about your discharge instructions: Diet   No. Medications  No. Follow up visit  No.  Do you have questions or concerns about your Care? No.  Actions: * If pain score is 4 or above: No action needed, pain <4.   Have you developed a fever since your procedure? no  2.   Have you had an respiratory symptoms (SOB or cough) since your procedure? no  3.   Have you tested positive for COVID 19 since your procedure no  4.   Have you had any family members/close contacts diagnosed with the COVID 19 since your procedure?  no   If yes to any of these questions please route to Laverna Peace, RN and Karlton Lemon, RN

## 2021-04-17 ENCOUNTER — Encounter: Payer: Self-pay | Admitting: Gastroenterology

## 2021-04-30 NOTE — Progress Notes (Signed)
[facility] CONSULT NOTE  Patient Care Team: Westermann, Carola, MD as PCP - General (Family Medicine) McAlhany, Christopher D, MD as PCP - Cardiology (Cardiology)  CHIEF COMPLAINTS/PURPOSE OF CONSULTATION:  Newly diagnosed iron deficiency anemia  HISTORY OF PRESENTING ILLNESS:  Shirley Juarez 70 y.o. female is here because of recent diagnosis of evaluation of iron deficiency anemia. Labs on 04/11/2021 showed Hg 9.9, HCT 30.4, MCV 78.0, PLT 348. She presents to the clinic today for initial evaluation and discussion of treatment options.  She was first told that she has iron deficiency in August when she was admitted to the hospital with severe urinary tract infection at that time she was microcytic and received 1 unit of PRBC and supposedly 1 dose of IV iron.  She was discharged home with oral iron therapy she gets very intolerant of oral iron therapy with severe constipation..  Most recently on 04/11/2021 her hemoglobin was 9.9 with an MCV of 78.  She was referred to us for continuation of IV iron treatment.  She continues to feel moderately fatigued.  I reviewed her records extensively and collaborated the history with the patient.  MEDICAL HISTORY:  Past Medical History:  Diagnosis Date   Anxiety    Arthritis    Osteoarthritis- knee and back   Coronary artery disease    Cath 2004 in [place], [place] with minor CAD   Depression    Diabetes mellitus    On oral and diet   GERD (gastroesophageal reflux disease)    Heart murmur    Hyperlipidemia    Hypertension    Sleep apnea    does not use CPAP- it is broken.  Last sleep study was 7 years ago.  Has not worn in  4  years    SURGICAL HISTORY: Past Surgical History:  Procedure Laterality Date   ABDOMINAL HYSTERECTOMY     COLONOSCOPY     Dr.Mann   KNEE ARTHROCENTESIS     TOTAL KNEE ARTHROPLASTY  06/22/2011   Bilateral     SOCIAL HISTORY: Social History   Socioeconomic History   Marital status: Married     Spouse name: Not on file   Number of children: 2   Years of education: Not on file   Highest education level: Not on file  Occupational History   Occupation: Retired [employer]  Tobacco Use   Smoking status: Never   Smokeless tobacco: Never  Vaping Use   Vaping Use: Never used  Substance and Sexual Activity   Alcohol use: No   Drug use: Not Currently   Sexual activity: Yes    Birth control/protection: Surgical  Other Topics Concern   Not on file  Social History Narrative   Not on file   Social Determinants of Health   Financial Resource Strain: Not on file  Food Insecurity: Not on file  Transportation Needs: Not on file  Physical Activity: Not on file  Stress: Not on file  Social Connections: Not on file  Intimate Partner Violence: Not on file    FAMILY HISTORY: Family History  Problem Relation Age of Onset   Heart attack Mother 35   Liver disease Father    Heart attack Son        Age 40, he is our patient   Anesthesia problems Neg Hx    Colon cancer Neg Hx    Esophageal cancer Neg Hx    Stomach cancer Neg Hx     ALLERGIES:  is allergic to latex,   codeine, covid-19 mrna vacc (moderna), crestor [rosuvastatin calcium], fish oil, hydralazine, lipitor [atorvastatin calcium], peanut-containing drug products, statins, welchol [colesevelam hcl], zithromax [azithromycin], and other.  MEDICATIONS:  Current Outpatient Medications  Medication Sig Dispense Refill   acetaminophen (TYLENOL) 325 MG tablet Take 325-650 mg by mouth every 6 (six) hours as needed for mild pain or headache.     ALPRAZolam (XANAX) 0.5 MG tablet Take 0.5 mg by mouth in the morning and at bedtime.     aspirin EC 81 MG tablet Take 81 mg by mouth at bedtime.       Blood Glucose Monitoring Suppl (ONETOUCH VERIO) w/Device KIT use to test your blood sugar     Calcium Citrate-Vitamin D (CALCIUM CITRATE + D3 PO) Take 1 tablet by mouth every Monday, Wednesday, and Friday.     docusate sodium (COLACE) 100 MG  capsule Take 100 mg by mouth daily.     estradiol (ESTRACE) 2 MG tablet Take 1 mg by mouth 2 (two) times a week.     ferrous gluconate (FERGON) 324 MG tablet Take 1 tablet (324 mg total) by mouth daily with breakfast. 30 tablet 3   gemfibrozil (LOPID) 600 MG tablet Take 600 mg by mouth 2 (two) times daily before a meal.      glucose blood (ONETOUCH VERIO) test strip use to test your blood sugar     hydrALAZINE (APRESOLINE) 100 MG tablet Take 1 tablet (100 mg total) by mouth in the morning and at bedtime.     hydrOXYzine (ATARAX/VISTARIL) 25 MG tablet Take 25 mg by mouth at bedtime.       lansoprazole (PREVACID) 30 MG capsule Take 1 capsule (30 mg total) by mouth 2 (two) times daily before a meal. 60 capsule 4   metFORMIN (GLUCOPHAGE) 500 MG tablet Take 1,000 mg by mouth daily with breakfast.     ondansetron (ZOFRAN) 4 MG tablet Take 1 tablet (4 mg total) by mouth as directed. Take one Zofran pill 30-60 minutes before each colonoscopy prep dose 2 tablet 0   tamsulosin (FLOMAX) 0.4 MG CAPS capsule Take 1 capsule (0.4 mg total) by mouth daily after supper. 30 capsule 2   venlafaxine XR (EFFEXOR-XR) 150 MG 24 hr capsule Take 150 mg by mouth at bedtime.     Current Facility-Administered Medications  Medication Dose Route Frequency Provider Last Rate Last Admin   0.9 %  sodium chloride infusion  500 mL Intravenous Once Mansouraty, Gabriel Jr., MD        REVIEW OF SYSTEMS:   Constitutional: Denies fevers, chills or abnormal night sweats   All other systems were reviewed with the patient and are negative.  PHYSICAL EXAMINATION: ECOG PERFORMANCE STATUS: 1 - Symptomatic but completely ambulatory  Vitals:   05/01/21 1251  BP: (!) 155/61  Pulse: 88  Resp: 18  Temp: 97.7 F (36.5 C)  SpO2: 99%   Filed Weights   05/01/21 1251  Weight: 142 lb 6.4 oz (64.6 kg)    Lab Results  Component Value Date   WBC 3.4 (L) 04/11/2021   HGB 9.9 (L) 04/11/2021   HCT 30.4 (L) 04/11/2021   MCV 78.0  04/11/2021   PLT 348.0 04/11/2021   Lab Results  Component Value Date   NA 124 (L) 02/13/2021   K 3.7 02/13/2021   CL 91 (L) 02/13/2021   CO2 19 (L) 02/13/2021    RADIOGRAPHIC STUDIES: I have personally reviewed the radiological reports and agreed with the findings in the report.  ASSESSMENT AND   PLAN:  Iron deficiency anemia Iron deficiency anemia: Discovered during her hospitalization in August 2022 (she was hospitalized for severe UTIs) hemoglobin was 6.9 MCV 72 (1 unit of PRBC and IV iron x1 dose) Upper endoscopy and colonoscopy performed November 2022: No evidence of bleeding  Iron deficiency anemia: I discussed with the patient the process of iron absorption. I counseled extensively regarding the different causes of iron deficiency including blood loss and malabsorption.   It is possible that the patient may still have an occult source of bleeding. However malabsorption is also a possibility.   Recommendation: 1. Stop oral iron (patient was intolerant to oral iron) 2. proceed with IV iron infusions  I discussed with the patient that potentially there may be a need for additional IV iron infusions if the iron levels were to remain low in the future. The frequency of need of IV iron would depend on many other factors including the rate of loss and by the degree of absorption.  Return to clinic in 4 months with recheck on iron studies and hemoglobin.  And follow-up the following day to discuss results    All questions were answered. The patient knows to call the clinic with any problems, questions or concerns.   Vinay K Gudena, MD, MPH 05/01/2021    I, Kirstyn Evans, am acting as scribe for Vinay Gudena, MD.  I have reviewed the above documentation for accuracy and completeness, and I agree with the above.     

## 2021-05-01 ENCOUNTER — Inpatient Hospital Stay
Payer: Federal, State, Local not specified - PPO | Attending: Hematology and Oncology | Admitting: Hematology and Oncology

## 2021-05-01 ENCOUNTER — Other Ambulatory Visit: Payer: Self-pay

## 2021-05-01 DIAGNOSIS — E119 Type 2 diabetes mellitus without complications: Secondary | ICD-10-CM | POA: Insufficient documentation

## 2021-05-01 DIAGNOSIS — Z90710 Acquired absence of both cervix and uterus: Secondary | ICD-10-CM | POA: Diagnosis not present

## 2021-05-01 DIAGNOSIS — I10 Essential (primary) hypertension: Secondary | ICD-10-CM | POA: Insufficient documentation

## 2021-05-01 DIAGNOSIS — D509 Iron deficiency anemia, unspecified: Secondary | ICD-10-CM | POA: Diagnosis not present

## 2021-05-01 NOTE — Assessment & Plan Note (Signed)
Iron deficiency anemia: Discovered during her hospitalization in August 2022 (she was hospitalized for severe UTIs) hemoglobin was 6.9 MCV 72 (1 unit of PRBC and IV iron x1 dose) Upper endoscopy and colonoscopy performed November 2022: No evidence of bleeding  Iron deficiency anemia: I discussed with the patient the process of iron absorption. I counseled extensively regarding the different causes of iron deficiency including blood loss and malabsorption.   It is possible that the patient may still have an occult source of bleeding. However malabsorption is also a possibility.   Recommendation: 1. Stop oral iron (patient was intolerant to oral iron) 2. proceed with IV iron infusions  I discussed with the patient that potentially there may be a need for additional IV iron infusions if the iron levels were to remain low in the future. The frequency of need of IV iron would depend on many other factors including the rate of loss and by the degree of absorption.  Return to clinic in 4 months with recheck on iron studies and hemoglobin.  And follow-up the following day to discuss results

## 2021-05-13 ENCOUNTER — Inpatient Hospital Stay: Payer: Federal, State, Local not specified - PPO | Attending: Hematology and Oncology

## 2021-05-13 ENCOUNTER — Other Ambulatory Visit: Payer: Self-pay

## 2021-05-13 VITALS — BP 128/49 | HR 95 | Temp 98.0°F | Resp 18

## 2021-05-13 DIAGNOSIS — D509 Iron deficiency anemia, unspecified: Secondary | ICD-10-CM | POA: Insufficient documentation

## 2021-05-13 MED ORDER — SODIUM CHLORIDE 0.9 % IV SOLN
300.0000 mg | Freq: Once | INTRAVENOUS | Status: AC
  Administered 2021-05-13: 300 mg via INTRAVENOUS
  Filled 2021-05-13: qty 10

## 2021-05-13 MED ORDER — SODIUM CHLORIDE 0.9 % IV SOLN: Freq: Once | INTRAVENOUS | Status: AC

## 2021-05-13 NOTE — Patient Instructions (Signed)

## 2021-05-20 ENCOUNTER — Inpatient Hospital Stay: Payer: Federal, State, Local not specified - PPO

## 2021-05-20 ENCOUNTER — Other Ambulatory Visit: Payer: Self-pay

## 2021-05-20 VITALS — BP 158/56 | HR 70 | Temp 97.8°F | Resp 16

## 2021-05-20 DIAGNOSIS — D509 Iron deficiency anemia, unspecified: Secondary | ICD-10-CM | POA: Diagnosis not present

## 2021-05-20 MED ORDER — SODIUM CHLORIDE 0.9 % IV SOLN: Freq: Once | INTRAVENOUS | Status: AC

## 2021-05-20 MED ORDER — SODIUM CHLORIDE 0.9 % IV SOLN
300.0000 mg | Freq: Once | INTRAVENOUS | Status: AC
  Administered 2021-05-20: 300 mg via INTRAVENOUS
  Filled 2021-05-20: qty 300

## 2021-05-20 NOTE — Progress Notes (Signed)
Pt tolerated iv iron well. Advised to call office with concerns. Pt verbalized understanding.

## 2021-06-01 ENCOUNTER — Ambulatory Visit: Payer: Federal, State, Local not specified - PPO | Admitting: Cardiovascular Disease

## 2021-06-03 ENCOUNTER — Inpatient Hospital Stay: Payer: Federal, State, Local not specified - PPO

## 2021-06-03 ENCOUNTER — Other Ambulatory Visit: Payer: Self-pay

## 2021-06-03 VITALS — BP 164/66 | HR 71 | Temp 97.8°F | Resp 16

## 2021-06-03 DIAGNOSIS — D509 Iron deficiency anemia, unspecified: Secondary | ICD-10-CM

## 2021-06-03 MED ORDER — SODIUM CHLORIDE 0.9 % IV SOLN
300.0000 mg | Freq: Once | INTRAVENOUS | Status: AC
  Administered 2021-06-03: 300 mg via INTRAVENOUS
  Filled 2021-06-03: qty 300

## 2021-06-03 MED ORDER — SODIUM CHLORIDE 0.9 % IV SOLN: Freq: Once | INTRAVENOUS | Status: AC

## 2021-06-03 NOTE — Patient Instructions (Signed)
[facility] MEDICAL ONCOLOGY  Discharge Instructions: Thank you for choosing [facility] to provide your oncology and hematology care.   If you have a lab appointment with the Cancer Center, please go directly to the Cancer Center and check in at the registration area.   Wear comfortable clothing and clothing appropriate for easy access to any Portacath or PICC line.   We strive to give you quality time with your provider. You may need to reschedule your appointment if you arrive late (15 or more minutes).  Arriving late affects you and other patients whose appointments are after yours.  Also, if you miss three or more appointments without notifying the office, you may be dismissed from the clinic at the providers discretion.      For prescription refill requests, have your pharmacy contact our office and allow 72 hours for refills to be completed.    You got venofer today.    To help prevent nausea and vomiting after your treatment, we encourage you to take your nausea medication as directed.  BELOW ARE SYMPTOMS THAT SHOULD BE REPORTED IMMEDIATELY: *FEVER GREATER THAN 100.4 F (38 C) OR HIGHER *CHILLS OR SWEATING *NAUSEA AND VOMITING THAT IS NOT CONTROLLED WITH YOUR NAUSEA MEDICATION *UNUSUAL SHORTNESS OF BREATH *UNUSUAL BRUISING OR BLEEDING *URINARY PROBLEMS (pain or burning when urinating, or frequent urination) *BOWEL PROBLEMS (unusual diarrhea, constipation, pain near the anus) TENDERNESS IN MOUTH AND THROAT WITH OR WITHOUT PRESENCE OF ULCERS (sore throat, sores in mouth, or a toothache) UNUSUAL RASH, SWELLING OR PAIN  UNUSUAL VAGINAL DISCHARGE OR ITCHING   Items with * indicate a potential emergency and should be followed up as soon as possible or go to the Emergency Department if any problems should occur.  Please show the CHEMOTHERAPY ALERT CARD or IMMUNOTHERAPY ALERT CARD at check-in to the Emergency Department and triage nurse.  Should you have  questions after your visit or need to cancel or reschedule your appointment, please contact [facility] MEDICAL ONCOLOG[facility]  Dept: [phone]  and follow the prompts.  Office hours are 8:00 a.m. to 4:30 p.m. Monday - Friday. Please note that voicemails left after 4:00 p.m. may not be returned until the following business day.  We are closed weekends and major holidays. You have access to a nurse at all times for urgent questions. Please call the main number to the clinic Dept: [phone] and follow the prompts.   For any non-urgent questions, you may also contact your provider using MyChart. We now offer e-Visits for anyone 71 and older to request care online for non-urgent symptoms. For details visit [email or web address].   Also download the MyChart app! Go to the app store, search "MyChart", open the app, select [facility], and log in with your MyChart username and password.  Due to Covid, a mask is required upon entering the hospital/clinic. If you do not have a mask, one will be given to you upon arrival. For doctor visits, patients may have 1 support person aged 71 or older with them. For treatment visits, patients cannot have anyone with them due to current Covid guidelines and our immunocompromised population.

## 2021-08-01 ENCOUNTER — Other Ambulatory Visit: Payer: Self-pay | Admitting: *Deleted

## 2021-08-01 ENCOUNTER — Other Ambulatory Visit: Payer: Self-pay | Admitting: Cardiovascular Disease

## 2021-08-01 DIAGNOSIS — D509 Iron deficiency anemia, unspecified: Secondary | ICD-10-CM

## 2021-08-02 ENCOUNTER — Other Ambulatory Visit: Payer: Self-pay

## 2021-08-02 ENCOUNTER — Inpatient Hospital Stay: Payer: Federal, State, Local not specified - PPO | Attending: Hematology and Oncology

## 2021-08-02 ENCOUNTER — Encounter: Payer: Self-pay | Admitting: Hematology and Oncology

## 2021-08-02 DIAGNOSIS — D509 Iron deficiency anemia, unspecified: Secondary | ICD-10-CM | POA: Insufficient documentation

## 2021-08-02 LAB — CBC WITH DIFFERENTIAL (CANCER CENTER ONLY)
Abs Immature Granulocytes: 0.03 10*3/uL (ref 0.00–0.07)
Basophils Absolute: 0 10*3/uL (ref 0.0–0.1)
Basophils Relative: 1 %
Eosinophils Absolute: 0.1 10*3/uL (ref 0.0–0.5)
Eosinophils Relative: 3 %
HCT: 36.4 % (ref 36.0–46.0)
Hemoglobin: 11.3 g/dL — ABNORMAL LOW (ref 12.0–15.0)
Immature Granulocytes: 1 %
Lymphocytes Relative: 25 %
Lymphs Abs: 0.9 10*3/uL (ref 0.7–4.0)
MCH: 24.5 pg — ABNORMAL LOW (ref 26.0–34.0)
MCHC: 31 g/dL (ref 30.0–36.0)
MCV: 79 fL — ABNORMAL LOW (ref 80.0–100.0)
Monocytes Absolute: 0.3 10*3/uL (ref 0.1–1.0)
Monocytes Relative: 8 %
Neutro Abs: 2.2 10*3/uL (ref 1.7–7.7)
Neutrophils Relative %: 62 %
Platelet Count: 264 10*3/uL (ref 150–400)
RBC: 4.61 MIL/uL (ref 3.87–5.11)
RDW: 18.3 % — ABNORMAL HIGH (ref 11.5–15.5)
WBC Count: 3.5 10*3/uL — ABNORMAL LOW (ref 4.0–10.5)
nRBC: 0 % (ref 0.0–0.2)

## 2021-08-02 LAB — IRON AND IRON BINDING CAPACITY (CC-WL,HP ONLY)
Iron: 39 ug/dL (ref 28–170)
Saturation Ratios: 7 % — ABNORMAL LOW (ref 10.4–31.8)
TIBC: 553 ug/dL — ABNORMAL HIGH (ref 250–450)
UIBC: 514 ug/dL — ABNORMAL HIGH (ref 148–442)

## 2021-08-02 LAB — FERRITIN: Ferritin: 20 ng/mL (ref 11–307)

## 2021-08-02 NOTE — Progress Notes (Signed)
?  HEMATOLOGY-ONCOLOGY TELEPHONE VISIT PROGRESS NOTE ? ?I connected with Shirley Juarez on 08/03/2021 at 10:30 AM EST by telephone and verified that I am speaking with the correct person using two identifiers.  ?I discussed the limitations, risks, security and privacy concerns of performing an evaluation and management service by telephone and the availability of in person appointments.  ?I also discussed with the patient that there may be a patient responsible charge related to this service. The patient expressed understanding and agreed to proceed.  ? ?History of Present Illness: Shirley Juarez is a 71 y.o. female with above-mentioned history of iron deficiency anemia. Labs on 04/11/2021 showed Hg 11.3, HCT 36.4, MCV 79.0, PLT 264. She presents via telephone today for follow-up. ?She reports continued symptoms of fatigue.  Although her symptoms are better than before she is still has significant fatigue that limits her quality of life. ? ?Observations/Objective:  ? ?  ?Assessment Plan:  ?Iron deficiency anemia ?Iron deficiency anemia: Discovered during her hospitalization in August 2022 (she was hospitalized for severe UTIs) hemoglobin was 6.9 MCV 72 (1 unit of PRBC and IV iron x1 dose) ?Upper endoscopy and colonoscopy performed November 2022: No evidence of bleeding ?  ?IV Iron: Dec 2022  ?  ?Lab Review: ?08/02/21: WBC: 3.5, Hb 11.3, Ferritin: 20, Iron Sat: 7%, TIBC: 553 ?Based on severity of her symptoms and the 7% iron saturation I recommended 3 more doses of IV iron ?We will see her back in 3 months with labs done ahead of time and a telephone visit after that. ? ? ?I discussed the assessment and treatment plan with the patient. The patient was provided an opportunity to ask questions and all were answered. The patient agreed with the plan and demonstrated an understanding of the instructions. The patient was advised to call back or seek an in-person evaluation if the symptoms worsen or if the condition fails to  improve as anticipated.  ? ?Total time spent: 15 mins including non-face to face time and time spent for planning, charting and coordination of care ? ?Sabas Sous, MD ?08/03/2021  ? ? I, Alda Ponder, am acting as scribe for Serena Croissant, MD. ? ?I have reviewed the above documentation for accuracy and completeness, and I agree with the above. ?  ? ?

## 2021-08-02 NOTE — Assessment & Plan Note (Signed)
Iron deficiency anemia: Discovered during her hospitalization in August 2022 (she was hospitalized for severe UTIs) hemoglobin was 6.9 MCV 72 (1 unit of PRBC and IV iron x1 dose) ?Upper endoscopy and colonoscopy performed November 2022: No evidence of bleeding ?? ?IV Iron: Dec 2022? ?? ?Lab Review: ?08/02/21: WBC: 3.5, Hb 11.3, Ferritin: 20, Iron Sat: 7%, TIBC: 553 ?

## 2021-08-03 ENCOUNTER — Inpatient Hospital Stay (HOSPITAL_BASED_OUTPATIENT_CLINIC_OR_DEPARTMENT_OTHER): Payer: Federal, State, Local not specified - PPO | Admitting: Hematology and Oncology

## 2021-08-03 DIAGNOSIS — D509 Iron deficiency anemia, unspecified: Secondary | ICD-10-CM

## 2021-08-11 ENCOUNTER — Telehealth: Payer: Self-pay | Admitting: Hematology and Oncology

## 2021-08-11 ENCOUNTER — Telehealth: Payer: Self-pay

## 2021-08-11 NOTE — Telephone Encounter (Signed)
.  Called patient to schedule appointment per 3/10 inbasket, patient is aware of date and time.   ?

## 2021-08-11 NOTE — Telephone Encounter (Signed)
Pt called and states she has not received a call from schedulers regarding IV iron appts or 3 mo f/u w/MD. Dr Pamelia Hoit sent LOS 3/2 regarding pt's need for IV iron. High Priority message sent to scheduling to expedite this. Pt knows to call if she has not heard anything by 3/14. ? ?

## 2021-08-16 ENCOUNTER — Other Ambulatory Visit: Payer: Self-pay | Admitting: Hematology and Oncology

## 2021-08-16 ENCOUNTER — Inpatient Hospital Stay: Payer: Federal, State, Local not specified - PPO

## 2021-08-16 ENCOUNTER — Other Ambulatory Visit: Payer: Self-pay

## 2021-08-16 VITALS — BP 159/67 | HR 77 | Temp 97.9°F | Resp 18

## 2021-08-16 DIAGNOSIS — D509 Iron deficiency anemia, unspecified: Secondary | ICD-10-CM

## 2021-08-16 MED ORDER — SODIUM CHLORIDE 0.9 % IV SOLN: INTRAVENOUS | Status: DC

## 2021-08-16 MED ORDER — SODIUM CHLORIDE 0.9 % IV SOLN
300.0000 mg | Freq: Once | INTRAVENOUS | Status: AC
  Administered 2021-08-16: 300 mg via INTRAVENOUS
  Filled 2021-08-16: qty 300

## 2021-08-16 NOTE — Patient Instructions (Signed)

## 2021-08-24 ENCOUNTER — Inpatient Hospital Stay: Payer: Federal, State, Local not specified - PPO

## 2021-08-24 ENCOUNTER — Other Ambulatory Visit: Payer: Self-pay

## 2021-08-24 VITALS — BP 150/53 | HR 90 | Resp 16

## 2021-08-24 DIAGNOSIS — D509 Iron deficiency anemia, unspecified: Secondary | ICD-10-CM | POA: Diagnosis not present

## 2021-08-24 MED ORDER — SODIUM CHLORIDE 0.9 % IV SOLN: INTRAVENOUS | Status: DC

## 2021-08-24 MED ORDER — SODIUM CHLORIDE 0.9 % IV SOLN
300.0000 mg | Freq: Once | INTRAVENOUS | Status: AC
  Administered 2021-08-24: 300 mg via INTRAVENOUS
  Filled 2021-08-24: qty 300

## 2021-08-24 NOTE — Patient Instructions (Signed)

## 2021-08-24 NOTE — Progress Notes (Signed)
Iron infusion given per orders. Patient tolerated it well without problems. Vitals stable and discharged home from clinic ambulatory. Follow up as scheduled.  

## 2021-09-01 ENCOUNTER — Other Ambulatory Visit: Payer: Self-pay

## 2021-09-01 ENCOUNTER — Encounter: Payer: Self-pay | Admitting: Hematology and Oncology

## 2021-09-01 ENCOUNTER — Inpatient Hospital Stay: Payer: Federal, State, Local not specified - PPO

## 2021-09-01 VITALS — BP 133/51 | HR 80 | Temp 98.3°F | Resp 16

## 2021-09-01 DIAGNOSIS — D509 Iron deficiency anemia, unspecified: Secondary | ICD-10-CM | POA: Diagnosis not present

## 2021-09-01 MED ORDER — SODIUM CHLORIDE 0.9 % IV SOLN
300.0000 mg | Freq: Once | INTRAVENOUS | Status: AC
  Administered 2021-09-01: 300 mg via INTRAVENOUS
  Filled 2021-09-01: qty 300

## 2021-09-01 MED ORDER — SODIUM CHLORIDE 0.9 % IV SOLN: Freq: Once | INTRAVENOUS | Status: AC

## 2021-09-01 NOTE — Patient Instructions (Signed)

## 2021-09-01 NOTE — Progress Notes (Signed)
Patient declined to stay for 30 minute post observation time. Vitals stable and patient in no distress upon leaving infusion clinic.  ?

## 2021-11-02 NOTE — Progress Notes (Signed)
HEMATOLOGY-ONCOLOGY TELEPHONE VISIT PROGRESS NOTE  I connected with Shirley Juarez on 11/16/21 at 10:30 AM EDT by telephone and verified that I am speaking with the correct person using two identifiers.  I discussed the limitations, risks, security and privacy concerns of performing an evaluation and management service by telephone and the availability of in person appointments.  I also discussed with the patient that there may be a patient responsible charge related to this service. The patient expressed understanding and agreed to proceed.   History of Present Illness: Shirley Juarez is a 71 y.o. female with above-mentioned history of iron deficiency anemia. She presents to the clinic today for via  telephone follow-up. Still feels really tired.  REVIEW OF SYSTEMS:   Constitutional: Denies fevers, chills or abnormal weight loss All other systems were reviewed with the patient and are negative. Observations/Objective:    Assessment Plan:  Iron deficiency anemia Iron deficiency anemia: Discovered during her hospitalization in August 2022 (she was hospitalized for severe UTIs) hemoglobin was 6.9 MCV 72 (1 unit of PRBC and IV iron x1 dose) Upper endoscopy and colonoscopy performed November 2022: No evidence of bleeding   IV Iron: Dec 2022, Mar 2023    Lab Review: 08/02/21: WBC: 3.5, Hb 11.3, Ferritin: 20, Iron Sat: 7%, TIBC: 553 11/14/21: Ferritin 103, Hb 12.3, Sat: 11%  No role of IV Iron at this time Ftigue: Unclear etiology. I discussed with her that it is not related to lack of Iron. She will check with her PCP  Low WBC count: with normal diff. Will watch and monitor Recheck labs and MD visit in 3 months    I discussed the assessment and treatment plan with the patient. The patient was provided an opportunity to ask questions and all were answered. The patient agreed with the plan and demonstrated an understanding of the instructions. The patient was advised to call back or seek an in-person  evaluation if the symptoms worsen or if the condition fails to improve as anticipated.   I provided 12 minutes of non-face-to-face time during this encounter. Tamsen Meek, MD  I Janan Ridge am scribing for Dr. Pamelia Hoit  I have reviewed the above documentation for accuracy and completeness, and I agree with the above.

## 2021-11-06 DIAGNOSIS — M8580 Other specified disorders of bone density and structure, unspecified site: Secondary | ICD-10-CM | POA: Insufficient documentation

## 2021-11-06 DIAGNOSIS — N952 Postmenopausal atrophic vaginitis: Secondary | ICD-10-CM | POA: Insufficient documentation

## 2021-11-06 DIAGNOSIS — E669 Obesity, unspecified: Secondary | ICD-10-CM | POA: Insufficient documentation

## 2021-11-06 DIAGNOSIS — R32 Unspecified urinary incontinence: Secondary | ICD-10-CM | POA: Insufficient documentation

## 2021-11-06 DIAGNOSIS — F329 Major depressive disorder, single episode, unspecified: Secondary | ICD-10-CM | POA: Insufficient documentation

## 2021-11-06 DIAGNOSIS — L309 Dermatitis, unspecified: Secondary | ICD-10-CM | POA: Insufficient documentation

## 2021-11-06 DIAGNOSIS — T466X5A Adverse effect of antihyperlipidemic and antiarteriosclerotic drugs, initial encounter: Secondary | ICD-10-CM | POA: Insufficient documentation

## 2021-11-06 DIAGNOSIS — L509 Urticaria, unspecified: Secondary | ICD-10-CM | POA: Insufficient documentation

## 2021-11-13 ENCOUNTER — Other Ambulatory Visit: Payer: Self-pay | Admitting: *Deleted

## 2021-11-13 DIAGNOSIS — D509 Iron deficiency anemia, unspecified: Secondary | ICD-10-CM

## 2021-11-14 ENCOUNTER — Inpatient Hospital Stay: Payer: Federal, State, Local not specified - PPO | Attending: Hematology and Oncology

## 2021-11-14 ENCOUNTER — Other Ambulatory Visit: Payer: Self-pay

## 2021-11-14 DIAGNOSIS — Z87440 Personal history of urinary (tract) infections: Secondary | ICD-10-CM | POA: Diagnosis not present

## 2021-11-14 DIAGNOSIS — D509 Iron deficiency anemia, unspecified: Secondary | ICD-10-CM

## 2021-11-14 LAB — CBC WITH DIFFERENTIAL (CANCER CENTER ONLY)
Abs Immature Granulocytes: 0.04 10*3/uL (ref 0.00–0.07)
Basophils Absolute: 0 10*3/uL (ref 0.0–0.1)
Basophils Relative: 1 %
Eosinophils Absolute: 0.1 10*3/uL (ref 0.0–0.5)
Eosinophils Relative: 4 %
HCT: 36 % (ref 36.0–46.0)
Hemoglobin: 12.3 g/dL (ref 12.0–15.0)
Immature Granulocytes: 1 %
Lymphocytes Relative: 27 %
Lymphs Abs: 0.9 10*3/uL (ref 0.7–4.0)
MCH: 27.7 pg (ref 26.0–34.0)
MCHC: 34.2 g/dL (ref 30.0–36.0)
MCV: 81.1 fL (ref 80.0–100.0)
Monocytes Absolute: 0.3 10*3/uL (ref 0.1–1.0)
Monocytes Relative: 9 %
Neutro Abs: 1.9 10*3/uL (ref 1.7–7.7)
Neutrophils Relative %: 58 %
Platelet Count: 289 10*3/uL (ref 150–400)
RBC: 4.44 MIL/uL (ref 3.87–5.11)
RDW: 15.1 % (ref 11.5–15.5)
WBC Count: 3.3 10*3/uL — ABNORMAL LOW (ref 4.0–10.5)
nRBC: 0 % (ref 0.0–0.2)

## 2021-11-14 LAB — IRON AND IRON BINDING CAPACITY (CC-WL,HP ONLY)
Iron: 52 ug/dL (ref 28–170)
Saturation Ratios: 11 % (ref 10.4–31.8)
TIBC: 482 ug/dL — ABNORMAL HIGH (ref 250–450)
UIBC: 430 ug/dL (ref 148–442)

## 2021-11-14 LAB — FERRITIN: Ferritin: 103 ng/mL (ref 11–307)

## 2021-11-16 ENCOUNTER — Inpatient Hospital Stay (HOSPITAL_BASED_OUTPATIENT_CLINIC_OR_DEPARTMENT_OTHER): Payer: Federal, State, Local not specified - PPO | Admitting: Hematology and Oncology

## 2021-11-16 DIAGNOSIS — D509 Iron deficiency anemia, unspecified: Secondary | ICD-10-CM | POA: Diagnosis not present

## 2021-11-16 NOTE — Assessment & Plan Note (Signed)
Iron deficiency anemia: Discovered during her hospitalization in August 2022 (she was hospitalized for severe UTIs) hemoglobin was 6.9 MCV 72 (1 unit of PRBC and IV iron x1 dose) Upper endoscopy and colonoscopy performed November 2022: No evidence of bleeding  IV Iron: Dec 2022, Mar 2023  Lab Review: 08/02/21: WBC: 3.5, Hb 11.3, Ferritin: 20, Iron Sat: 7%, TIBC: 553 11/14/21: Ferritin 103, Hb 12.3, Sat: 11%  No role of IV Iron at this time Recheck labs and MD visit in 6 months

## 2021-11-17 ENCOUNTER — Telehealth: Payer: Self-pay | Admitting: Hematology and Oncology

## 2021-11-17 NOTE — Telephone Encounter (Signed)
Scheduled appointment per 6/15 los. Patient is aware. 

## 2021-11-24 IMAGING — CT CT CHEST W/O CM
2 of 4 series · 13 of 36 positions shown, 16 images · non-contrast
Comparison: CT abdomen pelvis, 01/10/2021

CLINICAL DATA: Abdominal distension, respiratory illness

EXAM:
CT CHEST, ABDOMEN AND PELVIS WITHOUT CONTRAST
TECHNIQUE: Multidetector CT imaging of the chest, abdomen and pelvis was
performed following the standard protocol without IV contrast.

[Series 2: cap w/o · axial · non-contrast · 0.83mm/px · z∈[-569,-74]mm · 10 of 119 slices shown, 13 images]
[im 10/119  mediastinal]
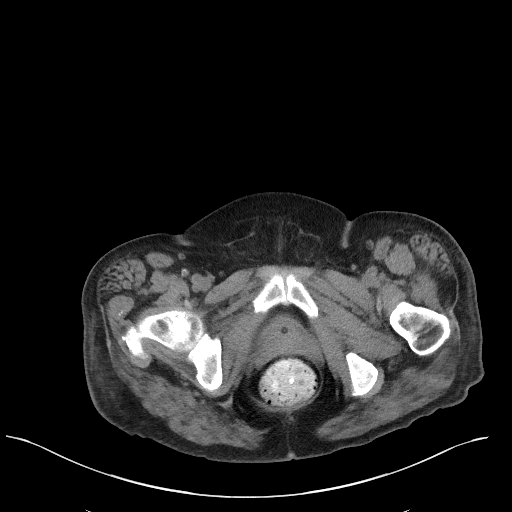
[im 10/119  lung]
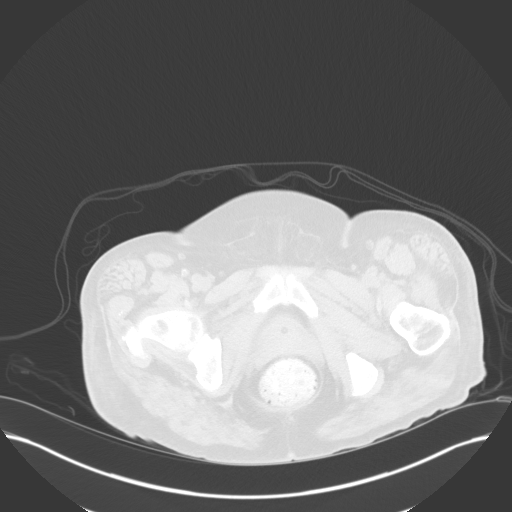
[im 20/119  lung]
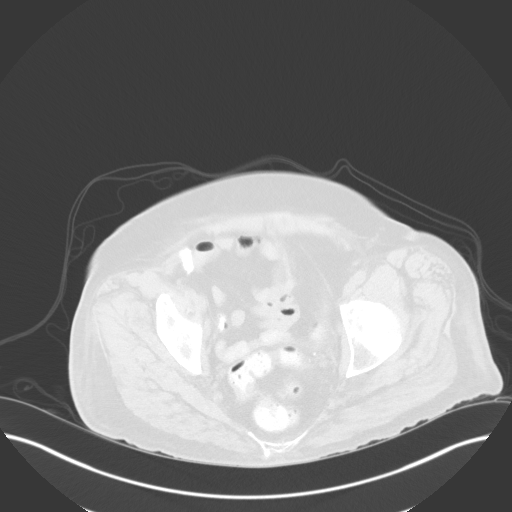
[im 30/119  lung]
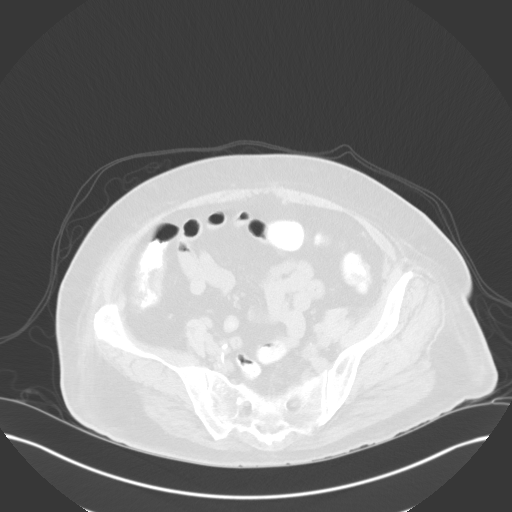
[im 40/119  lung]
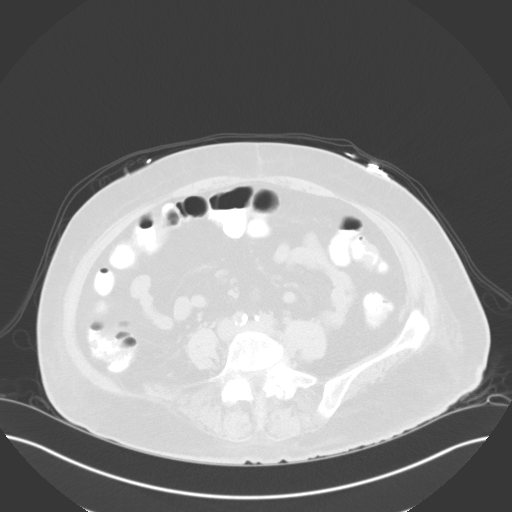
[im 50/119  mediastinal]
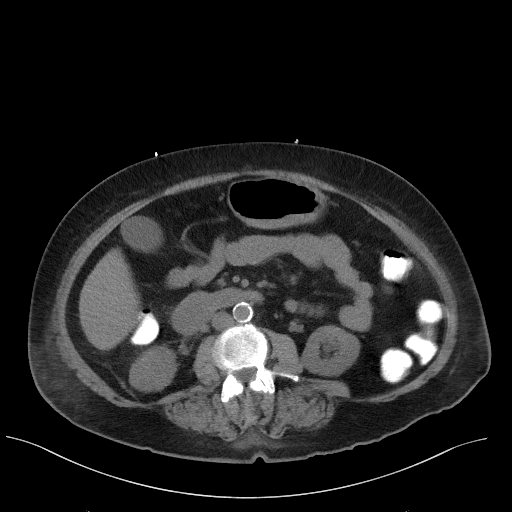
[im 50/119  lung]
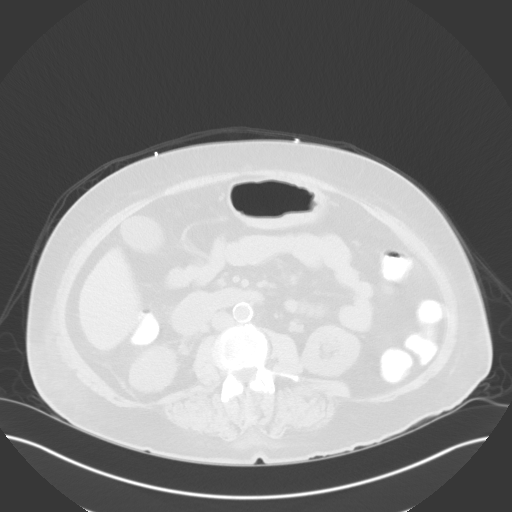
[im 69/119  lung]
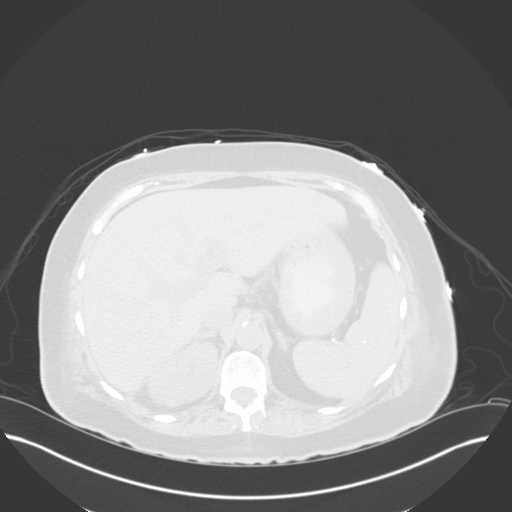
[im 79/119  lung]
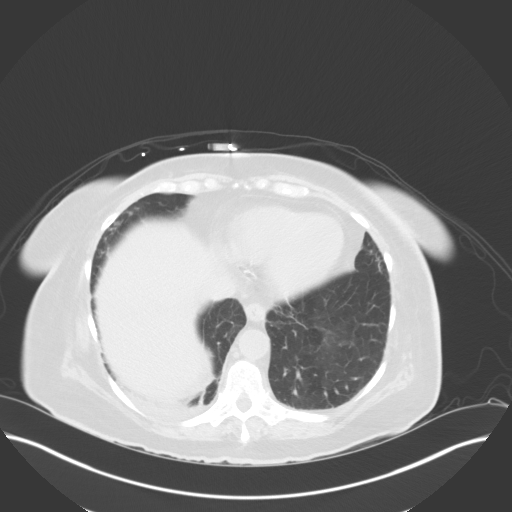
[im 89/119  lung]
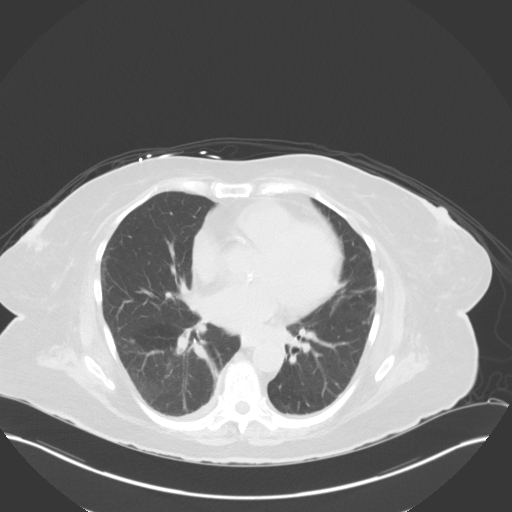
[im 99/119  mediastinal]
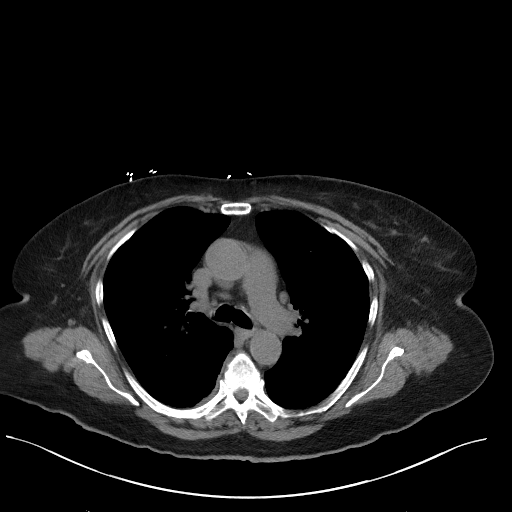
[im 99/119  lung]
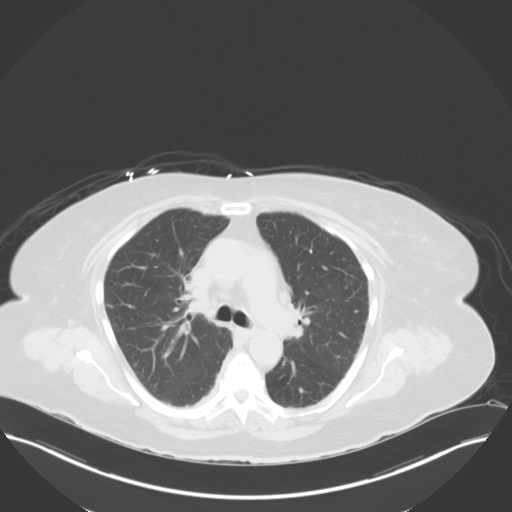
[im 109/119  lung]
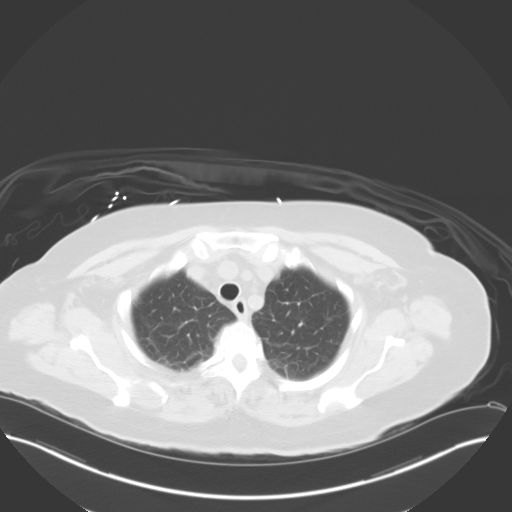

[Series 5: coronals · coronal · 0.77mm/px · 3 of 131 slices shown]
[im 27/131  lung]
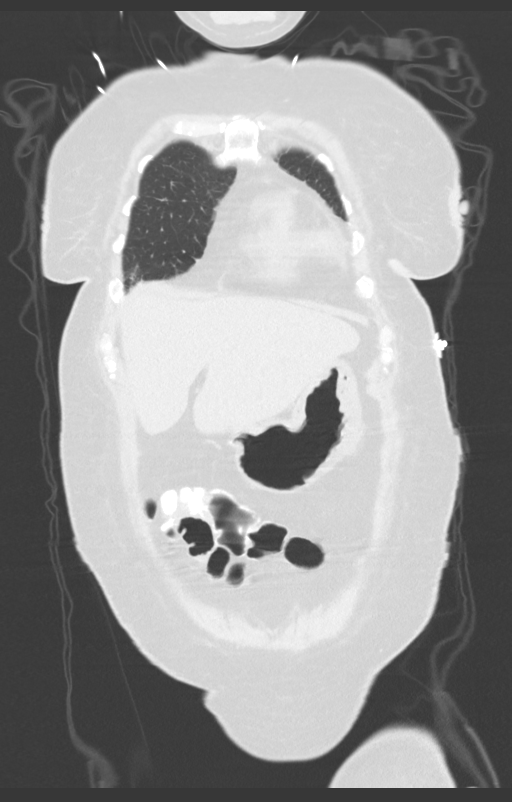
[im 53/131  lung]
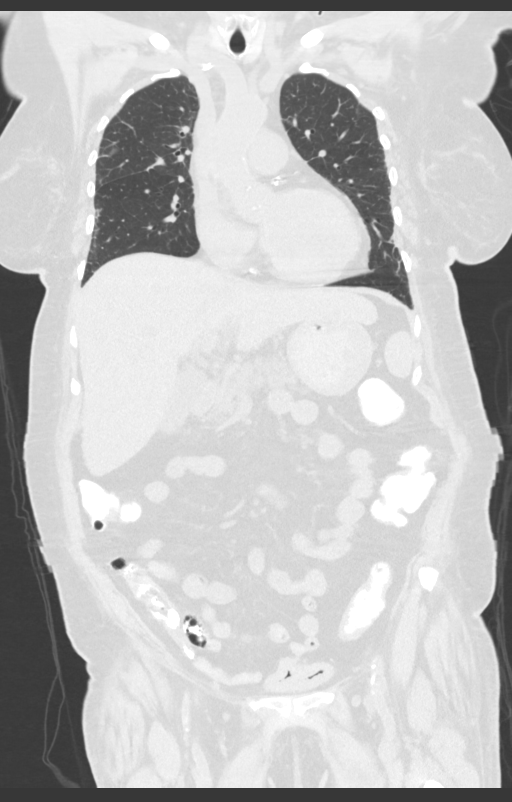
[im 79/131  lung]
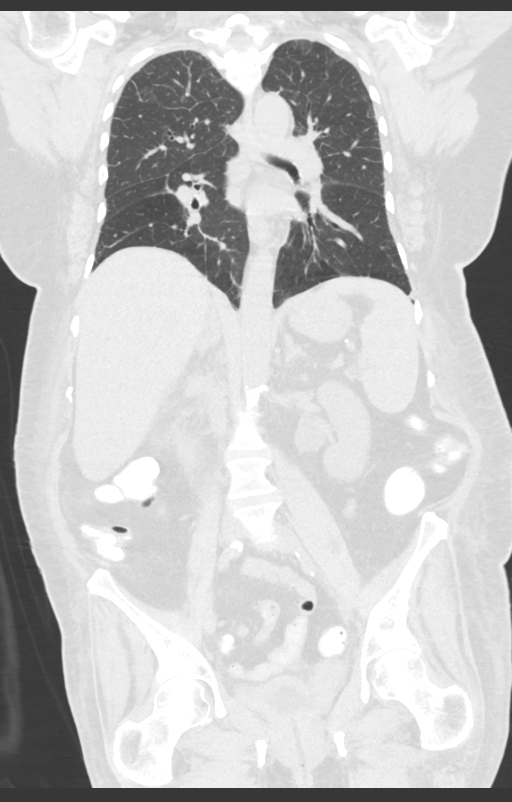

[13 of 36 positions shown; findings below may reference images not displayed]

FINDINGS: CT CHEST FINDINGS

Cardiovascular: Aortic atherosclerosis. Normal heart size.
Three-vessel coronary artery calcifications. No pericardial
effusion.

Mediastinum/Nodes: No enlarged mediastinal, hilar, or axillary lymph
nodes. Calcified mediastinal and left hilar lymph nodes thyroid
gland, trachea, and esophagus demonstrate no significant findings.

Lungs/Pleura: Trace bilateral pleural effusions. Interlobular septal
thickening. Scattered, nonspecific ground-glass. Definitively benign
calcified nodule of the left pulmonary apex (series 4, image 30).

Musculoskeletal: No chest wall mass or suspicious bone lesions
identified.

CT ABDOMEN PELVIS FINDINGS

Hepatobiliary: No solid liver abnormality is seen. Faintly calcified
gallstone in the dependent gallbladder (series 2, image 57).
Gallbladder wall thickening, or biliary dilatation.

Pancreas: Unremarkable. No pancreatic ductal dilatation or
surrounding inflammatory changes.

Spleen: Normal in size without significant abnormality.

Adrenals/Urinary Tract: Adrenal glands are unremarkable. Mild
bilateral hydronephrosis and hydroureter, substantially improved
compared to prior examination, without obstructing calculi
identified. Small, nonobstructive left renal calculus (series 2,
image 62). Foley catheter decompresses the thickened urinary bladder
(series 6, image 104).

Stomach/Bowel: Stomach is within normal limits. Appendix appears
normal. No evidence of bowel wall thickening, distention, or
inflammatory changes.

Vascular/Lymphatic: Aortic atherosclerosis. No enlarged abdominal or
pelvic lymph nodes.

Reproductive: Status post hysterectomy.

Other: No abdominal wall hernia or abnormality. No abdominopelvic
ascites.

Musculoskeletal: No acute or significant osseous findings.
IMPRESSION: 1. Mild bilateral hydronephrosis and hydroureter, substantially
improved compared to prior examination, without obstructing calculi
identified. Small, nonobstructive left renal calculus.
2. Foley catheter decompresses the thickened urinary bladder.
3. Trace bilateral pleural effusions. Interlobular septal thickening
and scattered, nonspecific ground-glass, findings generally
consistent with mild edema and/or infection.
4. Cholelithiasis without evidence of acute cholecystitis.
5. Coronary artery disease.

Aortic Atherosclerosis ([id number]).

## 2021-12-08 IMAGING — DX DG CHEST 1V PORT
1 series · 1 of 1 positions shown · non-contrast
Comparison: 01/28/2021

CLINICAL DATA: Fever, fatigue

EXAM:
PORTABLE CHEST 1 VIEW

[chest]
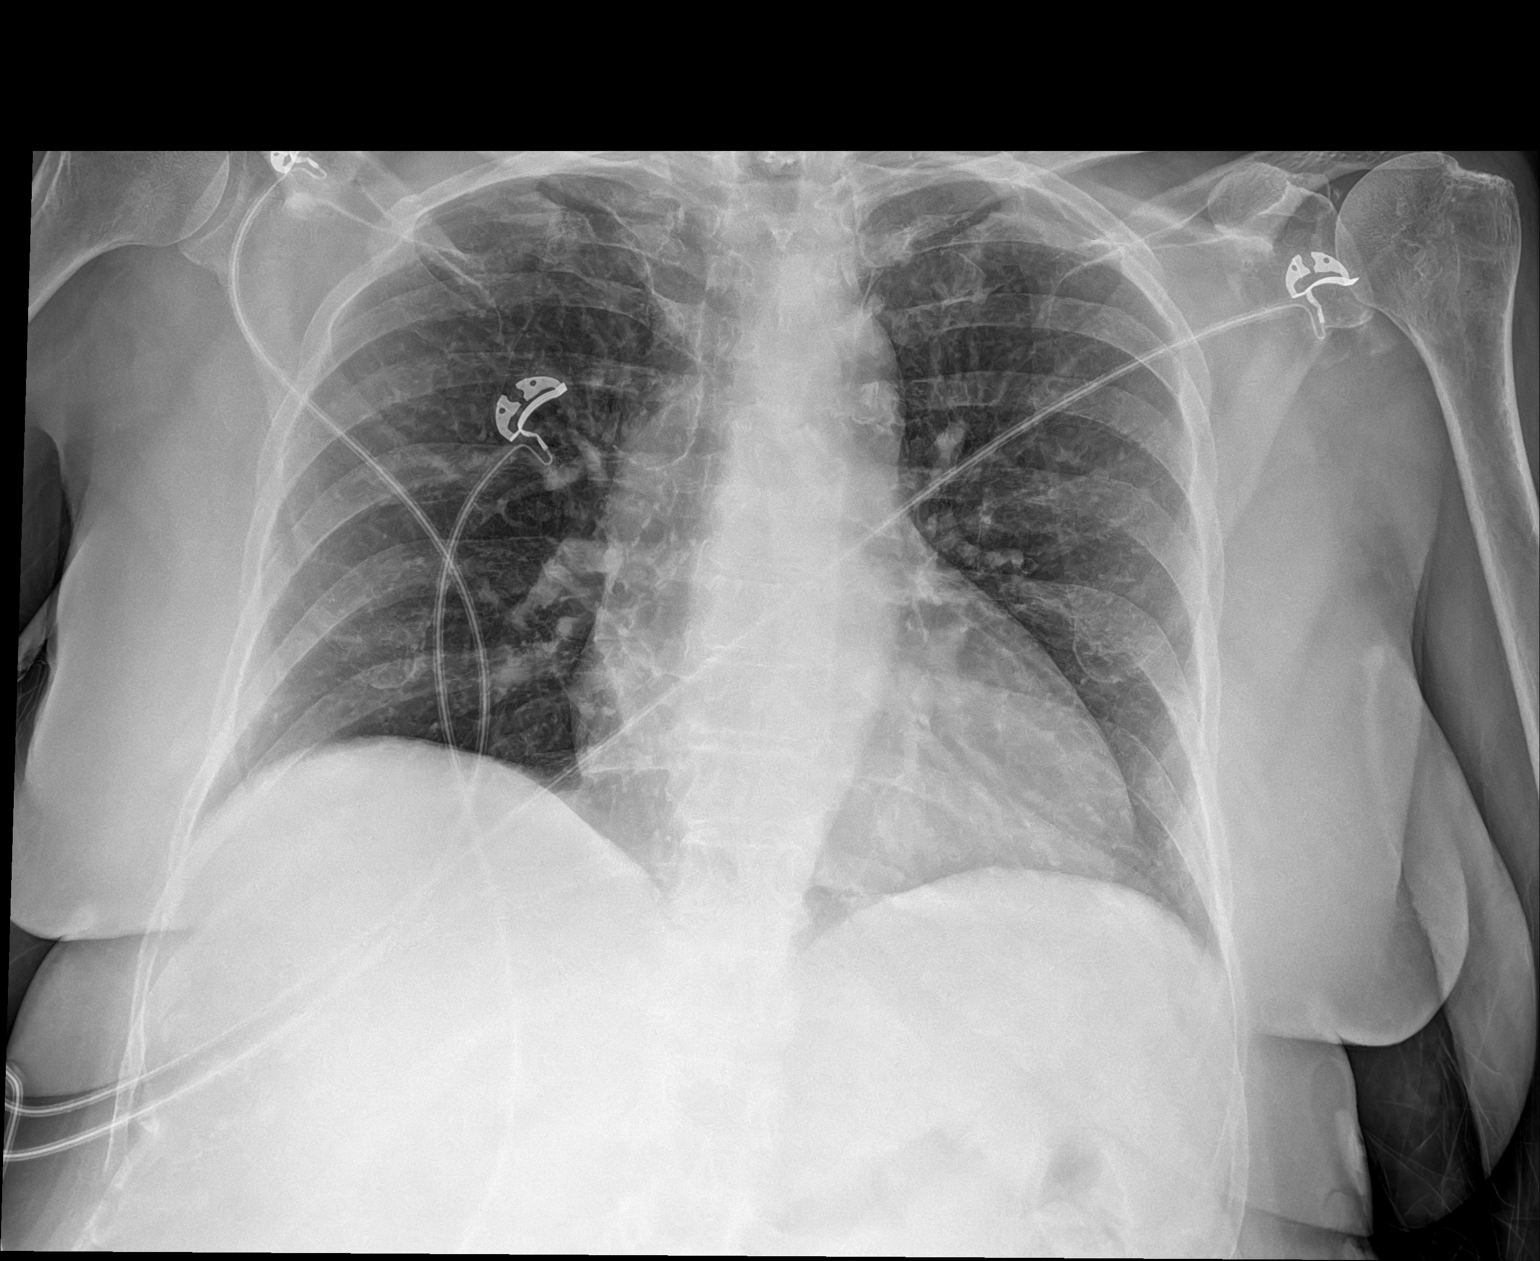

[1 of 1 positions shown; findings below may reference images not displayed]

FINDINGS: The heart size and mediastinal contours are within normal limits.
Both lungs are clear. The visualized skeletal structures are
unremarkable.
IMPRESSION: No acute abnormality of the lungs in AP portable projection.

## 2022-02-01 ENCOUNTER — Other Ambulatory Visit: Payer: Self-pay | Admitting: Gastroenterology

## 2022-02-01 DIAGNOSIS — D509 Iron deficiency anemia, unspecified: Secondary | ICD-10-CM

## 2022-02-19 ENCOUNTER — Inpatient Hospital Stay: Payer: Federal, State, Local not specified - PPO | Attending: Hematology and Oncology

## 2022-02-19 ENCOUNTER — Other Ambulatory Visit: Payer: Self-pay

## 2022-02-19 DIAGNOSIS — Z87440 Personal history of urinary (tract) infections: Secondary | ICD-10-CM | POA: Insufficient documentation

## 2022-02-19 DIAGNOSIS — D509 Iron deficiency anemia, unspecified: Secondary | ICD-10-CM | POA: Insufficient documentation

## 2022-02-19 LAB — CBC WITH DIFFERENTIAL (CANCER CENTER ONLY)
Abs Immature Granulocytes: 0.05 10*3/uL (ref 0.00–0.07)
Basophils Absolute: 0 10*3/uL (ref 0.0–0.1)
Basophils Relative: 1 %
Eosinophils Absolute: 0.1 10*3/uL (ref 0.0–0.5)
Eosinophils Relative: 4 %
HCT: 32.2 % — ABNORMAL LOW (ref 36.0–46.0)
Hemoglobin: 10.6 g/dL — ABNORMAL LOW (ref 12.0–15.0)
Immature Granulocytes: 1 %
Lymphocytes Relative: 26 %
Lymphs Abs: 0.9 10*3/uL (ref 0.7–4.0)
MCH: 27.2 pg (ref 26.0–34.0)
MCHC: 32.9 g/dL (ref 30.0–36.0)
MCV: 82.8 fL (ref 80.0–100.0)
Monocytes Absolute: 0.3 10*3/uL (ref 0.1–1.0)
Monocytes Relative: 10 %
Neutro Abs: 2 10*3/uL (ref 1.7–7.7)
Neutrophils Relative %: 58 %
Platelet Count: 264 10*3/uL (ref 150–400)
RBC: 3.89 MIL/uL (ref 3.87–5.11)
RDW: 13.6 % (ref 11.5–15.5)
WBC Count: 3.5 10*3/uL — ABNORMAL LOW (ref 4.0–10.5)
nRBC: 0 % (ref 0.0–0.2)

## 2022-02-19 LAB — FERRITIN: Ferritin: 21 ng/mL (ref 11–307)

## 2022-02-19 LAB — FOLATE: Folate: 13.5 ng/mL (ref >5.9)

## 2022-02-19 LAB — IRON AND IRON BINDING CAPACITY (CC-WL,HP ONLY)
Iron: 36 ug/dL (ref 28–170)
Saturation Ratios: 7 % — ABNORMAL LOW (ref 10.4–31.8)
TIBC: 545 ug/dL — ABNORMAL HIGH (ref 250–450)
UIBC: 509 ug/dL — ABNORMAL HIGH (ref 148–442)

## 2022-02-19 LAB — VITAMIN B12: Vitamin B-12: 739 pg/mL (ref 180–914)

## 2022-02-20 NOTE — Progress Notes (Signed)
HEMATOLOGY-ONCOLOGY TELEPHONE VISIT PROGRESS NOTE  I connected with our patient on 02/21/22 at  9:00 AM EDT by telephone and verified that I am speaking with the correct person using two identifiers.  I discussed the limitations, risks, security and privacy concerns of performing an evaluation and management service by telephone and the availability of in person appointments.  I also discussed with the patient that there may be a patient responsible charge related to this service. The patient expressed understanding and agreed to proceed.   History of Present Illness: Shirley Juarez is a 71 y.o. female with above-mentioned history of iron deficiency anemia. She presents to the clinic today for via  telephone follow-up.  REVIEW OF SYSTEMS:   Constitutional: Denies fevers, chills or abnormal weight loss All other systems were reviewed with the patient and are negative. Observations/Objective:     Assessment Plan:  Iron deficiency anemia Iron deficiency anemia: Discovered during her hospitalization in August 2022 (she was hospitalized for severe UTIs) hemoglobin was 6.9 MCV 72 (1 unit of PRBC and IV iron x1 dose) Upper endoscopy and colonoscopy performed November 2022: No evidence of bleeding   IV Iron: Dec 2022, Mar 2023    Lab Review: 08/02/21: WBC: 3.5, Hb 11.3, Ferritin: 20, Iron Sat: 7%, TIBC: 553 11/14/21: Ferritin 103, Hb 12.3, Sat: 11% 02/19/2022: Ferritin 21, iron saturation 7%, TIBC 545, B12 739, hemoglobin 10.6, folate 13.5   We discussed the pros and cons of giving IV iron at this time.  Iron low iron saturation and high TIBC with hemoglobin of 10.6 seem to suggest that she could benefit from intravenous iron therapy.     I discussed the assessment and treatment plan with the patient. The patient was provided an opportunity to ask questions and all were answered. The patient agreed with the plan and demonstrated an understanding of the instructions. The patient was advised to call  back or seek an in-person evaluation if the symptoms worsen or if the condition fails to improve as anticipated.   I provided 12 minutes of non-face-to-face time during this encounter.  This includes time for charting and coordination of care   Harriette Ohara, MD  I Gardiner Coins am scribing for Dr. Lindi Adie  I have reviewed the above documentation for accuracy and completeness, and I agree with the above.

## 2022-02-21 ENCOUNTER — Inpatient Hospital Stay (HOSPITAL_BASED_OUTPATIENT_CLINIC_OR_DEPARTMENT_OTHER): Payer: Federal, State, Local not specified - PPO | Admitting: Hematology and Oncology

## 2022-02-21 DIAGNOSIS — D509 Iron deficiency anemia, unspecified: Secondary | ICD-10-CM | POA: Diagnosis not present

## 2022-02-21 NOTE — Assessment & Plan Note (Signed)
Iron deficiency anemia: Discovered during her hospitalization in August 2022 (she was hospitalized for severe UTIs) hemoglobin was 6.9 MCV 72 (1 unit of PRBC and IV iron x1 dose) Upper endoscopy and colonoscopy performed November 2022: No evidence of bleeding  IV Iron: Dec 2022, Mar 2023  Lab Review: 08/02/21: WBC: 3.5, Hb 11.3, Ferritin: 20, Iron Sat: 7%, TIBC: 553 11/14/21: Ferritin 103, Hb 12.3, Sat: 11% 02/19/2022: Ferritin 21, iron saturation 7%, TIBC 545, B12 739, hemoglobin 10.6, folate 13.5  We discussed the pros and cons of giving IV iron at this time.  Iron low iron saturation and high TIBC with hemoglobin of 10.6 seem to suggest that she could benefit from intravenous iron therapy.

## 2022-02-22 ENCOUNTER — Telehealth: Payer: Self-pay | Admitting: Hematology and Oncology

## 2022-02-22 NOTE — Telephone Encounter (Signed)
Scheduled appointment per 9/20 los. Patient is aware.

## 2022-02-28 ENCOUNTER — Other Ambulatory Visit: Payer: Self-pay

## 2022-02-28 ENCOUNTER — Inpatient Hospital Stay: Payer: Federal, State, Local not specified - PPO

## 2022-02-28 VITALS — BP 150/58 | HR 89 | Temp 97.9°F | Resp 17

## 2022-02-28 DIAGNOSIS — D509 Iron deficiency anemia, unspecified: Secondary | ICD-10-CM

## 2022-02-28 MED ORDER — SODIUM CHLORIDE 0.9 % IV SOLN
300.0000 mg | Freq: Once | INTRAVENOUS | Status: AC
  Administered 2022-02-28: 300 mg via INTRAVENOUS
  Filled 2022-02-28: qty 300

## 2022-02-28 MED ORDER — SODIUM CHLORIDE 0.9 % IV SOLN: INTRAVENOUS | Status: DC

## 2022-02-28 NOTE — Progress Notes (Signed)
Pt tolerated treatment well with no c/o.  VSS, pt discharged to lobby in stable condition, declined to stay for 30 min obv

## 2022-03-08 ENCOUNTER — Inpatient Hospital Stay: Payer: Federal, State, Local not specified - PPO | Attending: Hematology and Oncology

## 2022-03-08 ENCOUNTER — Other Ambulatory Visit: Payer: Self-pay

## 2022-03-08 VITALS — BP 166/68 | HR 77 | Temp 98.6°F | Resp 17

## 2022-03-08 DIAGNOSIS — D509 Iron deficiency anemia, unspecified: Secondary | ICD-10-CM | POA: Insufficient documentation

## 2022-03-08 MED ORDER — SODIUM CHLORIDE 0.9 % IV SOLN
300.0000 mg | Freq: Once | INTRAVENOUS | Status: AC
  Administered 2022-03-08: 300 mg via INTRAVENOUS
  Filled 2022-03-08: qty 300

## 2022-03-08 MED ORDER — SODIUM CHLORIDE 0.9 % IV SOLN: INTRAVENOUS | Status: DC

## 2022-03-08 NOTE — Patient Instructions (Signed)

## 2022-03-14 ENCOUNTER — Inpatient Hospital Stay: Payer: Federal, State, Local not specified - PPO

## 2022-03-14 VITALS — BP 138/63 | HR 88 | Temp 98.3°F | Resp 17 | Ht 60.0 in | Wt 155.1 lb

## 2022-03-14 DIAGNOSIS — D509 Iron deficiency anemia, unspecified: Secondary | ICD-10-CM

## 2022-03-14 MED ORDER — SODIUM CHLORIDE 0.9 % IV SOLN: INTRAVENOUS | Status: DC

## 2022-03-14 MED ORDER — SODIUM CHLORIDE 0.9 % IV SOLN
300.0000 mg | Freq: Once | INTRAVENOUS | Status: AC
  Administered 2022-03-14: 300 mg via INTRAVENOUS
  Filled 2022-03-14: qty 300

## 2022-03-14 NOTE — Patient Instructions (Signed)

## 2022-03-26 ENCOUNTER — Encounter: Payer: Self-pay | Admitting: Cardiovascular Disease

## 2022-03-26 ENCOUNTER — Ambulatory Visit: Payer: Federal, State, Local not specified - PPO | Attending: Cardiovascular Disease | Admitting: Cardiovascular Disease

## 2022-03-26 VITALS — BP 124/68 | HR 88 | Ht 60.0 in | Wt 159.0 lb

## 2022-03-26 DIAGNOSIS — I2510 Atherosclerotic heart disease of native coronary artery without angina pectoris: Secondary | ICD-10-CM

## 2022-03-26 DIAGNOSIS — I10 Essential (primary) hypertension: Secondary | ICD-10-CM

## 2022-03-26 DIAGNOSIS — I340 Nonrheumatic mitral (valve) insufficiency: Secondary | ICD-10-CM

## 2022-03-26 NOTE — Patient Instructions (Signed)
Medication Instructions:  No changes *If you need a refill on your cardiac medications before your next appointment, please call your pharmacy*   Lab Work: none If you have labs (blood work) drawn today and your tests are completely normal, you will receive your results only by: MyChart Message (if you have MyChart) OR A paper copy in the mail If you have any lab test that is abnormal or we need to change your treatment, we will call you to review the results.   Testing/Procedures: Your physician has requested that you have an echocardiogram. Echocardiography is a painless test that uses sound waves to create images of your heart. It provides your doctor with information about the size and shape of your heart and how well your heart's chambers and valves are working. This procedure takes approximately one hour. There are no restrictions for this procedure. Please do NOT wear cologne, perfume, aftershave, or lotions (deodorant is allowed). Please arrive 15 minutes prior to your appointment time.   Follow-Up: At [facility], you and your health needs are our priority.  As part of our continuing mission to provide you with exceptional heart care, we have created designated Provider Care Teams.  These Care Teams include your primary Cardiologist (physician) and Advanced Practice Providers (APPs -  Physician Assistants and Nurse Practitioners) who all work together to provide you with the care you need, when you need it.   Your next appointment:   12 month(s)  The format for your next appointment:   In Person  Provider:   Christopher McAlhany, MD     Important Information About Sugar       

## 2022-03-26 NOTE — Progress Notes (Signed)
Chief Complaint  Patient presents with   Follow-up    CAD   History of Present Illness: 71 yo female with history of HTN, HLD, CAD, DM, anxiety and depression who is here today for cardiac follow up. I saw her 01/31/16 to re-establish care in our office. She was known to have minor CAD by cath in 2004 in [place]. Her stress test in 2014 showed no ischemia. Echo in 2014 with normal LV function, mild AI. When I saw her August 2017, she had many complaints including headache, posterior neck pain, arm pain, chest pain at rest, elevated BP at home. She was admitted to [facility] 02/06/16 with neck pain radiating to her chest. Neck MRI with cervical spine disease. Nuclear stress test 02/07/16 with no ischemia. Echo 02/07/16 with normal LV size and function, mild AI. She does not tolerate Calcium channel blockers, Toprol, clonidine, Norvasc. Cardiac monitor June 2019 with several short runs of atrial tachycardia. Echo 03/02/19 with LVEF=65-70%, mild MR.   She is here today for follow up. The patient denies any chest pain, dyspnea, palpitations, lower extremity edema, orthopnea, PND, dizziness, near syncope or syncope.   Primary Care Physician: [facility]  Past Medical History:  Diagnosis Date   Anxiety    Arthritis    Osteoarthritis- knee and back   Coronary artery disease    Cath 2004 in [place], [place] with minor CAD   Depression    Diabetes mellitus    On oral and diet   GERD (gastroesophageal reflux disease)    Heart murmur    Hyperlipidemia    Hypertension    Sleep apnea    does not use CPAP- it is broken.  Last sleep study was 7 years ago.  Has not worn in  4  years    Past Surgical History:  Procedure Laterality Date   ABDOMINAL HYSTERECTOMY     COLONOSCOPY     Dr.Mann   KNEE ARTHROCENTESIS     TOTAL KNEE ARTHROPLASTY  06/22/2011   Bilateral     Current Outpatient Medications  Medication Sig Dispense Refill   acetaminophen (TYLENOL) 325 MG  tablet Take 325-650 mg by mouth every 6 (six) hours as needed for mild pain or headache.     ALPRAZolam (XANAX) 0.5 MG tablet Take 0.5 mg by mouth in the morning and at bedtime.     amoxicillin (AMOXIL) 500 MG capsule amoxicillin 500 mg capsule  TAKE 4 CAPSULES BY MOUTH 1 HOUR PRIOR TO DENTAL APPOINTMENT THEN 2 CAPSULES BY MOUTH AFTER APPOINTMENT     amoxicillin (AMOXIL) 500 MG capsule Take by mouth.     aspirin EC 81 MG tablet Take 81 mg by mouth at bedtime.       Blood Glucose Monitoring Suppl (ONETOUCH VERIO) w/Device KIT use to test your blood sugar     Calcium Citrate-Vitamin D (CALCIUM CITRATE + D3 PO) Take 1 tablet by mouth every Monday, Wednesday, and Friday.     docusate sodium (COLACE) 100 MG capsule Take 100 mg by mouth daily.     ferrous gluconate (FERGON) 324 MG tablet Take 1 tablet (324 mg total) by mouth daily with breakfast. 30 tablet 3   gemfibrozil (LOPID) 600 MG tablet Take 600 mg by mouth 2 (two) times daily before a meal.      glucose blood (ONETOUCH VERIO) test strip use to test your blood sugar     hydrALAZINE (APRESOLINE) 100 MG tablet TAKE ONE TABLET BY MOUTH  THREE TIMES A DAY 270 tablet 1   hydrOXYzine (ATARAX/VISTARIL) 25 MG tablet Take 25 mg by mouth at bedtime.       lansoprazole (PREVACID) 30 MG capsule TAKE ONE CAPSULE BY MOUTH TWICE A DAY BEFORE MEAL 60 capsule 4   metFORMIN (GLUCOPHAGE) 500 MG tablet Take 1,000 mg by mouth daily with breakfast.     ondansetron (ZOFRAN) 4 MG tablet Take 1 tablet (4 mg total) by mouth as directed. Take one Zofran pill 30-60 minutes before each colonoscopy prep dose 2 tablet 0   tamsulosin (FLOMAX) 0.4 MG CAPS capsule Take 1 capsule (0.4 mg total) by mouth daily after supper. 30 capsule 2   venlafaxine XR (EFFEXOR-XR) 150 MG 24 hr capsule Take 150 mg by mouth at bedtime.     Current Facility-Administered Medications  Medication Dose Route Frequency Provider Last Rate Last Admin   0.9 %  sodium chloride infusion  500 mL Intravenous  Once Mansouraty, Telford Nab., MD        Allergies  Allergen Reactions   Latex Rash   Codeine Nausea And Vomiting   Covid-19 Mrna Vacc (Moderna) Hives    whole body hives, trouble breathing, imbalance. falls within 24 h   Crestor [Rosuvastatin Calcium] Nausea And Vomiting   Fish Oil Nausea And Vomiting   Hydralazine Other (See Comments) and Cough    100 mg three times a day causes excessive coughing   Lipitor [Atorvastatin Calcium] Nausea And Vomiting   Peanut-Containing Drug Products Hives   Statins Nausea And Vomiting   Welchol [Colesevelam Hcl] Nausea And Vomiting   Zithromax [Azithromycin] Nausea And Vomiting   Other Other (See Comments)    Pet dander    Social History   Socioeconomic History   Marital status: Married    Spouse name: Not on file   Number of children: 2   Years of education: Not on file   Highest education level: Not on file  Occupational History   Occupation: Retired Animal nutritionist OB/GYN  Tobacco Use   Smoking status: Never   Smokeless tobacco: Never  Vaping Use   Vaping Use: Never used  Substance and Sexual Activity   Alcohol use: No   Drug use: Not Currently   Sexual activity: Yes    Birth control/protection: Surgical  Other Topics Concern   Not on file  Social History Narrative   Not on file   Social Determinants of Health   Financial Resource Strain: Not on file  Food Insecurity: Not on file  Transportation Needs: Not on file  Physical Activity: Not on file  Stress: Not on file  Social Connections: Not on file  Intimate Partner Violence: Not on file    Family History  Problem Relation Age of Onset   Heart attack Mother 24   Liver disease Father    Heart attack Son        Age 81, he is our patient   Anesthesia problems Neg Hx    Colon cancer Neg Hx    Esophageal cancer Neg Hx    Stomach cancer Neg Hx     Review of Systems:  As stated in the HPI and otherwise negative.   BP 124/68   Pulse 88   Ht 5' (1.524 m)   Wt 159 lb (72.1  kg)   SpO2 98%   BMI 31.05 kg/m   Physical Examination: General: Well developed, well nourished, NAD  HEENT: OP clear, mucus membranes moist  SKIN: warm, dry. No rashes. Neuro: No focal  deficits  Musculoskeletal: Muscle strength 5/5 all ext  Psychiatric: Mood and affect normal  Neck: No JVD, no carotid bruits, no thyromegaly, no lymphadenopathy.  Lungs:Clear bilaterally, no wheezes, rhonci, crackles Cardiovascular: Regular rate and rhythm. Systolic murmur.  Abdomen:Soft. Bowel sounds present. Non-tender.  Extremities: No lower extremity edema. Pulses are 2 + in the bilateral DP/PT.  Echo 03/02/19:  1. Left ventricular ejection fraction, by visual estimation, is 65 to  70%. The left ventricle has normal function. Normal left ventricular size.  There is no left ventricular hypertrophy.   2. Global right ventricle has normal systolic function.The right  ventricular size is normal. No increase in right ventricular wall  thickness.   3. The mitral valve is normal in structure. Mild mitral valve  regurgitation.   EKG:  EKG is  ordered today. The ekg ordered today demonstrates NSR. Rate 88 bpm  Recent Labs: 02/19/2022: Hemoglobin 10.6; Platelet Count 264   Lipid Panel No results found for: "CHOL", "TRIG", "HDL", "CHOLHDL", "VLDL", "LDLCALC", "LDLDIRECT"   Wt Readings from Last 3 Encounters:  03/26/22 159 lb (72.1 kg)  03/14/22 155 lb 1.9 oz (70.4 kg)  05/01/21 142 lb 6.4 oz (64.6 kg)     Assessment and Plan:   1. CAD without angina:  She is known to have mild CAD by cath in 2004. Nuclear stress test September 2017 with no ischemia. Echo September 2020 with normal LV function. She has no chest pain. Will continue ASA. She does not tolerate statins.    2. Aortic insufficiency/mitral regurgitation: No significant AI by echo September 2020. Mild MR by echo in 2020. Will repeat echo now.   3. HTN: BP is controlled. No changes  4. History of Atrial tachycardia: She has no  palpitations.   Labs/ tests ordered today include:   Orders Placed This Encounter  Procedures   EKG 12-Lead   ECHOCARDIOGRAM COMPLETE   Disposition:   F/U with me in 12 months  Signed, Lauree Chandler, MD 03/26/2022 2:48 PM    [address], [place], [place]  [address] Phone: [phone]; Fax: [phone]

## 2022-04-12 ENCOUNTER — Ambulatory Visit (HOSPITAL_COMMUNITY): Payer: Federal, State, Local not specified - PPO | Attending: Cardiology

## 2022-04-12 DIAGNOSIS — I340 Nonrheumatic mitral (valve) insufficiency: Secondary | ICD-10-CM | POA: Diagnosis present

## 2022-04-12 LAB — ECHOCARDIOGRAM COMPLETE
AR max vel: 2.23 cm2
AV Area VTI: 2.04 cm2
AV Area mean vel: 2.08 cm2
AV Mean grad: 13 mmHg
AV Peak grad: 23.3 mmHg
Ao pk vel: 2.42 m/s
Area-P 1/2: 3.46 cm2
P 1/2 time: 526 msec
S' Lateral: 2.6 cm

## 2022-06-25 ENCOUNTER — Other Ambulatory Visit: Payer: Self-pay

## 2022-06-25 ENCOUNTER — Inpatient Hospital Stay: Payer: Federal, State, Local not specified - PPO | Attending: Hematology and Oncology

## 2022-06-25 DIAGNOSIS — D509 Iron deficiency anemia, unspecified: Secondary | ICD-10-CM | POA: Diagnosis present

## 2022-06-25 DIAGNOSIS — E1122 Type 2 diabetes mellitus with diabetic chronic kidney disease: Secondary | ICD-10-CM | POA: Insufficient documentation

## 2022-06-25 DIAGNOSIS — N183 Chronic kidney disease, stage 3 unspecified: Secondary | ICD-10-CM | POA: Insufficient documentation

## 2022-06-25 DIAGNOSIS — I129 Hypertensive chronic kidney disease with stage 1 through stage 4 chronic kidney disease, or unspecified chronic kidney disease: Secondary | ICD-10-CM | POA: Diagnosis not present

## 2022-06-25 LAB — IRON AND IRON BINDING CAPACITY (CC-WL,HP ONLY)
Iron: 44 ug/dL (ref 28–170)
Saturation Ratios: 8 % — ABNORMAL LOW (ref 10.4–31.8)
TIBC: 531 ug/dL — ABNORMAL HIGH (ref 250–450)
UIBC: 487 ug/dL — ABNORMAL HIGH (ref 148–442)

## 2022-06-25 LAB — CBC WITH DIFFERENTIAL (CANCER CENTER ONLY)
Abs Immature Granulocytes: 0.04 10*3/uL (ref 0.00–0.07)
Basophils Absolute: 0 10*3/uL (ref 0.0–0.1)
Basophils Relative: 1 %
Eosinophils Absolute: 0.1 10*3/uL (ref 0.0–0.5)
Eosinophils Relative: 4 %
HCT: 33 % — ABNORMAL LOW (ref 36.0–46.0)
Hemoglobin: 10.9 g/dL — ABNORMAL LOW (ref 12.0–15.0)
Immature Granulocytes: 1 %
Lymphocytes Relative: 27 %
Lymphs Abs: 0.9 10*3/uL (ref 0.7–4.0)
MCH: 27.1 pg (ref 26.0–34.0)
MCHC: 33 g/dL (ref 30.0–36.0)
MCV: 82.1 fL (ref 80.0–100.0)
Monocytes Absolute: 0.3 10*3/uL (ref 0.1–1.0)
Monocytes Relative: 10 %
Neutro Abs: 1.9 10*3/uL (ref 1.7–7.7)
Neutrophils Relative %: 57 %
Platelet Count: 289 10*3/uL (ref 150–400)
RBC: 4.02 MIL/uL (ref 3.87–5.11)
RDW: 14.2 % (ref 11.5–15.5)
WBC Count: 3.3 10*3/uL — ABNORMAL LOW (ref 4.0–10.5)
nRBC: 0 % (ref 0.0–0.2)

## 2022-06-25 LAB — FERRITIN: Ferritin: 33 ng/mL (ref 11–307)

## 2022-06-26 NOTE — Assessment & Plan Note (Signed)
Iron deficiency anemia: Discovered during her hospitalization in August 2022 (she was hospitalized for severe UTIs) hemoglobin was 6.9 MCV 72 (1 unit of PRBC and IV iron x1 dose) Upper endoscopy and colonoscopy performed November 2022: No evidence of bleeding   IV Iron: Dec 2022, Mar 2023    Lab Review: 08/02/21: WBC: 3.5, Hb 11.3, Ferritin: 20, Iron Sat: 7%, TIBC: 553 11/14/21: Ferritin 103, Hb 12.3, Sat: 11% 02/19/2022: Ferritin 21, iron saturation 7%, TIBC 545, B12 739, hemoglobin 10.6, folate 13.5 06/25/22: Hb 10.9, MCV 82, Iron Sat 8%, TIBC 487, Ferritin 33  We can watch and monitor without IV Iron therapy at this time

## 2022-06-27 ENCOUNTER — Inpatient Hospital Stay (HOSPITAL_BASED_OUTPATIENT_CLINIC_OR_DEPARTMENT_OTHER): Payer: Federal, State, Local not specified - PPO | Admitting: Hematology and Oncology

## 2022-06-27 DIAGNOSIS — D509 Iron deficiency anemia, unspecified: Secondary | ICD-10-CM

## 2022-06-27 NOTE — Progress Notes (Signed)
[facility] up:  TELEPHONE VISIT  Williams, Breejante J, PA-C [name] [place] [address]   CURRENT THERAPY: Telephone visit follow-up to discuss the results of recently performed blood work.    INTERVAL HISTORY: Shirley Juarez 72 y.o. female returns for follow-up of iron deficiency anemia.  Her last iron infusion was in March 2023.  Patient reports that she does feel mildly tired but overall has been feeling much better.  Denies any cravings for ice chips.   Patient Active Problem List   Diagnosis Date Noted   Iron deficiency anemia 05/01/2021   Hyponatremia 01/28/2021   Hypomagnesemia 01/28/2021   Microcytic anemia 01/28/2021   Prolonged QT interval 01/28/2021   CKD (chronic kidney disease) stage 3, GFR 30-59 ml/min (HCC) 01/28/2021   Generalized weakness 01/28/2021   Depression with anxiety 01/28/2021   ARF (acute renal failure) ([facility]) 01/12/2021   Type 2 diabetes mellitus with hemoglobin A1c goal of less than 7.5% (HCC)    Headache    Hypokalemia    Pain in the chest 02/06/2016   Hypercalcemia 02/06/2016   GERD (gastroesophageal reflux disease) 02/06/2016   Coronary atherosclerosis of native coronary artery 09/04/2012   Essential hypertension 09/04/2012   Hyperlipidemia 09/04/2012    is allergic to latex, codeine, covid-19 mrna vacc (moderna), crestor [rosuvastatin calcium], fish oil, hydralazine, lipitor [atorvastatin calcium], peanut-containing drug products, statins, welchol [colesevelam hcl], zithromax [azithromycin], and other.  MEDICAL HISTORY: Past Medical History:  Diagnosis Date   Anxiety    Arthritis    Osteoarthritis- knee and back   Coronary artery disease    Cath 2004 in [place], [place] with minor CAD   Depression    Diabetes mellitus    On oral and diet   GERD (gastroesophageal reflux disease)    Heart murmur    Hyperlipidemia    Hypertension    Sleep apnea    does not use CPAP- it is broken.  Last sleep  study was 7 years ago.  Has not worn in  4  years    SURGICAL HISTORY: Past Surgical History:  Procedure Laterality Date   ABDOMINAL HYSTERECTOMY     COLONOSCOPY     Dr.Mann   KNEE ARTHROCENTESIS     TOTAL KNEE ARTHROPLASTY  06/22/2011   Bilateral     SOCIAL HISTORY: Social History   Socioeconomic History   Marital status: Married    Spouse name: Not on file   Number of children: 2   Years of education: Not on file   Highest education level: Not on file  Occupational History   Occupation: Retired Animal nutritionist OB/GYN  Tobacco Use   Smoking status: Never   Smokeless tobacco: Never  Vaping Use   Vaping Use: Never used  Substance and Sexual Activity   Alcohol use: No   Drug use: Not Currently   Sexual activity: Yes    Birth control/protection: Surgical  Other Topics Concern   Not on file  Social History Narrative   Not on file   Social Determinants of Health   Financial Resource Strain: Not on file  Food Insecurity: Not on file  Transportation Needs: Not on file  Physical Activity: Not on file  Stress: Not on file  Social Connections: Not on file  Intimate Partner Violence: Not on file    FAMILY HISTORY: Family History  Problem Relation Age of Onset   Heart attack Mother 9   Liver disease Father    Heart attack Son  Age 5, he is our patient   Anesthesia problems Neg Hx    Colon cancer Neg Hx    Esophageal cancer Neg Hx    Stomach cancer Neg Hx     Review of Systems - Oncology    PHYSICAL EXAMINATION  ECOG PERFORMANCE STATUS: 1 - Symptomatic but completely ambulatory     LABORATORY DATA:  CBC    Component Value Date/Time   WBC 3.3 (L) 06/25/2022 1220   WBC 3.4 (L) 04/11/2021 1633   RBC 4.02 06/25/2022 1220   HGB 10.9 (L) 06/25/2022 1220   HCT 33.0 (L) 06/25/2022 1220   PLT 289 06/25/2022 1220   MCV 82.1 06/25/2022 1220   MCH 27.1 06/25/2022 1220   MCHC 33.0 06/25/2022 1220   RDW 14.2 06/25/2022 1220   LYMPHSABS 0.9 06/25/2022 1220    MONOABS 0.3 06/25/2022 1220   EOSABS 0.1 06/25/2022 1220   BASOSABS 0.0 06/25/2022 1220    CMP     Component Value Date/Time   NA 124 (L) 02/13/2021 1758   K 3.7 02/13/2021 1758   CL 91 (L) 02/13/2021 1758   CO2 19 (L) 02/13/2021 1758   GLUCOSE 140 (H) 02/13/2021 1758   BUN 16 02/13/2021 1758   CREATININE 0.91 02/13/2021 1758   CALCIUM 9.3 02/13/2021 1758   PROT 6.9 02/13/2021 1758   ALBUMIN 3.6 02/13/2021 1758   AST 14 (L) 02/13/2021 1758   ALT 12 02/13/2021 1758   ALKPHOS 71 02/13/2021 1758   BILITOT 0.5 02/13/2021 1758   GFRNONAA >60 02/13/2021 1758   GFRAA >60 02/07/2016 0257    ASSESSMENT and THERAPY PLAN:   Iron deficiency anemia Iron deficiency anemia: Discovered during her hospitalization in August 2022 (she was hospitalized for severe UTIs) hemoglobin was 6.9 MCV 72 (1 unit of PRBC and IV iron x1 dose) Upper endoscopy and colonoscopy performed November 2022: No evidence of bleeding   IV Iron: Dec 2022, Mar 2023    Lab Review: 08/02/21: WBC: 3.5, Hb 11.3, Ferritin: 20, Iron Sat: 7%, TIBC: 553 11/14/21: Ferritin 103, Hb 12.3, Sat: 11% 02/19/2022: Ferritin 21, iron saturation 7%, TIBC 545, B12 739, hemoglobin 10.6, folate 13.5 06/25/22: Hb 10.9, MCV 82, Iron Sat 8%, TIBC 487, Ferritin 33  We can watch and monitor without IV Iron therapy at this time Recheck labs in 4 months and telephone visit after that to discuss results  No orders of the defined types were placed in this encounter.   All questions were answered. The patient knows to call the clinic with any problems, questions or concerns. We can certainly see the patient much sooner if necessary. This note was electronically signed. Harriette Ohara, MD 06/27/2022

## 2022-07-10 ENCOUNTER — Telehealth: Payer: Self-pay | Admitting: Pharmacy Technician

## 2022-07-10 NOTE — Telephone Encounter (Signed)
Patient Advocate Encounter  Received notification from [facility] that prior authorization for LANSOPRAZOLE 30MG  is required.   PA submitted on 2.6.24 Key [id number] Status is pending

## 2022-07-12 ENCOUNTER — Telehealth: Payer: Self-pay | Admitting: Hematology and Oncology

## 2022-07-12 NOTE — Telephone Encounter (Signed)
Scheduled appointment per 1/24 los. Patient is aware of the made appointment.

## 2022-07-13 NOTE — Telephone Encounter (Signed)
Patient Advocate Encounter  Prior Authorization for LANSOPRAZOLE 30MG has been approved.    PA#  [id number] Effective dates: 1.7.24 through 2.5.25

## 2022-10-02 ENCOUNTER — Other Ambulatory Visit: Payer: Self-pay | Admitting: Cardiovascular Disease

## 2022-10-02 ENCOUNTER — Other Ambulatory Visit: Payer: Self-pay | Admitting: Gastroenterology

## 2022-10-02 DIAGNOSIS — D509 Iron deficiency anemia, unspecified: Secondary | ICD-10-CM

## 2022-10-15 ENCOUNTER — Other Ambulatory Visit: Payer: Self-pay

## 2022-10-15 DIAGNOSIS — D509 Iron deficiency anemia, unspecified: Secondary | ICD-10-CM

## 2022-10-17 ENCOUNTER — Other Ambulatory Visit: Payer: Self-pay

## 2022-10-17 ENCOUNTER — Inpatient Hospital Stay: Payer: Federal, State, Local not specified - PPO | Attending: Hematology and Oncology

## 2022-10-17 DIAGNOSIS — D509 Iron deficiency anemia, unspecified: Secondary | ICD-10-CM | POA: Insufficient documentation

## 2022-10-17 LAB — CBC WITH DIFFERENTIAL (CANCER CENTER ONLY)
Abs Immature Granulocytes: 0.04 10*3/uL (ref 0.00–0.07)
Basophils Absolute: 0 10*3/uL (ref 0.0–0.1)
Basophils Relative: 1 %
Eosinophils Absolute: 0.1 10*3/uL (ref 0.0–0.5)
Eosinophils Relative: 3 %
HCT: 25 % — ABNORMAL LOW (ref 36.0–46.0)
Hemoglobin: 7.4 g/dL — ABNORMAL LOW (ref 12.0–15.0)
Immature Granulocytes: 1 %
Lymphocytes Relative: 23 %
Lymphs Abs: 0.7 10*3/uL (ref 0.7–4.0)
MCH: 20.3 pg — ABNORMAL LOW (ref 26.0–34.0)
MCHC: 29.6 g/dL — ABNORMAL LOW (ref 30.0–36.0)
MCV: 68.5 fL — ABNORMAL LOW (ref 80.0–100.0)
Monocytes Absolute: 0.3 10*3/uL (ref 0.1–1.0)
Monocytes Relative: 8 %
Neutro Abs: 1.9 10*3/uL (ref 1.7–7.7)
Neutrophils Relative %: 64 %
Platelet Count: 366 10*3/uL (ref 150–400)
RBC: 3.65 MIL/uL — ABNORMAL LOW (ref 3.87–5.11)
RDW: 16 % — ABNORMAL HIGH (ref 11.5–15.5)
Smear Review: NORMAL
WBC Count: 3 10*3/uL — ABNORMAL LOW (ref 4.0–10.5)
nRBC: 0 % (ref 0.0–0.2)

## 2022-10-17 LAB — IRON AND IRON BINDING CAPACITY (CC-WL,HP ONLY)
Iron: 17 ug/dL — ABNORMAL LOW (ref 28–170)
Saturation Ratios: 3 % — ABNORMAL LOW (ref 10.4–31.8)
TIBC: 633 ug/dL — ABNORMAL HIGH (ref 250–450)
UIBC: 616 ug/dL — ABNORMAL HIGH (ref 148–442)

## 2022-10-17 LAB — FERRITIN: Ferritin: 6 ng/mL — ABNORMAL LOW (ref 11–307)

## 2022-10-17 NOTE — Progress Notes (Signed)
HEMATOLOGY-ONCOLOGY TELEPHONE VISIT PROGRESS NOTE  I connected with our patient on 10/19/22 at  8:15 AM EDT by telephone and verified that I am speaking with the correct person using two identifiers.  I discussed the limitations, risks, security and privacy concerns of performing an evaluation and management service by telephone and the availability of in person appointments.  I also discussed with the patient that there may be a patient responsible charge related to this service. The patient expressed understanding and agreed to proceed.   History of Present Illness: Shirley Juarez is a 72 y.o. female with a history of iron deficiency anemia. She presents to the clinic for a telephone follow-up.  Patient has been feeling extremely tired and wiped out and short of breath to minimal exertion.  She is also feeling slightly dizzy and lightheaded.  REVIEW OF SYSTEMS:   Constitutional: Severe for fatigue and shortness of breath exertion All other systems were reviewed with the patient and are negative. Observations/Objective:     Assessment Plan:  Iron deficiency anemia Iron deficiency anemia: Discovered during her hospitalization in August 2022 (she was hospitalized for severe UTIs) hemoglobin was 6.9 MCV 72 (1 unit of PRBC and IV iron x1 dose) Upper endoscopy and colonoscopy performed November 2022: No evidence of bleeding   IV Iron: Dec 2022, Mar 2023    Lab Review: 08/02/21: WBC: 3.5, Hb 11.3, Ferritin: 20, Iron Sat: 7%, TIBC: 553 11/14/21: Ferritin 103, Hb 12.3, Sat: 11% 02/19/2022: Ferritin 21, iron saturation 7%, TIBC 545, B12 739, hemoglobin 10.6, folate 13.5 06/25/22: Hb 10.9, MCV 82, Iron Sat 8%, TIBC 487, Ferritin 33 10/17/2022: Hemoglobin 7.4, MCV 68.5, RDW 16, ferritin 6, iron saturation 3%, TIBC 633  Patient has become severely anemic and severely iron deficient. We will plan to administer her IV iron therapy.  Recheck labs in 3 months and telephone visit after that to discuss  results.    I discussed the assessment and treatment plan with the patient. The patient was provided an opportunity to ask questions and all were answered. The patient agreed with the plan and demonstrated an understanding of the instructions. The patient was advised to call back or seek an in-person evaluation if the symptoms worsen or if the condition fails to improve as anticipated.   I provided 12 minutes of non-face-to-face time during this encounter.  This includes time for charting and coordination of care   Tamsen Meek, MD  I Janan Ridge am acting as a scribe for Dr.Edon Hoadley  I have reviewed the above documentation for accuracy and completeness, and I agree with the above.

## 2022-10-19 ENCOUNTER — Telehealth: Payer: Self-pay | Admitting: Hematology and Oncology

## 2022-10-19 ENCOUNTER — Inpatient Hospital Stay (HOSPITAL_BASED_OUTPATIENT_CLINIC_OR_DEPARTMENT_OTHER): Payer: Federal, State, Local not specified - PPO | Admitting: Hematology and Oncology

## 2022-10-19 DIAGNOSIS — D509 Iron deficiency anemia, unspecified: Secondary | ICD-10-CM

## 2022-10-19 MED FILL — Iron Sucrose Inj 20 MG/ML (Fe Equiv): INTRAVENOUS | Qty: 15 | Status: AC

## 2022-10-19 NOTE — Assessment & Plan Note (Signed)
Iron deficiency anemia: Discovered during her hospitalization in August 2022 (she was hospitalized for severe UTIs) hemoglobin was 6.9 MCV 72 (1 unit of PRBC and IV iron x1 dose) Upper endoscopy and colonoscopy performed November 2022: No evidence of bleeding   IV Iron: Dec 2022, Mar 2023    Lab Review: 08/02/21: WBC: 3.5, Hb 11.3, Ferritin: 20, Iron Sat: 7%, TIBC: 553 11/14/21: Ferritin 103, Hb 12.3, Sat: 11% 02/19/2022: Ferritin 21, iron saturation 7%, TIBC 545, B12 739, hemoglobin 10.6, folate 13.5 06/25/22: Hb 10.9, MCV 82, Iron Sat 8%, TIBC 487, Ferritin 33 10/17/2022: Hemoglobin 7.4, MCV 68.5, RDW 16, ferritin 6, iron saturation 3%, TIBC 633  Patient has become severely anemic and severely iron deficient. We will plan to administer her IV iron therapy.  Recheck labs in 3 months and telephone visit after that to discuss results.

## 2022-10-19 NOTE — Telephone Encounter (Signed)
Scheduled appointments per 5/17 los. Patient is aware of the made appointments.

## 2022-10-20 ENCOUNTER — Inpatient Hospital Stay: Payer: Federal, State, Local not specified - PPO

## 2022-10-20 VITALS — BP 135/56 | HR 82 | Temp 97.8°F | Resp 18

## 2022-10-20 DIAGNOSIS — D509 Iron deficiency anemia, unspecified: Secondary | ICD-10-CM | POA: Diagnosis not present

## 2022-10-20 MED ORDER — SODIUM CHLORIDE 0.9 % IV SOLN: Freq: Once | INTRAVENOUS | Status: AC

## 2022-10-20 MED ORDER — SODIUM CHLORIDE 0.9 % IV SOLN
300.0000 mg | Freq: Once | INTRAVENOUS | Status: AC
  Administered 2022-10-20: 300 mg via INTRAVENOUS
  Filled 2022-10-20: qty 300

## 2022-10-20 NOTE — Patient Instructions (Signed)

## 2022-10-20 NOTE — Progress Notes (Signed)
Patient declined to stay for 30 minute post infusion observation. VSS, no signs of distress noted at discharge.

## 2022-10-26 MED FILL — Iron Sucrose Inj 20 MG/ML (Fe Equiv): INTRAVENOUS | Qty: 15 | Status: AC

## 2022-10-27 ENCOUNTER — Inpatient Hospital Stay: Payer: Federal, State, Local not specified - PPO

## 2022-10-27 VITALS — BP 134/57 | HR 83 | Temp 97.6°F | Resp 17

## 2022-10-27 DIAGNOSIS — D509 Iron deficiency anemia, unspecified: Secondary | ICD-10-CM

## 2022-10-27 MED ORDER — SODIUM CHLORIDE 0.9 % IV SOLN: Freq: Once | INTRAVENOUS | Status: AC

## 2022-10-27 MED ORDER — SODIUM CHLORIDE 0.9 % IV SOLN
300.0000 mg | Freq: Once | INTRAVENOUS | Status: AC
  Administered 2022-10-27: 300 mg via INTRAVENOUS
  Filled 2022-10-27: qty 300

## 2022-10-27 NOTE — Patient Instructions (Signed)

## 2022-10-27 NOTE — Progress Notes (Signed)
Patient declined post observation.  VSS at discharge, tolerated treatment well without incident.  Ambulated to lobby.

## 2022-11-05 ENCOUNTER — Other Ambulatory Visit: Payer: Self-pay

## 2022-11-05 ENCOUNTER — Inpatient Hospital Stay: Payer: Federal, State, Local not specified - PPO | Attending: Hematology and Oncology

## 2022-11-05 VITALS — BP 161/59 | HR 77 | Temp 98.7°F | Resp 16 | Wt 152.5 lb

## 2022-11-05 DIAGNOSIS — D509 Iron deficiency anemia, unspecified: Secondary | ICD-10-CM | POA: Insufficient documentation

## 2022-11-05 MED ORDER — SODIUM CHLORIDE 0.9 % IV SOLN: Freq: Once | INTRAVENOUS | Status: AC

## 2022-11-05 MED ORDER — SODIUM CHLORIDE 0.9 % IV SOLN
300.0000 mg | Freq: Once | INTRAVENOUS | Status: AC
  Administered 2022-11-05: 300 mg via INTRAVENOUS
  Filled 2022-11-05: qty 300

## 2022-11-05 NOTE — Progress Notes (Signed)
Pt. declines to stay for 30 minute post observation. Vital signs stable, left via ambulation, no shortness of breath noted.

## 2022-11-15 DIAGNOSIS — G4733 Obstructive sleep apnea (adult) (pediatric): Secondary | ICD-10-CM | POA: Insufficient documentation

## 2022-11-15 DIAGNOSIS — J309 Allergic rhinitis, unspecified: Secondary | ICD-10-CM | POA: Insufficient documentation

## 2023-02-05 ENCOUNTER — Inpatient Hospital Stay: Payer: Federal, State, Local not specified - PPO | Attending: Hematology and Oncology

## 2023-02-05 ENCOUNTER — Other Ambulatory Visit: Payer: Self-pay

## 2023-02-05 DIAGNOSIS — D509 Iron deficiency anemia, unspecified: Secondary | ICD-10-CM | POA: Diagnosis present

## 2023-02-05 LAB — CBC WITH DIFFERENTIAL (CANCER CENTER ONLY)
Abs Immature Granulocytes: 0.01 10*3/uL (ref 0.00–0.07)
Basophils Absolute: 0 10*3/uL (ref 0.0–0.1)
Basophils Relative: 1 %
Eosinophils Absolute: 0.1 10*3/uL (ref 0.0–0.5)
Eosinophils Relative: 4 %
HCT: 27.4 % — ABNORMAL LOW (ref 36.0–46.0)
Hemoglobin: 8.6 g/dL — ABNORMAL LOW (ref 12.0–15.0)
Immature Granulocytes: 0 %
Lymphocytes Relative: 26 %
Lymphs Abs: 0.8 10*3/uL (ref 0.7–4.0)
MCH: 22.3 pg — ABNORMAL LOW (ref 26.0–34.0)
MCHC: 31.4 g/dL (ref 30.0–36.0)
MCV: 71.2 fL — ABNORMAL LOW (ref 80.0–100.0)
Monocytes Absolute: 0.4 10*3/uL (ref 0.1–1.0)
Monocytes Relative: 12 %
Neutro Abs: 1.8 10*3/uL (ref 1.7–7.7)
Neutrophils Relative %: 57 %
Platelet Count: 304 10*3/uL (ref 150–400)
RBC: 3.85 MIL/uL — ABNORMAL LOW (ref 3.87–5.11)
RDW: 16.6 % — ABNORMAL HIGH (ref 11.5–15.5)
WBC Count: 3.1 10*3/uL — ABNORMAL LOW (ref 4.0–10.5)
nRBC: 0 % (ref 0.0–0.2)

## 2023-02-05 LAB — IRON AND IRON BINDING CAPACITY (CC-WL,HP ONLY)
Iron: 23 ug/dL — ABNORMAL LOW (ref 28–170)
Saturation Ratios: 4 % — ABNORMAL LOW (ref 10.4–31.8)
TIBC: 596 ug/dL — ABNORMAL HIGH (ref 250–450)
UIBC: 573 ug/dL — ABNORMAL HIGH (ref 148–442)

## 2023-02-05 LAB — FERRITIN: Ferritin: 9 ng/mL — ABNORMAL LOW (ref 11–307)

## 2023-02-07 ENCOUNTER — Inpatient Hospital Stay (HOSPITAL_BASED_OUTPATIENT_CLINIC_OR_DEPARTMENT_OTHER): Payer: Federal, State, Local not specified - PPO | Admitting: Hematology and Oncology

## 2023-02-07 DIAGNOSIS — D509 Iron deficiency anemia, unspecified: Secondary | ICD-10-CM | POA: Diagnosis not present

## 2023-02-07 NOTE — Assessment & Plan Note (Signed)
Discovered during her hospitalization in August 2022 (she was hospitalized for severe UTIs) hemoglobin was 6.9 MCV 72 (1 unit of PRBC and IV iron x1 dose) Upper endoscopy and colonoscopy performed November 2022: No evidence of bleeding   IV Iron: Dec 2022, Mar 2023, October 2023, May 2024   Lab Review: 08/02/21: WBC: 3.5, Hb 11.3, Ferritin: 20, Iron Sat: 7%, TIBC: 553 11/14/21: Ferritin 103, Hb 12.3, Sat: 11% 02/19/2022: Ferritin 21, iron saturation 7%, TIBC 545, B12 739, hemoglobin 10.6, folate 13.5 06/25/22: Hb 10.9, MCV 82, Iron Sat 8%, TIBC 487, Ferritin 33 10/17/2022: Hemoglobin 7.4, MCV 68.5, RDW 16, ferritin 6, iron saturation 3%, TIBC 633 02/05/2023: Hemoglobin 8.6, MCV 71.2, iron saturation 4%, TIBC 596, ferritin 9  Based on the above lab values I recommended that she needs 3 more doses of IV iron

## 2023-02-07 NOTE — Progress Notes (Signed)
HEMATOLOGY-ONCOLOGY TELEPHONE VISIT PROGRESS NOTE  I connected with our patient on 02/07/23 at 11:30 AM EDT by telephone and verified that I am speaking with the correct person using two identifiers.  I discussed the limitations, risks, security and privacy concerns of performing an evaluation and management service by telephone and the availability of in person appointments.  I also discussed with the patient that there may be a patient responsible charge related to this service. The patient expressed understanding and agreed to proceed.   History of Present Illness: C/O severe fatigue.  REVIEW OF SYSTEMS:   Constitutional: Denies fevers, chills or abnormal weight loss All other systems were reviewed with the patient and are negative.  Observations/Objective:    Assessment Plan:  Iron deficiency anemia  Discovered during her hospitalization in August 2022 (she was hospitalized for severe UTIs) hemoglobin was 6.9 MCV 72 (1 unit of PRBC and IV iron x1 dose) Upper endoscopy and colonoscopy performed November 2022: No evidence of bleeding   IV Iron: Dec 2022, Mar 2023, October 2023, May 2024   Lab Review: 08/02/21: WBC: 3.5, Hb 11.3, Ferritin: 20, Iron Sat: 7%, TIBC: 553 11/14/21: Ferritin 103, Hb 12.3, Sat: 11% 02/19/2022: Ferritin 21, iron saturation 7%, TIBC 545, B12 739, hemoglobin 10.6, folate 13.5 06/25/22: Hb 10.9, MCV 82, Iron Sat 8%, TIBC 487, Ferritin 33 10/17/2022: Hemoglobin 7.4, MCV 68.5, RDW 16, ferritin 6, iron saturation 3%, TIBC 633 02/05/2023: Hemoglobin 8.6, MCV 71.2, iron saturation 4%, TIBC 596, ferritin 9  Based on the above lab values I recommended that she needs 4 more doses of IV iron   I discussed the assessment and treatment plan with the patient. The patient was provided an opportunity to ask questions and all were answered. The patient agreed with the plan and demonstrated an understanding of the instructions. The patient was advised to call back or seek an in-person  evaluation if the symptoms worsen or if the condition fails to improve as anticipated.   I provided 12 minutes of non-face-to-face time during this encounter.  This includes time for charting and coordination of care   Tamsen Meek, MD  I Janan Ridge am acting as a scribe for Dr.Dejia Ebron  I have reviewed the above documentation for accuracy and completeness, and I agree with the above.

## 2023-02-12 ENCOUNTER — Encounter: Payer: Self-pay | Admitting: Hematology and Oncology

## 2023-02-19 ENCOUNTER — Telehealth: Payer: Self-pay

## 2023-02-19 NOTE — Telephone Encounter (Signed)
Pt called and states she has called scheduling several times to schedule iron infusions and has not received a call back. She saw Dr Pamelia Hoit 9/5 and needs 4 more infusions per his note. High priority message sent to scheduling to contact pt ASAP for iron infusions. Pt aware and knows if she does not hear from them by 9/19 she will call me back.

## 2023-02-26 ENCOUNTER — Inpatient Hospital Stay: Payer: Federal, State, Local not specified - PPO

## 2023-02-26 VITALS — BP 170/60 | HR 81 | Temp 97.6°F | Resp 18

## 2023-02-26 DIAGNOSIS — D509 Iron deficiency anemia, unspecified: Secondary | ICD-10-CM

## 2023-02-26 MED ORDER — SODIUM CHLORIDE 0.9 % IV SOLN
300.0000 mg | Freq: Once | INTRAVENOUS | Status: AC
  Administered 2023-02-26: 300 mg via INTRAVENOUS
  Filled 2023-02-26: qty 300

## 2023-02-26 MED ORDER — SODIUM CHLORIDE 0.9 % IV SOLN: Freq: Once | INTRAVENOUS | Status: AC

## 2023-02-26 NOTE — Patient Instructions (Signed)
Iron Sucrose Injection What is this medication? IRON SUCROSE (EYE ern SOO krose) treats low levels of iron (iron deficiency anemia) in people with kidney disease. Iron is a mineral that plays an important role in making red blood cells, which carry oxygen from your lungs to the rest of your body. This medicine may be used for other purposes; ask your health care provider or pharmacist if you have questions. COMMON BRAND NAME(S): Venofer What should I tell my care team before I take this medication? They need to know if you have any of these conditions: Anemia not caused by low iron levels Heart disease High levels of iron in the blood Kidney disease Liver disease An unusual or allergic reaction to iron, other medications, foods, dyes, or preservatives Pregnant or trying to get pregnant Breastfeeding How should I use this medication? This medication is for infusion into a vein. It is given in a hospital or clinic setting. Talk to your care team about the use of this medication in children. While this medication may be prescribed for children as young as 2 years for selected conditions, precautions do apply. Overdosage: If you think you have taken too much of this medicine contact a poison control center or emergency room at once. NOTE: This medicine is only for you. Do not share this medicine with others. What if I miss a dose? Keep appointments for follow-up doses. It is important not to miss your dose. Call your care team if you are unable to keep an appointment. What may interact with this medication? Do not take this medication with any of the following: Deferoxamine Dimercaprol Other iron products This medication may also interact with the following: Chloramphenicol Deferasirox This list may not describe all possible interactions. Give your health care provider a list of all the medicines, herbs, non-prescription drugs, or dietary supplements you use. Also tell them if you smoke,  drink alcohol, or use illegal drugs. Some items may interact with your medicine. What should I watch for while using this medication? Visit your care team regularly. Tell your care team if your symptoms do not start to get better or if they get worse. You may need blood work done while you are taking this medication. You may need to follow a special diet. Talk to your care team. Foods that contain iron include: whole grains/cereals, dried fruits, beans, or peas, leafy green vegetables, and organ meats (liver, kidney). What side effects may I notice from receiving this medication? Side effects that you should report to your care team as soon as possible: Allergic reactions--skin rash, itching, hives, swelling of the face, lips, tongue, or throat Low blood pressure--dizziness, feeling faint or lightheaded, blurry vision Shortness of breath Side effects that usually do not require medical attention (report to your care team if they continue or are bothersome): Flushing Headache Joint pain Muscle pain Nausea Pain, redness, or irritation at injection site This list may not describe all possible side effects. Call your doctor for medical advice about side effects. You may report side effects to FDA at 1-800-FDA-1088. Where should I keep my medication? This medication is given in a hospital or clinic and will not be stored at home. NOTE: This sheet is a summary. It may not cover all possible information. If you have questions about this medicine, talk to your doctor, pharmacist, or health care provider.  2024 Elsevier/Gold Standard (2021-11-29 00:00:00)

## 2023-03-01 ENCOUNTER — Other Ambulatory Visit: Payer: Self-pay | Admitting: *Deleted

## 2023-03-01 NOTE — Progress Notes (Signed)
Received call from pt refusing to go to [facility] location for IV iron.  RN requested orders be replaced for [facility].

## 2023-03-01 NOTE — Progress Notes (Signed)
Received message from infusion stating pt will need to be scheduled at [facility] location for IV iron.  MD notified and verbal orders received and placed.

## 2023-03-06 ENCOUNTER — Inpatient Hospital Stay: Payer: Federal, State, Local not specified - PPO | Attending: Hematology and Oncology

## 2023-03-06 ENCOUNTER — Other Ambulatory Visit: Payer: Self-pay | Admitting: Hematology and Oncology

## 2023-03-06 VITALS — BP 153/62 | HR 78 | Temp 97.7°F | Resp 18

## 2023-03-06 DIAGNOSIS — D509 Iron deficiency anemia, unspecified: Secondary | ICD-10-CM | POA: Diagnosis present

## 2023-03-06 MED ORDER — SODIUM CHLORIDE 0.9 % IV SOLN: Freq: Once | INTRAVENOUS | Status: AC

## 2023-03-06 MED ORDER — SODIUM CHLORIDE 0.9 % IV SOLN
300.0000 mg | Freq: Once | INTRAVENOUS | Status: AC
  Administered 2023-03-06: 300 mg via INTRAVENOUS
  Filled 2023-03-06: qty 300

## 2023-03-06 NOTE — Progress Notes (Signed)
Pt declined observation period. VSS. No complaints at time of discharge.

## 2023-03-06 NOTE — Patient Instructions (Signed)

## 2023-03-13 ENCOUNTER — Inpatient Hospital Stay: Payer: Federal, State, Local not specified - PPO

## 2023-03-13 VITALS — BP 158/62 | HR 70 | Temp 98.4°F | Resp 16

## 2023-03-13 DIAGNOSIS — D509 Iron deficiency anemia, unspecified: Secondary | ICD-10-CM

## 2023-03-13 MED ORDER — SODIUM CHLORIDE 0.9 % IV SOLN: Freq: Once | INTRAVENOUS | Status: AC

## 2023-03-13 MED ORDER — SODIUM CHLORIDE 0.9 % IV SOLN
300.0000 mg | Freq: Once | INTRAVENOUS | Status: AC
  Administered 2023-03-13: 300 mg via INTRAVENOUS
  Filled 2023-03-13: qty 300

## 2023-03-13 NOTE — Patient Instructions (Signed)

## 2023-03-13 NOTE — Progress Notes (Signed)
Pt. declined to stay for 30 minute post iron observation. Vital signs stable. Ambulated out of clinic unassisted without incident  Vitals:   03/13/23 1431 03/13/23 1626  BP: (!) 133/51 (!) 158/62  Pulse: 81 70  Resp: 18 16  Temp: 98.5 F (36.9 C) 98.4 F (36.9 C)  SpO2: 96% 96%

## 2023-03-20 ENCOUNTER — Inpatient Hospital Stay: Payer: Federal, State, Local not specified - PPO

## 2023-03-20 ENCOUNTER — Other Ambulatory Visit: Payer: Self-pay | Admitting: Adult Health

## 2023-03-20 VITALS — BP 138/78 | HR 72 | Temp 98.2°F | Resp 18

## 2023-03-20 DIAGNOSIS — D509 Iron deficiency anemia, unspecified: Secondary | ICD-10-CM | POA: Diagnosis not present

## 2023-03-20 MED ORDER — SODIUM CHLORIDE 0.9 % IV SOLN
300.0000 mg | Freq: Once | INTRAVENOUS | Status: AC
  Administered 2023-03-20: 300 mg via INTRAVENOUS
  Filled 2023-03-20: qty 300

## 2023-03-20 NOTE — Patient Instructions (Signed)
Iron Sucrose Injection What is this medication? IRON SUCROSE (EYE ern SOO krose) treats low levels of iron (iron deficiency anemia) in people with kidney disease. Iron is a mineral that plays an important role in making red blood cells, which carry oxygen from your lungs to the rest of your body. This medicine may be used for other purposes; ask your health care provider or pharmacist if you have questions. COMMON BRAND NAME(S): Venofer What should I tell my care team before I take this medication? They need to know if you have any of these conditions: Anemia not caused by low iron levels Heart disease High levels of iron in the blood Kidney disease Liver disease An unusual or allergic reaction to iron, other medications, foods, dyes, or preservatives Pregnant or trying to get pregnant Breastfeeding How should I use this medication? This medication is for infusion into a vein. It is given in a hospital or clinic setting. Talk to your care team about the use of this medication in children. While this medication may be prescribed for children as young as 2 years for selected conditions, precautions do apply. Overdosage: If you think you have taken too much of this medicine contact a poison control center or emergency room at once. NOTE: This medicine is only for you. Do not share this medicine with others. What if I miss a dose? Keep appointments for follow-up doses. It is important not to miss your dose. Call your care team if you are unable to keep an appointment. What may interact with this medication? Do not take this medication with any of the following: Deferoxamine Dimercaprol Other iron products This medication may also interact with the following: Chloramphenicol Deferasirox This list may not describe all possible interactions. Give your health care provider a list of all the medicines, herbs, non-prescription drugs, or dietary supplements you use. Also tell them if you smoke,  drink alcohol, or use illegal drugs. Some items may interact with your medicine. What should I watch for while using this medication? Visit your care team regularly. Tell your care team if your symptoms do not start to get better or if they get worse. You may need blood work done while you are taking this medication. You may need to follow a special diet. Talk to your care team. Foods that contain iron include: whole grains/cereals, dried fruits, beans, or peas, leafy green vegetables, and organ meats (liver, kidney). What side effects may I notice from receiving this medication? Side effects that you should report to your care team as soon as possible: Allergic reactions--skin rash, itching, hives, swelling of the face, lips, tongue, or throat Low blood pressure--dizziness, feeling faint or lightheaded, blurry vision Shortness of breath Side effects that usually do not require medical attention (report to your care team if they continue or are bothersome): Flushing Headache Joint pain Muscle pain Nausea Pain, redness, or irritation at injection site This list may not describe all possible side effects. Call your doctor for medical advice about side effects. You may report side effects to FDA at 1-800-FDA-1088. Where should I keep my medication? This medication is given in a hospital or clinic and will not be stored at home. NOTE: This sheet is a summary. It may not cover all possible information. If you have questions about this medicine, talk to your doctor, pharmacist, or health care provider.  2024 Elsevier/Gold Standard (2021-11-29 00:00:00)

## 2023-03-27 ENCOUNTER — Inpatient Hospital Stay: Payer: Federal, State, Local not specified - PPO

## 2023-03-27 VITALS — BP 188/68

## 2023-03-27 DIAGNOSIS — D509 Iron deficiency anemia, unspecified: Secondary | ICD-10-CM | POA: Diagnosis not present

## 2023-03-27 MED ORDER — SODIUM CHLORIDE 0.9 % IV SOLN
Freq: Once | INTRAVENOUS | Status: AC
Start: 2023-03-27 — End: 2023-03-27

## 2023-03-27 MED ORDER — FAMOTIDINE IN NACL 20-0.9 MG/50ML-% IV SOLN: 20.0000 mg | Freq: Once | INTRAVENOUS | Status: DC | PRN

## 2023-03-27 MED ORDER — EPINEPHRINE 0.3 MG/0.3ML IJ SOAJ
0.3000 mg | Freq: Once | INTRAMUSCULAR | Status: DC | PRN
Start: 2023-03-27 — End: 2023-03-27

## 2023-03-27 MED ORDER — ALBUTEROL SULFATE HFA 108 (90 BASE) MCG/ACT IN AERS: 2.0000 | INHALATION_SPRAY | Freq: Once | RESPIRATORY_TRACT | Status: DC | PRN

## 2023-03-27 MED ORDER — METHYLPREDNISOLONE SODIUM SUCC 125 MG IJ SOLR: 125.0000 mg | Freq: Once | INTRAMUSCULAR | Status: DC | PRN

## 2023-03-27 MED ORDER — ALTEPLASE 2 MG IJ SOLR
2.0000 mg | Freq: Once | INTRAMUSCULAR | Status: DC | PRN
Start: 2023-03-27 — End: 2023-03-27

## 2023-03-27 MED ORDER — SODIUM CHLORIDE 0.9% FLUSH
3.0000 mL | Freq: Once | INTRAVENOUS | Status: DC | PRN
Start: 2023-03-27 — End: 2023-03-27

## 2023-03-27 MED ORDER — HEPARIN SOD (PORK) LOCK FLUSH 100 UNIT/ML IV SOLN
500.0000 [IU] | Freq: Once | INTRAVENOUS | Status: DC | PRN
Start: 2023-03-27 — End: 2023-03-27

## 2023-03-27 MED ORDER — SODIUM CHLORIDE 0.9 % IV SOLN: Freq: Once | INTRAVENOUS | Status: DC | PRN

## 2023-03-27 MED ORDER — SODIUM CHLORIDE 0.9% FLUSH
10.0000 mL | Freq: Once | INTRAVENOUS | Status: DC | PRN
Start: 2023-03-27 — End: 2023-03-27

## 2023-03-27 MED ORDER — HEPARIN SOD (PORK) LOCK FLUSH 100 UNIT/ML IV SOLN
250.0000 [IU] | Freq: Once | INTRAVENOUS | Status: DC | PRN
Start: 2023-03-27 — End: 2023-03-27

## 2023-03-27 MED ORDER — DIPHENHYDRAMINE HCL 50 MG/ML IJ SOLN: 50.0000 mg | Freq: Once | INTRAMUSCULAR | Status: DC | PRN

## 2023-03-27 MED ORDER — SODIUM CHLORIDE 0.9 % IV SOLN
300.0000 mg | Freq: Once | INTRAVENOUS | Status: AC
  Administered 2023-03-27: 300 mg via INTRAVENOUS
  Filled 2023-03-27: qty 300

## 2023-03-27 NOTE — Patient Instructions (Signed)
Iron Sucrose Injection What is this medication? IRON SUCROSE (EYE ern SOO krose) treats low levels of iron (iron deficiency anemia) in people with kidney disease. Iron is a mineral that plays an important role in making red blood cells, which carry oxygen from your lungs to the rest of your body. This medicine may be used for other purposes; ask your health care provider or pharmacist if you have questions. COMMON BRAND NAME(S): Venofer What should I tell my care team before I take this medication? They need to know if you have any of these conditions: Anemia not caused by low iron levels Heart disease High levels of iron in the blood Kidney disease Liver disease An unusual or allergic reaction to iron, other medications, foods, dyes, or preservatives Pregnant or trying to get pregnant Breastfeeding How should I use this medication? This medication is for infusion into a vein. It is given in a hospital or clinic setting. Talk to your care team about the use of this medication in children. While this medication may be prescribed for children as young as 2 years for selected conditions, precautions do apply. Overdosage: If you think you have taken too much of this medicine contact a poison control center or emergency room at once. NOTE: This medicine is only for you. Do not share this medicine with others. What if I miss a dose? Keep appointments for follow-up doses. It is important not to miss your dose. Call your care team if you are unable to keep an appointment. What may interact with this medication? Do not take this medication with any of the following: Deferoxamine Dimercaprol Other iron products This medication may also interact with the following: Chloramphenicol Deferasirox This list may not describe all possible interactions. Give your health care provider a list of all the medicines, herbs, non-prescription drugs, or dietary supplements you use. Also tell them if you smoke,  drink alcohol, or use illegal drugs. Some items may interact with your medicine. What should I watch for while using this medication? Visit your care team regularly. Tell your care team if your symptoms do not start to get better or if they get worse. You may need blood work done while you are taking this medication. You may need to follow a special diet. Talk to your care team. Foods that contain iron include: whole grains/cereals, dried fruits, beans, or peas, leafy green vegetables, and organ meats (liver, kidney). What side effects may I notice from receiving this medication? Side effects that you should report to your care team as soon as possible: Allergic reactions--skin rash, itching, hives, swelling of the face, lips, tongue, or throat Low blood pressure--dizziness, feeling faint or lightheaded, blurry vision Shortness of breath Side effects that usually do not require medical attention (report to your care team if they continue or are bothersome): Flushing Headache Joint pain Muscle pain Nausea Pain, redness, or irritation at injection site This list may not describe all possible side effects. Call your doctor for medical advice about side effects. You may report side effects to FDA at 1-800-FDA-1088. Where should I keep my medication? This medication is given in a hospital or clinic. It will not be stored at home. NOTE: This sheet is a summary. It may not cover all possible information. If you have questions about this medicine, talk to your doctor, pharmacist, or health care provider.  2024 Elsevier/Gold Standard (2022-10-26 00:00:00)

## 2023-03-28 NOTE — Progress Notes (Signed)
Chief Complaint  Patient presents with   Follow-up    CAD   History of Present Illness: 72 yo female with history of HTN, HLD, CAD and diabetes who is here today for cardiac follow up. I saw her 2017 to re-establish care in our office. She was known to have minor CAD by cath in 2004 in [place]. Her stress test in 2014 showed no ischemia. Echo in 2014 with normal LV function, mild AI. She was admitted to [facility] in September 2017 with neck pain radiating to her chest. Neck MRI with cervical spine disease. Nuclear stress test 02/07/16 with no ischemia. Echo 02/07/16 with normal LV size and function, mild AI. Cardiac monitor June 2019 with several short runs of atrial tachycardia. Echo November 2023 with LVEF=65-70%, trivial MR. Mild AI. Mild AS mean gradient 13 mmHg. She does not tolerate Calcium channel blockers, Toprol, clonidine, Norvasc.   She is here today for follow up. The patient denies any exertional chest pain, dyspnea, palpitations, lower extremity edema, orthopnea, PND, dizziness, near syncope or syncope. She did have two weeks of continuous aching pain in her chest a few weeks back. No associated dyspnea and no change with exertion.   Primary Care Physician: Clemetine Marker  Past Medical History:  Diagnosis Date   Anxiety    Arthritis    Osteoarthritis- knee and back   Coronary artery disease    Cath 2004 in [place], [place] with minor CAD   Depression    Diabetes mellitus    On oral and diet   GERD (gastroesophageal reflux disease)    Heart murmur    Hyperlipidemia    Hypertension    Sleep apnea    does not use CPAP- it is broken.  Last sleep study was 7 years ago.  Has not worn in  4  years    Past Surgical History:  Procedure Laterality Date   ABDOMINAL HYSTERECTOMY     COLONOSCOPY     Dr.Mann   KNEE ARTHROCENTESIS     TOTAL KNEE ARTHROPLASTY  06/22/2011   Bilateral     Current Outpatient Medications  Medication Sig Dispense Refill   acetaminophen  (TYLENOL) 325 MG tablet Take 325-650 mg by mouth every 6 (six) hours as needed for mild pain or headache.     ALPRAZolam (XANAX) 0.5 MG tablet Take 0.5 mg by mouth in the morning and at bedtime.     amoxicillin (AMOXIL) 500 MG capsule amoxicillin 500 mg capsule  TAKE 4 CAPSULES BY MOUTH 1 HOUR PRIOR TO DENTAL APPOINTMENT THEN 2 CAPSULES BY MOUTH AFTER APPOINTMENT     aspirin EC 81 MG tablet Take 81 mg by mouth at bedtime.       Blood Glucose Monitoring Suppl (ONETOUCH VERIO) w/Device KIT use to test your blood sugar     Calcium Citrate-Vitamin D (CALCIUM CITRATE + D3 PO) Take 1 tablet by mouth every Monday, Wednesday, and Friday.     cetirizine (ZYRTEC) 5 MG tablet Take 5 mg by mouth daily.     docusate sodium (COLACE) 100 MG capsule Take 100 mg by mouth daily.     ferrous gluconate (FERGON) 324 MG tablet Take 1 tablet (324 mg total) by mouth daily with breakfast. 30 tablet 3   gemfibrozil (LOPID) 600 MG tablet Take 600 mg by mouth 2 (two) times daily before a meal.      glucose blood (ONETOUCH VERIO) test strip use to test your blood sugar  hydrALAZINE (APRESOLINE) 100 MG tablet TAKE ONE TABLET BY MOUTH THREE TIMES A DAY 270 tablet 1   hydrOXYzine (ATARAX/VISTARIL) 25 MG tablet Take 25 mg by mouth at bedtime.       ketoconazole (NIZORAL) 2 % shampoo Apply 1 Application topically 2 (two) times a week.     lansoprazole (PREVACID) 30 MG capsule TAKE 1 CAPSULE BY MOUTH TWICE A DAY BEFORE MEALS 60 capsule 4   metFORMIN (GLUCOPHAGE) 500 MG tablet Take 1,000 mg by mouth daily with breakfast.     ondansetron (ZOFRAN) 4 MG tablet Take 1 tablet (4 mg total) by mouth as directed. Take one Zofran pill 30-60 minutes before each colonoscopy prep dose 2 tablet 0   tamsulosin (FLOMAX) 0.4 MG CAPS capsule Take 1 capsule (0.4 mg total) by mouth daily after supper. 30 capsule 2   venlafaxine XR (EFFEXOR-XR) 150 MG 24 hr capsule Take 150 mg by mouth at bedtime.     Current Facility-Administered Medications   Medication Dose Route Frequency Provider Last Rate Last Admin   0.9 %  sodium chloride infusion  500 mL Intravenous Once Mansouraty, Netty Starring., MD        Allergies  Allergen Reactions   Latex Rash   Codeine Nausea And Vomiting   Covid-19 Mrna Vacc (Moderna) Hives    whole body hives, trouble breathing, imbalance. falls within 24 h   Crestor [Rosuvastatin Calcium] Nausea And Vomiting   Fish Oil Nausea And Vomiting   Hydralazine Other (See Comments) and Cough    100 mg three times a day causes excessive coughing   Lipitor [Atorvastatin Calcium] Nausea And Vomiting   Peanut-Containing Drug Products Hives   Statins Nausea And Vomiting   Welchol [Colesevelam Hcl] Nausea And Vomiting   Zithromax [Azithromycin] Nausea And Vomiting   Other Other (See Comments)    Pet dander    Social History   Socioeconomic History   Marital status: Married    Spouse name: Not on file   Number of children: 2   Years of education: Not on file   Highest education level: Not on file  Occupational History   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  Tobacco Use   Smoking status: Never   Smokeless tobacco: Never  Vaping Use   Vaping status: Never Used  Substance and Sexual Activity   Alcohol use: No   Drug use: Not Currently   Sexual activity: Yes    Birth control/protection: Surgical  Other Topics Concern   Not on file  Social History Narrative   Not on file   Social Determinants of Health   Financial Resource Strain: Not on file  Food Insecurity: Low Risk  (11/08/2022)   Received from [facility]   Hunger Vital Sign    Worried About Running Out of Food in the Last Year: Never true    Ran Out of Food in the Last Year: Never true  Transportation Needs: Not on file (11/08/2022)  Physical Activity: Not on file  Stress: Not on file  Social Connections: Not on file  Intimate Partner Violence: Not on file    Family History  Problem Relation Age of Onset   Heart attack Mother 76   Liver  disease Father    Heart attack Son        Age 4, he is our patient   Anesthesia problems Neg Hx    Colon cancer Neg Hx    Esophageal cancer Neg Hx    Stomach cancer Neg Hx  Review of Systems:  As stated in the HPI and otherwise negative.   BP 134/70   Pulse 82   Ht 5' (1.524 m)   Wt 65 kg   SpO2 98%   BMI 28.01 kg/m   Physical Examination: General: Well developed, well nourished, NAD  HEENT: OP clear, mucus membranes moist  SKIN: warm, dry. No rashes. Neuro: No focal deficits  Musculoskeletal: Muscle strength 5/5 all ext  Psychiatric: Mood and affect normal  Neck: No JVD, no carotid bruits, no thyromegaly, no lymphadenopathy.  Lungs:Clear bilaterally, no wheezes, rhonci, crackles Cardiovascular: Regular rate and rhythm. No murmurs, gallops or rubs. Abdomen:Soft. Bowel sounds present. Non-tender.  Extremities: No lower extremity edema. Pulses are 2 + in the bilateral DP/PT.  EKG:  EKG is ordered today. The ekg ordered today demonstrates  EKG Interpretation Date/Time:  Friday March 29 2023 14:06:48 EDT Ventricular Rate:  82 PR Interval:  136 QRS Duration:  134 QT Interval:  430 QTC Calculation: 502 R Axis:   40  Text Interpretation: Normal sinus rhythm with sinus arrhythmia Right bundle branch block Confirmed by Verne Carrow [phone]) on 03/29/2023 2:12:45 PM    Recent Labs: 02/05/2023: Hemoglobin 8.6; Platelet Count 304   Lipid Panel No results found for: "CHOL", "TRIG", "HDL", "CHOLHDL", "VLDL", "LDLCALC", "LDLDIRECT"   Wt Readings from Last 3 Encounters:  03/29/23 65 kg  11/05/22 69.2 kg  03/26/22 72.1 kg    Assessment and Plan:   1. CAD without angina:  She is known to have mild CAD by cath in 2004. Nuclear stress test September 2017 with no ischemia. Echo in 2023 with normal LV function. No exertional chest pain. Continue ASA. She does not tolerate statins.  I will check her lipids today and will consider referral to the lipid clinic to discuss  Repatha.   2. Aortic insufficiency/mitral regurgitation: Mild AI and mild AS by echo in November 2023. Repeat echo in November 2025.    3. HTN: BP is well controlled. No changes  4. History of Atrial tachycardia: No palpitations.    5. RBBB: new finding today  Labs/ tests ordered today include:   Orders Placed This Encounter  Procedures   Lipid Profile   EKG 12-Lead   Disposition:   F/U with me in 12 months  Signed, Verne Carrow, MD 03/29/2023 2:28 PM    [facility] [address] [place], [place], [place]  [address] Phone: [phone]; Fax: [phone]

## 2023-03-29 ENCOUNTER — Ambulatory Visit: Payer: Federal, State, Local not specified - PPO | Attending: Cardiovascular Disease | Admitting: Cardiovascular Disease

## 2023-03-29 ENCOUNTER — Encounter: Payer: Self-pay | Admitting: Cardiovascular Disease

## 2023-03-29 VITALS — BP 134/70 | HR 82 | Ht 60.0 in | Wt 143.4 lb

## 2023-03-29 DIAGNOSIS — I10 Essential (primary) hypertension: Secondary | ICD-10-CM

## 2023-03-29 DIAGNOSIS — I2510 Atherosclerotic heart disease of native coronary artery without angina pectoris: Secondary | ICD-10-CM

## 2023-03-29 DIAGNOSIS — I350 Nonrheumatic aortic (valve) stenosis: Secondary | ICD-10-CM

## 2023-03-29 NOTE — Patient Instructions (Signed)
Medication Instructions:  No changes *If you need a refill on your cardiac medications before your next appointment, please call your pharmacy*   Lab Work: Today: lipids  If you have labs (blood work) drawn today and your tests are completely normal, you will receive your results only by: [place] (if you have MyChart) OR A paper copy in the mail If you have any lab test that is abnormal or we need to change your treatment, we will call you to review the results.   Testing/Procedures: none   Follow-Up: At [facility], you and your health needs are our priority.  As part of our continuing mission to provide you with exceptional heart care, we have created designated Provider Care Teams.  These Care Teams include your primary Cardiologist (physician) and Advanced Practice Providers (APPs -  Physician Assistants and Nurse Practitioners) who all work together to provide you with the care you need, when you need it.   Your next appointment:   12 month(s)  Provider:   Lauree Chandler, MD

## 2023-03-30 LAB — LIPID PANEL
Chol/HDL Ratio: 3.1 ratio (ref 0.0–4.4)
Cholesterol, Total: 173 mg/dL (ref 100–199)
HDL: 55 mg/dL (ref >39)
LDL Chol Calc (NIH): 96 mg/dL (ref 0–99)
Triglycerides: 122 mg/dL (ref 0–149)
VLDL Cholesterol Cal: 22 mg/dL (ref 5–40)

## 2023-04-09 ENCOUNTER — Other Ambulatory Visit: Payer: Self-pay | Admitting: *Deleted

## 2023-04-09 DIAGNOSIS — I2510 Atherosclerotic heart disease of native coronary artery without angina pectoris: Secondary | ICD-10-CM

## 2023-04-09 DIAGNOSIS — E782 Mixed hyperlipidemia: Secondary | ICD-10-CM

## 2023-04-09 NOTE — Progress Notes (Signed)
Per result notes, pt needs pharmD visit.

## 2023-04-12 ENCOUNTER — Encounter: Payer: Self-pay | Admitting: Cardiovascular Disease

## 2023-04-12 NOTE — Telephone Encounter (Signed)
Error

## 2023-04-21 NOTE — Progress Notes (Unsigned)
Patient ID: Shirley Juarez                 DOB: 13-Apr-1951                    MRN: [id number]     HPI: Shirley Juarez is a 72 y.o. female patient referred to lipid clinic by Dr. Sanjuana Kava. PMH is significant for HTN, CAD (mild disease on LHC 2004, negative stress test 2014, ECHO 2023 normal LV function), DM type 2. At her most recent visit with the cardiology clinic on 03/29/23 with Dr. Sanjuana Kava, the patient was seen for follow-up and with complaints of continuous chest pain for the previous few weeks. Patient was instructed to continue aspirin and an updated lipid panel was ordered that revealed LDL of 96 mg/dL. Patient has previously been on statin therapy but is not currently on a statin due to history of intolerance and she was referred to the pharmacy clinic for evaluation.  At this visit the results of the most recent lipid panel were reviewed and additional risk factor modification was discussed. Patient was aware that her LDL was above goal of 70 mg/dL. When asking the patient about previous medications, the patient confirmed that she had experienced nausea and vomiting with atorvastatin, rosuvastatin, and ezetimibe. Patient was not agreeable to trial lower doses of statins or alternative statins. Patient was unsure why she was started on gemfibrozil and she assumed it was for LDL reduction as she did not tolerate statins. Patient stated that she wasn't taking any statin in combination with gemfibrozil which could have exacerbated the associated nausea and vomiting.  Current Medications: gemfibrozil 600 mg twice daily  Intolerances: rosuvastatin, atorvastatin, colesevelam, ezetimibe: nausea and vomiting Risk Factors: DM type 2 for 10+ years, ASCVD risk score >7.5% (~30%), LDL-C 96 mg/dL LDL goal: <10 mg/dL  Diet: Breakfast: scrambled egg, 2 slices of bacon OR sausage patty + egg OR rice + butter Lunch: (only eats if she misses breakfast and will eat the same meals) Dinner: tacos + salad, pizza +  salad, grilled chicken, baked potato (butter, no sour cream), grilled pork chop Snacks: apple, banana, protein bar Drinks: 1 cup coffee (decaf) + sugar + creamer   Exercise:  Walk--every day; around the block, in stores, at the community center  Family History  Problem Relation Age of Onset   Heart attack Mother 33   Liver disease Father    Heart attack Son        Age 64, he is our patient   Anesthesia problems Neg Hx    Colon cancer Neg Hx    Esophageal cancer Neg Hx    Stomach cancer Neg Hx    Social History   Socioeconomic History   Marital status: Married    Spouse name: Not on file   Number of children: 2   Years of education: Not on file   Highest education level: Not on file  Occupational History   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  Tobacco Use   Smoking status: Never   Smokeless tobacco: Never  Vaping Use   Vaping status: Never Used  Substance and Sexual Activity   Alcohol use: No   Drug use: Not Currently   Sexual activity: Yes    Birth control/protection: Surgical  Other Topics Concern   Not on file  Social History Narrative   Not on file   Social Determinants of Health   Financial Resource Strain: Not on file  Food Insecurity: Low  Risk  (11/08/2022)   Received from [facility]   Hunger Vital Sign    Worried About Running Out of Food in the Last Year: Never true    Ran Out of Food in the Last Year: Never true  Transportation Needs: Not on file (11/08/2022)  Physical Activity: Not on file  Stress: Not on file  Social Connections: Not on file   Labs: Lipid Panel     Component Value Date/Time   CHOL 173 03/29/2023 1436   TRIG 122 03/29/2023 1436   HDL 55 03/29/2023 1436   CHOLHDL 3.1 03/29/2023 1436   LDLCALC 96 03/29/2023 1436   LABVLDL 22 03/29/2023 1436     Past Medical History:  Diagnosis Date   Anxiety    Arthritis    Osteoarthritis- knee and back   Coronary artery disease    Cath 2004 in [place], [place] with minor CAD   Depression     Diabetes mellitus    On oral and diet   GERD (gastroesophageal reflux disease)    Heart murmur    Hyperlipidemia    Hypertension    Sleep apnea    does not use CPAP- it is broken.  Last sleep study was 7 years ago.  Has not worn in  4  years    Current Outpatient Medications on File Prior to Visit  Medication Sig Dispense Refill   acetaminophen (TYLENOL) 325 MG tablet Take 325-650 mg by mouth every 6 (six) hours as needed for mild pain or headache.     ALPRAZolam (XANAX) 0.5 MG tablet Take 0.5 mg by mouth in the morning and at bedtime.     amoxicillin (AMOXIL) 500 MG capsule amoxicillin 500 mg capsule  TAKE 4 CAPSULES BY MOUTH 1 HOUR PRIOR TO DENTAL APPOINTMENT THEN 2 CAPSULES BY MOUTH AFTER APPOINTMENT     aspirin EC 81 MG tablet Take 81 mg by mouth at bedtime.       Blood Glucose Monitoring Suppl (ONETOUCH VERIO) w/Device KIT use to test your blood sugar     Calcium Citrate-Vitamin D (CALCIUM CITRATE + D3 PO) Take 1 tablet by mouth every Monday, Wednesday, and Friday.     cetirizine (ZYRTEC) 5 MG tablet Take 5 mg by mouth daily.     docusate sodium (COLACE) 100 MG capsule Take 100 mg by mouth daily.     ferrous gluconate (FERGON) 324 MG tablet Take 1 tablet (324 mg total) by mouth daily with breakfast. 30 tablet 3   gemfibrozil (LOPID) 600 MG tablet Take 600 mg by mouth 2 (two) times daily before a meal.      glucose blood (ONETOUCH VERIO) test strip use to test your blood sugar     hydrALAZINE (APRESOLINE) 100 MG tablet TAKE ONE TABLET BY MOUTH THREE TIMES A DAY 270 tablet 1   hydrOXYzine (ATARAX/VISTARIL) 25 MG tablet Take 25 mg by mouth at bedtime.       ketoconazole (NIZORAL) 2 % shampoo Apply 1 Application topically 2 (two) times a week.     lansoprazole (PREVACID) 30 MG capsule TAKE 1 CAPSULE BY MOUTH TWICE A DAY BEFORE MEALS 60 capsule 4   metFORMIN (GLUCOPHAGE) 500 MG tablet Take 1,000 mg by mouth daily with breakfast.     ondansetron (ZOFRAN) 4 MG tablet Take 1 tablet (4 mg  total) by mouth as directed. Take one Zofran pill 30-60 minutes before each colonoscopy prep dose 2 tablet 0   tamsulosin (FLOMAX) 0.4 MG CAPS capsule Take 1 capsule (0.4 mg  total) by mouth daily after supper. 30 capsule 2   venlafaxine XR (EFFEXOR-XR) 150 MG 24 hr capsule Take 150 mg by mouth at bedtime.     Current Facility-Administered Medications on File Prior to Visit  Medication Dose Route Frequency Provider Last Rate Last Admin   0.9 %  sodium chloride infusion  500 mL Intravenous Once Mansouraty, Netty Starring., MD        Allergies  Allergen Reactions   Latex Rash   Codeine Nausea And Vomiting   Covid-19 Mrna Vacc (Moderna) Hives    whole body hives, trouble breathing, imbalance. falls within 24 h   Crestor [Rosuvastatin Calcium] Nausea And Vomiting   Fish Oil Nausea And Vomiting   Hydralazine Other (See Comments) and Cough    100 mg three times a day causes excessive coughing   Lipitor [Atorvastatin Calcium] Nausea And Vomiting   Peanut-Containing Drug Products Hives   Statins Nausea And Vomiting   Welchol [Colesevelam Hcl] Nausea And Vomiting   Zithromax [Azithromycin] Nausea And Vomiting   Other Other (See Comments)    Pet dander    Assessment/Plan:  1. Hyperlipidemia -  Most recent LDL of 96 mg/dL is above goal of 70 mg/dL with documented intolerances to atorvastatin, rosuvastatin, and ezetimibe; patient was not agreeable to trial any other statins at this time Addition of PCSK9 inhibitor was discussed including efficacy, dosing, side effects, and copay information were reviewed. Patient was agreeable to start. Discontinue gemfibrozil as this therapy does not provide CV risk reduction and minimual LDL reduction. Her triglycerides at this time are not significantly elevate. Will submit prescription for Repatha (evolocumab) to insurance for review and submit PA if necessary Will follow-up with patient based on results on insurance query   Wilmer Floor, PharmD PGY2  Cardiology Pharmacy Resident

## 2023-04-22 ENCOUNTER — Ambulatory Visit: Payer: Federal, State, Local not specified - PPO | Attending: Internal Medicine | Admitting: Pharmacist

## 2023-04-22 DIAGNOSIS — E782 Mixed hyperlipidemia: Secondary | ICD-10-CM

## 2023-04-22 DIAGNOSIS — I25118 Atherosclerotic heart disease of native coronary artery with other forms of angina pectoris: Secondary | ICD-10-CM | POA: Diagnosis not present

## 2023-04-22 NOTE — Patient Instructions (Addendum)
Your LDL is elevated at 96 mg/dL above your goal of 70 mg/dL. We are going to start you on a new medication called Repatha or evolocumab. It is an injection you will self administer every 2 weeks. We reviewed injection technique today, but if you have any further questions please call the clinic. We are going to do a price check and submit a prior authorizations for this medication to your insurance. We will follow-up with you once we know the cost of this medication and send it to your preferred pharmacy. You can stop taking you gemfibrozil as this is not providing you any benefit for LDL reduction or heart health.  Thanks for coming in and we'll follow-up with you soon!  Wilmer Floor, PharmD PGY2 Cardiology Pharmacy Resident

## 2023-04-24 ENCOUNTER — Other Ambulatory Visit (HOSPITAL_COMMUNITY): Payer: Self-pay

## 2023-04-24 ENCOUNTER — Telehealth: Payer: Self-pay | Admitting: Pharmacy Technician

## 2023-04-24 ENCOUNTER — Encounter: Payer: Self-pay | Admitting: Hematology and Oncology

## 2023-04-24 DIAGNOSIS — E782 Mixed hyperlipidemia: Secondary | ICD-10-CM

## 2023-04-24 NOTE — Telephone Encounter (Signed)
Pharmacy Patient Advocate Encounter   Received notification from  [facility] charts  that prior authorization for repatha is required/requested.   Insurance verification completed.   The patient is insured through  [employer]  .   Per test claim: PA required; PA submitted to above mentioned insurance via CoverMyMeds Key/confirmation #/EOC [id number] Status is pending

## 2023-04-24 NOTE — Telephone Encounter (Signed)
-----   Message from Olene Floss sent at 04/22/2023  4:04 PM EST ----- Please do PA for Repatha I25.10 angina Intolerant to  rosuvastatin, atorvastatin, colesevelam, ezetimibe

## 2023-04-26 NOTE — Telephone Encounter (Signed)
Pharmacy Patient Advocate Encounter  Received notification from [employer] that Prior Authorization for repatha has been APPROVED from 03/26/23 to 04/24/24   PA #/Case ID/Reference #: [id number]

## 2023-04-29 MED ORDER — REPATHA SURECLICK 140 MG/ML ~~LOC~~ SOAJ: 1.0000 mL | SUBCUTANEOUS | 11 refills | Status: DC

## 2023-04-29 NOTE — Telephone Encounter (Signed)
Patient made aware of approval. She was given # to call [phone] for copay card. She has [employer]. She will come for fasting labs in 2-3 months

## 2023-04-29 NOTE — Addendum Note (Signed)
Addended by: Malena Peer D on: 04/29/2023 12:02 PM   Modules accepted: Orders

## 2023-05-08 ENCOUNTER — Inpatient Hospital Stay: Payer: Federal, State, Local not specified - PPO | Attending: Hematology and Oncology

## 2023-05-08 DIAGNOSIS — D509 Iron deficiency anemia, unspecified: Secondary | ICD-10-CM

## 2023-05-08 LAB — CBC WITH DIFFERENTIAL (CANCER CENTER ONLY)
Abs Immature Granulocytes: 0.03 10*3/uL (ref 0.00–0.07)
Basophils Absolute: 0 10*3/uL (ref 0.0–0.1)
Basophils Relative: 1 %
Eosinophils Absolute: 0.1 10*3/uL (ref 0.0–0.5)
Eosinophils Relative: 3 %
HCT: 34.3 % — ABNORMAL LOW (ref 36.0–46.0)
Hemoglobin: 10.9 g/dL — ABNORMAL LOW (ref 12.0–15.0)
Immature Granulocytes: 1 %
Lymphocytes Relative: 26 %
Lymphs Abs: 0.8 10*3/uL (ref 0.7–4.0)
MCH: 26 pg (ref 26.0–34.0)
MCHC: 31.8 g/dL (ref 30.0–36.0)
MCV: 81.7 fL (ref 80.0–100.0)
Monocytes Absolute: 0.3 10*3/uL (ref 0.1–1.0)
Monocytes Relative: 9 %
Neutro Abs: 2 10*3/uL (ref 1.7–7.7)
Neutrophils Relative %: 60 %
Platelet Count: 254 10*3/uL (ref 150–400)
RBC: 4.2 MIL/uL (ref 3.87–5.11)
RDW: 20.2 % — ABNORMAL HIGH (ref 11.5–15.5)
WBC Count: 3.2 10*3/uL — ABNORMAL LOW (ref 4.0–10.5)
nRBC: 0 % (ref 0.0–0.2)

## 2023-05-08 LAB — IRON AND IRON BINDING CAPACITY (CC-WL,HP ONLY)
Iron: 51 ug/dL (ref 28–170)
Saturation Ratios: 11 % (ref 10.4–31.8)
TIBC: 480 ug/dL — ABNORMAL HIGH (ref 250–450)
UIBC: 429 ug/dL (ref 148–442)

## 2023-05-08 NOTE — Assessment & Plan Note (Addendum)
Discovered during her hospitalization in August 2022 (she was hospitalized for severe UTIs) hemoglobin was 6.9 MCV 72 (1 unit of PRBC and IV iron x1 dose) Upper endoscopy and colonoscopy performed November 2022: No evidence of bleeding   IV Iron: Dec 2022, Mar 2023, October 2023, May 2024, October 2024   Lab Review: 08/02/21: WBC: 3.5, Hb 11.3, Ferritin: 20, Iron Sat: 7%, TIBC: 553 11/14/21: Ferritin 103, Hb 12.3, Sat: 11% 02/19/2022: Ferritin 21, iron saturation 7%, TIBC 545, B12 739, hemoglobin 10.6, folate 13.5 06/25/22: Hb 10.9, MCV 82, Iron Sat 8%, TIBC 487, Ferritin 33 10/17/2022: Hemoglobin 7.4, MCV 68.5, RDW 16, ferritin 6, iron saturation 3%, TIBC 633 02/05/2023: Hemoglobin 8.6, MCV 71.2, iron saturation 4%, TIBC 596, ferritin 9 05/08/2023: Hemoglobin 10.9, MCV 81.7, WBC 3.2, iron saturation 11%, ferritin 75   Based on the above lab values I recommended watchful monitoring with rechecking labs in 3 months and telephone visit after that.

## 2023-05-09 LAB — FERRITIN: Ferritin: 75 ng/mL (ref 11–307)

## 2023-05-10 ENCOUNTER — Inpatient Hospital Stay (HOSPITAL_BASED_OUTPATIENT_CLINIC_OR_DEPARTMENT_OTHER): Payer: Federal, State, Local not specified - PPO | Admitting: Hematology and Oncology

## 2023-05-10 DIAGNOSIS — D509 Iron deficiency anemia, unspecified: Secondary | ICD-10-CM | POA: Diagnosis not present

## 2023-05-10 NOTE — Progress Notes (Signed)
HEMATOLOGY-ONCOLOGY TELEPHONE VISIT PROGRESS NOTE  I connected with our patient on 05/10/23 at  8:00 AM EST by telephone and verified that I am speaking with the correct person using two identifiers.  I discussed the limitations, risks, security and privacy concerns of performing an evaluation and management service by telephone and the availability of in person appointments.  I also discussed with the patient that there may be a patient responsible charge related to this service. The patient expressed understanding and agreed to proceed.   History of Present Illness:  Discussed the use of AI scribe software for clinical note transcription with the patient, who gave verbal consent to proceed.  History of Present Illness   The patient, with a history of iron deficiency anemia, presents for a follow-up visit after recent IV iron supplementation. She reports feeling better than her last visit. She has been monitoring her blood work and notes improvements in her hemoglobin and ferritin levels. She expresses hope that the iron supplementation will provide her with enough energy to get through the upcoming holidays.        Oncology History   No history exists.    REVIEW OF SYSTEMS:   Constitutional: Denies fevers, chills or abnormal weight loss All other systems were reviewed with the patient and are negative. Observations/Objective:     Assessment Plan:  Iron deficiency anemia Discovered during her hospitalization in August 2022 (she was hospitalized for severe UTIs) hemoglobin was 6.9 MCV 72 (1 unit of PRBC and IV iron x1 dose) Upper endoscopy and colonoscopy performed November 2022: No evidence of bleeding   IV Iron: Dec 2022, Mar 2023, October 2023, May 2024, October 2024   Lab Review: 08/02/21: WBC: 3.5, Hb 11.3, Ferritin: 20, Iron Sat: 7%, TIBC: 553 11/14/21: Ferritin 103, Hb 12.3, Sat: 11% 02/19/2022: Ferritin 21, iron saturation 7%, TIBC 545, B12 739, hemoglobin 10.6, folate  13.5 06/25/22: Hb 10.9, MCV 82, Iron Sat 8%, TIBC 487, Ferritin 33 10/17/2022: Hemoglobin 7.4, MCV 68.5, RDW 16, ferritin 6, iron saturation 3%, TIBC 633 02/05/2023: Hemoglobin 8.6, MCV 71.2, iron saturation 4%, TIBC 596, ferritin 9 05/08/2023: Hemoglobin 10.9, MCV 81.7, WBC 3.2, iron saturation 11%, ferritin 75   Based on the above lab values I recommended watchful monitoring with rechecking labs in 3 months and telephone visit after that. --------------------------------- Assessment and Plan    Iron Deficiency Anemia Improvement in hemoglobin from 8.6 to 10.9 and ferritin from 9 to 75 after iron supplementation. No current symptoms of anemia. Previous iron supplementation lasted approximately 6 months. - No further iron supplementation needed at this time. - Recheck labs in 3 months to monitor hemoglobin and ferritin levels.          I discussed the assessment and treatment plan with the patient. The patient was provided an opportunity to ask questions and all were answered. The patient agreed with the plan and demonstrated an understanding of the instructions. The patient was advised to call back or seek an in-person evaluation if the symptoms worsen or if the condition fails to improve as anticipated.   I provided 12 minutes of non-face-to-face time during this encounter.  This includes time for charting and coordination of care   Tamsen Meek, MD

## 2023-05-11 ENCOUNTER — Telehealth: Payer: Self-pay | Admitting: Hematology and Oncology

## 2023-05-11 NOTE — Telephone Encounter (Signed)
Spoke with patient confirming upcoming appointment  

## 2023-07-01 ENCOUNTER — Telehealth: Payer: Self-pay

## 2023-07-01 ENCOUNTER — Other Ambulatory Visit (HOSPITAL_COMMUNITY): Payer: Self-pay

## 2023-07-01 NOTE — Telephone Encounter (Signed)
Pharmacy Patient Advocate Encounter  Received notification from [facility] that Prior Authorization for LANSOPRAZOLE 30MG  has been APPROVED from 1.27.25 to 1.27.26. Ran test claim, Copay is $7.50. This test claim was processed through [facility]- copay amounts may vary at other pharmacies due to pharmacy/plan contracts, or as the patient moves through the different stages of their insurance plan.   PA #/Case ID/Reference #: [id number]

## 2023-07-01 NOTE — Telephone Encounter (Signed)
Pharmacy Patient Advocate Encounter   Received notification from CoverMyMeds that prior authorization for Lansoprazole 30 mg dr capsules is required/requested.   Insurance verification completed.   The patient is insured through [facility] .   Per test claim: PA required; PA submitted to above mentioned insurance via CoverMyMeds Key/confirmation #/EOC [id number] Status is pending

## 2023-07-09 ENCOUNTER — Other Ambulatory Visit: Payer: Self-pay | Admitting: Gastroenterology

## 2023-07-09 DIAGNOSIS — D509 Iron deficiency anemia, unspecified: Secondary | ICD-10-CM

## 2023-08-12 ENCOUNTER — Inpatient Hospital Stay: Payer: Federal, State, Local not specified - PPO | Attending: Hematology and Oncology

## 2023-08-12 DIAGNOSIS — D509 Iron deficiency anemia, unspecified: Secondary | ICD-10-CM | POA: Insufficient documentation

## 2023-08-12 LAB — CBC WITH DIFFERENTIAL (CANCER CENTER ONLY)
Abs Immature Granulocytes: 0.02 10*3/uL (ref 0.00–0.07)
Basophils Absolute: 0.1 10*3/uL (ref 0.0–0.1)
Basophils Relative: 1 %
Eosinophils Absolute: 0.1 10*3/uL (ref 0.0–0.5)
Eosinophils Relative: 4 %
HCT: 28.3 % — ABNORMAL LOW (ref 36.0–46.0)
Hemoglobin: 8.6 g/dL — ABNORMAL LOW (ref 12.0–15.0)
Immature Granulocytes: 1 %
Lymphocytes Relative: 25 %
Lymphs Abs: 1 10*3/uL (ref 0.7–4.0)
MCH: 22.6 pg — ABNORMAL LOW (ref 26.0–34.0)
MCHC: 30.4 g/dL (ref 30.0–36.0)
MCV: 74.3 fL — ABNORMAL LOW (ref 80.0–100.0)
Monocytes Absolute: 0.4 10*3/uL (ref 0.1–1.0)
Monocytes Relative: 11 %
Neutro Abs: 2.3 10*3/uL (ref 1.7–7.7)
Neutrophils Relative %: 58 %
Platelet Count: 321 10*3/uL (ref 150–400)
RBC: 3.81 MIL/uL — ABNORMAL LOW (ref 3.87–5.11)
RDW: 14.9 % (ref 11.5–15.5)
WBC Count: 3.8 10*3/uL — ABNORMAL LOW (ref 4.0–10.5)
nRBC: 0 % (ref 0.0–0.2)

## 2023-08-12 LAB — IRON AND IRON BINDING CAPACITY (CC-WL,HP ONLY)
Iron: 19 ug/dL — ABNORMAL LOW (ref 28–170)
Saturation Ratios: 3 % — ABNORMAL LOW (ref 10.4–31.8)
TIBC: 615 ug/dL — ABNORMAL HIGH (ref 250–450)
UIBC: 596 ug/dL — ABNORMAL HIGH (ref 148–442)

## 2023-08-12 LAB — FERRITIN: Ferritin: 8 ng/mL — ABNORMAL LOW (ref 11–307)

## 2023-08-15 ENCOUNTER — Inpatient Hospital Stay (HOSPITAL_BASED_OUTPATIENT_CLINIC_OR_DEPARTMENT_OTHER): Payer: Federal, State, Local not specified - PPO | Admitting: Hematology and Oncology

## 2023-08-15 DIAGNOSIS — D509 Iron deficiency anemia, unspecified: Secondary | ICD-10-CM

## 2023-08-15 NOTE — Progress Notes (Signed)
 HEMATOLOGY-ONCOLOGY TELEPHONE VISIT PROGRESS NOTE  I connected with our patient on 08/15/23 at 11:45 AM EDT by telephone and verified that I am speaking with the correct person using two identifiers.  I discussed the limitations, risks, security and privacy concerns of performing an evaluation and management service by telephone and the availability of in person appointments.  I also discussed with the patient that there may be a patient responsible charge related to this service. The patient expressed understanding and agreed to proceed.   History of Present Illness: Follow-up of iron deficiency anemia  History of Present Illness The patient, with a history of iron deficiency anemia, reports feeling tired. She last received iron in October, and her hemoglobin has since dropped from 10.9 in December to 8.6. She denies any signs of gastrointestinal bleeding, such as blood in the stool or black stools. Despite dietary efforts to increase iron intake, she feels it has not improved her condition. She questions if her kidney disease could be contributing to her anemia, but the doctor clarifies that while kidney disease can cause anemia, it would not cause iron deficiency. The patient expresses frustration and resignation, feeling that she may need to receive iron infusions for the rest of her life.    REVIEW OF SYSTEMS:   Constitutional: Severe generalized fatigue and weakness All other systems were reviewed with the patient and are negative. Observations/Objective:     Assessment Plan:  Iron deficiency anemia Discovered during her hospitalization in August 2022 (she was hospitalized for severe UTIs) hemoglobin was 6.9 MCV 72 (1 unit of PRBC and IV iron x1 dose) Upper endoscopy and colonoscopy performed November 2022: No evidence of bleeding   IV Iron: Dec 2022, Mar 2023, October 2023, May 2024, October 2024   Lab Review: 08/02/21: WBC: 3.5, Hb 11.3, Ferritin: 20, Iron Sat: 7%, TIBC: 553 11/14/21:  Ferritin 103, Hb 12.3, Sat: 11% 02/19/2022: Ferritin 21, iron saturation 7%, TIBC 545, B12 739, hemoglobin 10.6, folate 13.5 06/25/22: Hb 10.9, MCV 82, Iron Sat 8%, TIBC 487, Ferritin 33 10/17/2022: Hemoglobin 7.4, MCV 68.5, RDW 16, ferritin 6, iron saturation 3%, TIBC 633 02/05/2023: Hemoglobin 8.6, MCV 71.2, iron saturation 4%, TIBC 596, ferritin 9 05/08/2023: Hemoglobin 10.9, MCV 81.7, WBC 3.2, iron saturation 11%, ferritin 75 08/12/2023: Hemoglobin 8.6, MCV 74.3, iron saturation 3%, TIBC 615, ferritin 8   Recommended 2 doses of Feraheme at the [facility] infusion clinic. Recheck labs in 3 months and telephone visit after that to discuss results.  --------------------------------- Assessment and Plan Assessment & Plan Iron Deficiency Anemia Iron deficiency anemia with hemoglobin drop from 10.9 to 8.6. Previous GI evaluations negative for bleeding, indicating poor iron absorption. Fatigue reported. IV iron necessary due to inadequate dietary absorption. - Administer Feraheme (IV iron) in two infusions at [facility]. - Provide address for [facility]. - Instruct her to try liquid iron supplementation at home for three months. - Schedule follow-up blood work every three months to monitor hemoglobin levels.      I discussed the assessment and treatment plan with the patient. The patient was provided an opportunity to ask questions and all were answered. The patient agreed with the plan and demonstrated an understanding of the instructions. The patient was advised to call back or seek an in-person evaluation if the symptoms worsen or if the condition fails to improve as anticipated.   I provided 20 minutes of non-face-to-face time during this encounter.  This includes time for charting and coordination of care  Tamsen Meek, MD

## 2023-08-15 NOTE — Assessment & Plan Note (Signed)
 Discovered during her hospitalization in August 2022 (she was hospitalized for severe UTIs) hemoglobin was 6.9 MCV 72 (1 unit of PRBC and IV iron x1 dose) Upper endoscopy and colonoscopy performed November 2022: No evidence of bleeding   IV Iron: Dec 2022, Mar 2023, October 2023, May 2024, October 2024   Lab Review: 08/02/21: WBC: 3.5, Hb 11.3, Ferritin: 20, Iron Sat: 7%, TIBC: 553 11/14/21: Ferritin 103, Hb 12.3, Sat: 11% 02/19/2022: Ferritin 21, iron saturation 7%, TIBC 545, B12 739, hemoglobin 10.6, folate 13.5 06/25/22: Hb 10.9, MCV 82, Iron Sat 8%, TIBC 487, Ferritin 33 10/17/2022: Hemoglobin 7.4, MCV 68.5, RDW 16, ferritin 6, iron saturation 3%, TIBC 633 02/05/2023: Hemoglobin 8.6, MCV 71.2, iron saturation 4%, TIBC 596, ferritin 9 05/08/2023: Hemoglobin 10.9, MCV 81.7, WBC 3.2, iron saturation 11%, ferritin 75 08/12/2023: Hemoglobin 8.6, MCV 74.3, iron saturation 3%, TIBC 615, ferritin 8   Recommended 2 doses of Feraheme at the [facility] infusion clinic. Recheck labs in 3 months and telephone visit after that to discuss results.

## 2023-08-16 ENCOUNTER — Telehealth: Payer: Self-pay | Admitting: Hematology and Oncology

## 2023-08-16 ENCOUNTER — Telehealth: Payer: Self-pay | Admitting: Pharmacy Technician

## 2023-08-16 NOTE — Telephone Encounter (Signed)
 Auth Submission: NO AUTH NEEDED Site of care: Site of care: [facility] Payer: [employer]AL Medication & CPT/J Code(s) submitted: Feraheme (ferumoxytol) [id number] Route of submission (phone, fax, portal):  Phone # Fax # Auth type: Buy/Bill PB Units/visits requested: 2 DOSES Reference number: [id number] Approval from: 08/16/23 to 01/16/24

## 2023-08-16 NOTE — Telephone Encounter (Signed)
Scheduled appointments per 3/13 los. Patient is aware of the made appointments.

## 2023-08-19 ENCOUNTER — Other Ambulatory Visit: Payer: Self-pay | Admitting: Hematology and Oncology

## 2023-08-20 ENCOUNTER — Telehealth: Payer: Self-pay

## 2023-08-20 ENCOUNTER — Other Ambulatory Visit: Payer: Self-pay | Admitting: Hematology and Oncology

## 2023-08-20 NOTE — Telephone Encounter (Signed)
 Dr. Pamelia Hoit, patient will be scheduled as soon as possible.  Auth Submission: NO AUTH NEEDED Site of care: Site of care: [facility] Payer: BCBS FEP  Medication & CPT/J Code(s) submitted: Feraheme (ferumoxytol) [id number] Route of submission (phone, fax, portal): phone Phone # [phone] Fax # Auth type: Buy/Bill PB Units/visits requested: 510mg  x 2 doses Reference number: [id number] Approval from: 08/20/23 to 02/20/24

## 2023-08-21 ENCOUNTER — Ambulatory Visit

## 2023-08-21 VITALS — BP 154/68 | HR 79 | Temp 98.0°F | Resp 18 | Ht 60.0 in | Wt 157.8 lb

## 2023-08-21 DIAGNOSIS — D509 Iron deficiency anemia, unspecified: Secondary | ICD-10-CM

## 2023-08-21 MED ORDER — FERUMOXYTOL INJECTION 510 MG/17 ML
510.0000 mg | Freq: Once | INTRAVENOUS | Status: AC
  Administered 2023-08-21: 510 mg via INTRAVENOUS
  Filled 2023-08-21: qty 17

## 2023-08-21 NOTE — Progress Notes (Signed)
 Diagnosis: Iron Deficiency Anemia  Provider:  Chilton Greathouse MD  Procedure: IV Infusion  IV Type: Peripheral, IV Location: L Antecubital  Feraheme (Ferumoxytol), Dose: 510 mg  Infusion Start Time: 1412  Infusion Stop Time: 1428  Post Infusion IV Care: Observation period completed and Peripheral IV Discontinued  Pt requested 15 min obs  Discharge: Condition: Good, Destination: Home . AVS Provided  Performed by:  Adriana Mccallum, RN

## 2023-08-21 NOTE — Patient Instructions (Signed)

## 2023-08-28 ENCOUNTER — Ambulatory Visit

## 2023-08-28 VITALS — BP 146/67 | HR 83 | Temp 98.7°F | Resp 14 | Ht 60.0 in | Wt 153.6 lb

## 2023-08-28 DIAGNOSIS — D509 Iron deficiency anemia, unspecified: Secondary | ICD-10-CM

## 2023-08-28 MED ORDER — SODIUM CHLORIDE 0.9 % IV SOLN
510.0000 mg | Freq: Once | INTRAVENOUS | Status: AC
  Administered 2023-08-28: 510 mg via INTRAVENOUS
  Filled 2023-08-28: qty 17

## 2023-08-28 NOTE — Progress Notes (Signed)
 Diagnosis: Iron Deficiency Anemia  Provider:  Chilton Greathouse MD  Procedure: IV Infusion  IV Type: Peripheral, IV Location: L Antecubital  Feraheme (Ferumoxytol), Dose: 510 mg  Infusion Start Time: 1142  Infusion Stop Time: 1203  Post Infusion IV Care: Patient declined observation and Peripheral IV Discontinued  Discharge: Condition: Good, Destination: Home . AVS Provided  Performed by:  Rico Ala, LPN

## 2023-09-02 ENCOUNTER — Other Ambulatory Visit: Payer: Self-pay | Admitting: Gastroenterology

## 2023-09-02 DIAGNOSIS — D509 Iron deficiency anemia, unspecified: Secondary | ICD-10-CM

## 2023-09-03 ENCOUNTER — Ambulatory Visit: Payer: Self-pay

## 2023-09-03 ENCOUNTER — Other Ambulatory Visit: Payer: Self-pay | Admitting: Gastroenterology

## 2023-09-03 DIAGNOSIS — D509 Iron deficiency anemia, unspecified: Secondary | ICD-10-CM

## 2023-09-03 NOTE — Telephone Encounter (Signed)
  Chief Complaint: fatigue/weakness Symptoms: fatigue/generalized weakness, nausea, mild dizziness Frequency: x 1 -2 weeks Pertinent Negatives: Patient denies vomiting, diarrhea, chest pain, fever Disposition: [] ED /[] Urgent Care (no appt availability in office) / [x] Appointment(In office/virtual)/ []  [place] [facility]/ [] Home Care/ [x] Refused Recommended Disposition /[] [place] [facility]/ []  Follow-up with PCP Additional Notes: Patient states the last 2 times she has had her new iron infusions she feels nauseated and dizzy the next day. March 26th was the last iron infusion, and she states she does not want to continue with the new infusion. Patient advised to make an appt with her PCP, patient refused and states she would like her PCP to call her back. Patient is not established with [facility] PCP. Called and spoke with RN for [facility] PCP to inform them of patient's symptoms.  Copied from CRM [id number]. Topic: Clinical - Red Word Triage >> Sep 03, 2023  2:53 PM Shirley Juarez wrote: [facility] that prompted transfer to Nurse Triage: different iron and had two back to back . march for 26th for iron infusion, next morning would feel reall dizzy, feel as tired as before the two. [place]. Reason for Disposition  [1] MILD weakness (i.e., does not interfere with ability to work, go to school, normal activities) AND [2] persists > 1 week  Answer Assessment - Initial Assessment Questions 1. DESCRIPTION: "Describe how you are feeling."     She states it is a tired, sick, weak feeling.  2. SEVERITY: "How bad is it?"  "Can you stand and walk?"   - MILD (0-3): Feels weak or tired, but does not interfere with work, school or normal activities.   - MODERATE (4-7): Able to stand and walk; weakness interferes with work, school, or normal activities.   - SEVERE (8-10): Unable to stand or walk; unable to do usual activities.     Mild, she states she is able to stand and walk but she  gets tired and has to sit or lie down.  3. ONSET: "When did these symptoms begin?" (e.g., hours, days, weeks, months)     X Couple of weeks.  4. CAUSE: "What do you think is causing the weakness or fatigue?" (e.g., not drinking enough fluids, medical problem, trouble sleeping)     She states her low iron usually makes her feel fatigue and weak but the infusions help/improve but the new infusion have not helped.  5. NEW MEDICINES:  "Have you started on any new medicines recently?" (e.g., opioid pain medicines, benzodiazepines, muscle relaxants, antidepressants, antihistamines, neuroleptics, beta blockers)     New iron infusion.  6. OTHER SYMPTOMS: "Do you have any other symptoms?" (e.g., chest pain, fever, cough, SOB, vomiting, diarrhea, bleeding, other areas of pain)     "Little bit" of dizziness, nausea (treating with zofran), She states she had some difficulty breathing after her iron infusion and it lasted a few days (she states she is breathing okay now and it has resolved).  7. PREGNANCY: "Is there any chance you are pregnant?" "When was your last menstrual period?"     N/A.  Protocols used: Weakness (Generalized) and Fatigue-A-AH

## 2023-09-09 ENCOUNTER — Ambulatory Visit: Payer: Federal, State, Local not specified - PPO | Admitting: Dermatology

## 2023-09-09 ENCOUNTER — Encounter: Payer: Self-pay | Admitting: Dermatology

## 2023-09-09 VITALS — BP 128/66 | HR 80

## 2023-09-09 DIAGNOSIS — L821 Other seborrheic keratosis: Secondary | ICD-10-CM

## 2023-09-09 DIAGNOSIS — L409 Psoriasis, unspecified: Secondary | ICD-10-CM

## 2023-09-09 MED ORDER — OTEZLA 30 MG PO TABS: 30.0000 mg | ORAL_TABLET | Freq: Two times a day (BID) | ORAL | 5 refills | Status: DC

## 2023-09-09 MED ORDER — CLOBETASOL PROPIONATE 0.05 % EX SOLN: CUTANEOUS | 3 refills | Status: DC

## 2023-09-09 NOTE — Patient Instructions (Signed)
 Hello Shirley Juarez,  Thank you for visiting today. Here is a summary of the key instructions:  Scalp Psoriasis  - DHS Zinc Shampoo: Use to help calm scalp inflammation  - Clobetasol Drops for Scalp:   - Use within 10 minutes to stop itching   - Apply for 1-2 weeks to clear flaky spots  - Otezla Pills:   - Start with 10 mg on day 1   - Take 10 mg twice daily on day 2   - Increase dose gradually to 30 mg twice daily   - Use starter packs until insurance approves prescription  - Side Effects:   - [place] may cause diarrhea:     - Take Imodium if needed   - Stop Henderson [place] and call us if you notice mood changes  - Follow-up: Return for a check-up in 3-4 months  - Additional Instructions:   - Scan the QR code on the Otezla starter pack to enroll in cost savings program   - Expect a call from [place] pharmacy once Henderson [place] is approved   - Michelene Heady will mail Henderson [place] to your home  Please reach out if you have any questions or concerns.  Warm regards,  Dr. [facility]     Important Information  Due to recent changes in healthcare laws, you may see results of your pathology and/or laboratory studies on MyChart before the doctors have had a chance to review them. We understand that in some cases there may be results that are confusing or concerning to you. Please understand that not all results are received at the same time and often the doctors may need to interpret multiple results in order to provide you with the best plan of care or course of treatment. Therefore, we ask that you please give [name] 2 business days to thoroughly review all your results before contacting the office for clarification. Should we see a critical lab result, you will be contacted sooner.   If You Need Anything After Your Visit  If you have any questions or concerns for your doctor, please call our main line at [phone] If no one answers, please leave a voicemail as directed and we will return your  call as soon as possible. Messages left after 4 pm will be answered the following business day.   You may also send [name] a message via MyChart. We typically respond to MyChart messages within 1-2 business days.  For prescription refills, please ask your pharmacy to contact our office. Our fax number is [phone].  If you have an urgent issue when the clinic is closed that cannot wait until the next business day, you can page your doctor at the number below.    Please note that while we do our best to be available for urgent issues outside of office hours, we are not available 24/7.   If you have an urgent issue and are unable to reach [name], you may choose to seek medical care at your doctor's office, retail clinic, urgent care center, or emergency room.  If you have a medical emergency, please immediately call 911 or go to the emergency department. In the event of inclement weather, please call our main line at [phone] for an update on the status of any delays or closures.  Dermatology Medication Tips: Please keep the boxes that topical medications come in in order to help keep track of the instructions about where and how to use these. Pharmacies typically print the medication instructions only on the  boxes and not directly on the medication tubes.   If your medication is too expensive, please contact our office at [phone] or send [name] a message through MyChart.   We are unable to tell what your co-pay for medications will be in advance as this is different depending on your insurance coverage. However, we may be able to find a substitute medication at lower cost or fill out paperwork to get insurance to cover a needed medication.   If a prior authorization is required to get your medication covered by your insurance company, please allow [name] 1-2 business days to complete this process.  Drug prices often vary depending on where the prescription is filled and some pharmacies may offer  cheaper prices.  The website www.goodrx.com contains coupons for medications through different pharmacies. The prices here do not account for what the cost may be with help from insurance (it may be cheaper with your insurance), but the website can give you the price if you did not use any insurance.  - You can print the associated coupon and take it with your prescription to the pharmacy.  - You may also stop by our office during regular business hours and pick up a GoodRx coupon card.  - If you need your prescription sent electronically to a different pharmacy, notify our office through [facility] or by phone at [phone]

## 2023-09-09 NOTE — Progress Notes (Signed)
 New Patient Visit   Subjective  Shirley Juarez is a 73 y.o. female who presents for the following: scalp issues. Unsure if psoriasis. Dur: several years, worsening. Causing hair to fall out. Has tried OTC treatments and prescription treatments. Has used Ketoconazole 2% shampoo. States these treatments help but very little. Denies outbreaks on elbows or knees.  Has to get iron infusions, states worsens after infusion.   Check spot at groin area. Has been there for several years. Beginning to grow. Raised, crusted. Rubbed by clothing at times.   The patient has spots, moles and lesions to be evaluated, some may be new or changing and the patient may have concern these could be cancer.   The following portions of the chart were reviewed this encounter and updated as appropriate: medications, allergies, medical history  Review of Systems:  No other skin or systemic complaints except as noted in HPI or Assessment and Plan.  Objective  Well appearing patient in no apparent distress; mood and affect are within normal limits.  A focused examination was performed of the following areas: Scalp, face, suprapubic  Relevant exam findings are noted in the Assessment and Plan.                   Assessment & Plan   SEBORRHEIC KERATOSIS - Stuck-on, waxy, tan plaque at right suprapubic area - Benign-appearing - Discussed benign etiology and prognosis. 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 9-10% BSA.  Chronic and persistent condition with duration or expected duration over one year. Condition is bothersome/symptomatic for patient. Currently flared.   patient denies joint pain  Psoriasis is a chronic non-curable, but treatable genetic/hereditary disease that may have other systemic features affecting other organ systems such as joints (Psoriatic Arthritis). It is associated with an increased risk of inflammatory bowel disease,  heart disease, non-alcoholic fatty liver disease, and depression.  Treatments include light and laser treatments; topical medications; and systemic medications including oral and injectables.  - Assessment: Pink scaly plaques scattered throughout the scalp, consistent with psoriasis. Condition present for at least a year, with minimal improvement from ketoconazole shampoo. No family history of psoriasis reported, and no psoriatic lesions noted on elbows or knees. Patient's iron deficiency and subsequent iron injections may be contributing to hair loss.  - Plan:    Discontinue ketoconazole shampoo    Start DHS Zinc shampoo for inflammation reduction    Prescribe clobetasol solution for topical application on affected areas    Initiate Otezla (apremilast) oral medication:     - Provide starter pack with titration schedule (10mg  to 30mg  twice daily)     - Enroll patient in bridge program for potential cost reduction     - Monitor for side effects, particularly diarrhea and mood changes    If topical treatments are insufficient, consider oral medication options at follow-up    Follow-up appointment in 3 months   Otezla sample pack x2 given.  Lot: [id number] Exp: 01/01/2025  Start Otezla 30 mg twice daily.    Side effects of Otezla (apremilast) include diarrhea, nausea, headache, upper respiratory infection, depression, and weight decrease (5-10%). It should only be taken by pregnant women after a discussion regarding risks and benefits with their doctor. Goal is control of skin condition, not cure.  The use of Otezla requires long term medication management, including periodic office visits.   Start Clobetasol solution once or twice daily as needed  for scaling/itching on scalp. Avoid applying to face, groin, and axilla. Use as directed. Long-term use can cause thinning of the skin.          Return in about 3 months (around 12/09/2023) for Psoriasis Follow Up.  I, Darcie Easterly, CMA, am  acting as scribe for [employer], DO.   Documentation: I have reviewed the above documentation for accuracy and completeness, and I agree with the above.  Louana Roup, DO

## 2023-09-16 ENCOUNTER — Other Ambulatory Visit: Payer: Self-pay

## 2023-09-16 MED ORDER — OTEZLA 30 MG PO TABS: 30.0000 mg | ORAL_TABLET | Freq: Two times a day (BID) | ORAL | 5 refills | Status: DC

## 2023-09-27 ENCOUNTER — Encounter: Payer: Self-pay | Admitting: Pharmacist

## 2023-10-21 ENCOUNTER — Telehealth: Payer: Self-pay | Admitting: Hematology and Oncology

## 2023-10-21 NOTE — Telephone Encounter (Signed)
 Left vm for pt about rescheduled telephone visit date and time

## 2023-11-01 ENCOUNTER — Inpatient Hospital Stay: Attending: Hematology and Oncology

## 2023-11-01 DIAGNOSIS — D509 Iron deficiency anemia, unspecified: Secondary | ICD-10-CM | POA: Diagnosis present

## 2023-11-01 LAB — CBC WITH DIFFERENTIAL (CANCER CENTER ONLY)
Abs Immature Granulocytes: 0.01 10*3/uL (ref 0.00–0.07)
Basophils Absolute: 0.1 10*3/uL (ref 0.0–0.1)
Basophils Relative: 2 %
Eosinophils Absolute: 0.1 10*3/uL (ref 0.0–0.5)
Eosinophils Relative: 6 %
HCT: 26.3 % — ABNORMAL LOW (ref 36.0–46.0)
Hemoglobin: 8.5 g/dL — ABNORMAL LOW (ref 12.0–15.0)
Immature Granulocytes: 0 %
Lymphocytes Relative: 32 %
Lymphs Abs: 0.7 10*3/uL (ref 0.7–4.0)
MCH: 24.4 pg — ABNORMAL LOW (ref 26.0–34.0)
MCHC: 32.3 g/dL (ref 30.0–36.0)
MCV: 75.6 fL — ABNORMAL LOW (ref 80.0–100.0)
Monocytes Absolute: 0.3 10*3/uL (ref 0.1–1.0)
Monocytes Relative: 12 %
Neutro Abs: 1.1 10*3/uL — ABNORMAL LOW (ref 1.7–7.7)
Neutrophils Relative %: 48 %
Platelet Count: 327 10*3/uL (ref 150–400)
RBC: 3.48 MIL/uL — ABNORMAL LOW (ref 3.87–5.11)
RDW: 19.8 % — ABNORMAL HIGH (ref 11.5–15.5)
WBC Count: 2.3 10*3/uL — ABNORMAL LOW (ref 4.0–10.5)
nRBC: 0 % (ref 0.0–0.2)

## 2023-11-01 LAB — IRON AND IRON BINDING CAPACITY (CC-WL,HP ONLY)
Iron: 19 ug/dL — ABNORMAL LOW (ref 28–170)
Saturation Ratios: 4 % — ABNORMAL LOW (ref 10.4–31.8)
TIBC: 532 ug/dL — ABNORMAL HIGH (ref 250–450)
UIBC: 513 ug/dL — ABNORMAL HIGH (ref 148–442)

## 2023-11-01 LAB — FERRITIN: Ferritin: 23 ng/mL (ref 11–307)

## 2023-11-05 ENCOUNTER — Inpatient Hospital Stay: Attending: Hematology and Oncology | Admitting: Hematology and Oncology

## 2023-11-05 DIAGNOSIS — D509 Iron deficiency anemia, unspecified: Secondary | ICD-10-CM | POA: Diagnosis not present

## 2023-11-05 NOTE — Assessment & Plan Note (Signed)
 Discovered during her hospitalization in August 2022 (she was hospitalized for severe UTIs) hemoglobin was 6.9 MCV 72 (1 unit of PRBC and IV iron  x1 dose) Upper endoscopy and colonoscopy performed November 2022: No evidence of bleeding   IV Iron : Dec 2022, Mar 2023, October 2023, May 2024, October 2024, March 2025   Lab Review: 08/02/21: WBC: 3.5, Hb 11.3, Ferritin: 20, Iron  Sat: 7%, TIBC: 553 11/14/21: Ferritin 103, Hb 12.3, Sat: 11% 02/19/2022: Ferritin 21, iron  saturation 7%, TIBC 545, B12 739, hemoglobin 10.6, folate 13.5 06/25/22: Hb 10.9, MCV 82, Iron  Sat 8%, TIBC 487, Ferritin 33 10/17/2022: Hemoglobin 7.4, MCV 68.5, RDW 16, ferritin 6, iron  saturation 3%, TIBC 633 02/05/2023: Hemoglobin 8.6, MCV 71.2, iron  saturation 4%, TIBC 596, ferritin 9 05/08/2023: Hemoglobin 10.9, MCV 81.7, WBC 3.2, iron  saturation 11%, ferritin 75 08/12/2023: Hemoglobin 8.6, MCV 74.3, iron  saturation 3%, TIBC 615, ferritin 8 11/01/2023: Hemoglobin 8.5, MCV 75.6, iron  saturation 4%, ferritin 23  Recommendation: Follow-up with gastroenterology   Recommended 2 more doses of Feraheme at the [facility] infusion clinic. Recheck labs in 3 months and telephone visit after that to discuss results.

## 2023-11-05 NOTE — Progress Notes (Signed)
 HEMATOLOGY-ONCOLOGY TELEPHONE VISIT PROGRESS NOTE  I connected with our patient on 11/05/23 at  9:45 AM EDT by telephone and verified that I am speaking with the correct person using two identifiers.  I discussed the limitations, risks, security and privacy concerns of performing an evaluation and management service by telephone and the availability of in person appointments.  I also discussed with the patient that there may be a patient responsible charge related to this service. The patient expressed understanding and agreed to proceed.   History of Present Illness:   History of Present Illness Shirley Juarez "Shirley Juarez" is a 73 year old female who presents with persistent fatigue.  She experiences significant fatigue, limiting her ability to engage in activities for more than five minutes before needing to rest. Her energy levels improve following iron  infusions, which allow her to be more active. When her iron  levels are low, her fatigue returns along with symptoms such as 'eye cravings.'  Her last gastroenterology evaluation, including a colonoscopy, was in 2022. She denies any visible blood in her stool. Despite treatment, her iron  saturation remains low at 4%, and her hemoglobin is 8.5 g/dL. Her ferritin levels have improved from 8 to 23 ng/mL.      REVIEW OF SYSTEMS:   Constitutional: Denies fevers, chills or abnormal weight loss All other systems were reviewed with the patient and are negative. Observations/Objective:     Assessment Plan:  Iron  deficiency anemia Discovered during her hospitalization in August 2022 (she was hospitalized for severe UTIs) hemoglobin was 6.9 MCV 72 (1 unit of PRBC and IV iron  x1 dose) Upper endoscopy and colonoscopy performed November 2022: No evidence of bleeding   IV Iron : Dec 2022, Mar 2023, October 2023, May 2024, October 2024, March 2025   Lab Review: 08/02/21: WBC: 3.5, Hb 11.3, Ferritin: 20, Iron  Sat: 7%, TIBC: 553 11/14/21: Ferritin 103, Hb  12.3, Sat: 11% 02/19/2022: Ferritin 21, iron  saturation 7%, TIBC 545, B12 739, hemoglobin 10.6, folate 13.5 06/25/22: Hb 10.9, MCV 82, Iron  Sat 8%, TIBC 487, Ferritin 33 10/17/2022: Hemoglobin 7.4, MCV 68.5, RDW 16, ferritin 6, iron  saturation 3%, TIBC 633 02/05/2023: Hemoglobin 8.6, MCV 71.2, iron  saturation 4%, TIBC 596, ferritin 9 05/08/2023: Hemoglobin 10.9, MCV 81.7, WBC 3.2, iron  saturation 11%, ferritin 75 08/12/2023: Hemoglobin 8.6, MCV 74.3, iron  saturation 3%, TIBC 615, ferritin 8 11/01/2023: Hemoglobin 8.5, MCV 75.6, iron  saturation 4%, ferritin 23  Recommendation: Discussed following up with gastroenterology but since she does not have any symptoms, she wants to wait on GI referral.   Recommended Monoferric at the [facility] market infusion clinic. Recheck labs in 1 month and telephone visit after that to discuss results. 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 Recent iron  infusion improved ferritin levels, but iron  saturation and hemoglobin remain low. Further evaluation by gastroenterology is warranted to rule out potential sources of iron  loss. - Administer Monoferric (1000 mg iron ) infusion at [facility] location. - Schedule follow-up lab work in one month to assess hemoglobin and iron  levels. - Refer to gastroenterology for further evaluation of potential sources of iron  loss.      I discussed the assessment and treatment plan with the patient. The patient was provided an opportunity to ask questions and all were answered. The patient agreed with the plan and demonstrated an understanding of the instructions. The patient was advised to call back or seek an in-person evaluation if the symptoms worsen or  if the condition fails to improve as anticipated.   I provided 20 minutes of non-face-to-face time during this encounter.  This includes time for charting and  coordination of care   Margert Sheerer, MD

## 2023-11-07 ENCOUNTER — Telehealth: Payer: Self-pay

## 2023-11-07 NOTE — Telephone Encounter (Signed)
 Dr. Gudena, patient will be scheduled as soon as possible.  Auth Submission: NO AUTH NEEDED Site of care: Site of care: [facility] Payer: BCBS FEP Medication & CPT/J Code(s) submitted: Monoferric (Ferrci derisomaltose) [phone] Route of submission (phone, fax, portal): phone Phone # [phone] Fax # Auth type: Buy/Bill PB Units/visits requested: 1000mg  x 1 dose Reference number: [id number] Approval from: 11/07/23 to 02/07/24

## 2023-11-12 ENCOUNTER — Ambulatory Visit: Admitting: *Deleted

## 2023-11-12 VITALS — BP 126/63 | HR 87 | Temp 98.0°F | Resp 16 | Ht 60.0 in | Wt 146.4 lb

## 2023-11-12 DIAGNOSIS — D509 Iron deficiency anemia, unspecified: Secondary | ICD-10-CM

## 2023-11-12 MED ORDER — SODIUM CHLORIDE 0.9 % IV SOLN
1000.0000 mg | Freq: Once | INTRAVENOUS | Status: AC
  Administered 2023-11-12: 1000 mg via INTRAVENOUS
  Filled 2023-11-12: qty 10

## 2023-11-12 NOTE — Patient Instructions (Signed)

## 2023-11-12 NOTE — Progress Notes (Signed)
 Diagnosis: Iron  Deficiency Anemia  Provider:  Mannam, Praveen MD  Procedure: IV Infusion  IV Type: Peripheral, IV Location: L Antecubital  Monoferric (Ferric Derisomaltose), Dose: 1000 mg  Infusion Start Time: 1114 am  Infusion Stop Time: 1143 am  Post Infusion IV Care: Observation period completed and Peripheral IV Discontinued  Discharge: Condition: Good, Destination: Home . AVS Provided  Performed by:  Mayme Spearman, RN

## 2023-11-18 ENCOUNTER — Other Ambulatory Visit

## 2023-11-20 ENCOUNTER — Telehealth: Admitting: Hematology and Oncology

## 2023-11-21 ENCOUNTER — Telehealth: Admitting: Adult Health

## 2023-12-02 ENCOUNTER — Other Ambulatory Visit: Payer: Self-pay | Admitting: Cardiovascular Disease

## 2023-12-05 ENCOUNTER — Inpatient Hospital Stay: Attending: Hematology and Oncology

## 2023-12-05 DIAGNOSIS — D72819 Decreased white blood cell count, unspecified: Secondary | ICD-10-CM | POA: Insufficient documentation

## 2023-12-05 DIAGNOSIS — N3090 Cystitis, unspecified without hematuria: Secondary | ICD-10-CM | POA: Insufficient documentation

## 2023-12-05 DIAGNOSIS — D509 Iron deficiency anemia, unspecified: Secondary | ICD-10-CM | POA: Insufficient documentation

## 2023-12-05 LAB — CBC WITH DIFFERENTIAL (CANCER CENTER ONLY)
Abs Immature Granulocytes: 0.02 10*3/uL (ref 0.00–0.07)
Basophils Absolute: 0 10*3/uL (ref 0.0–0.1)
Basophils Relative: 1 %
Eosinophils Absolute: 0.1 10*3/uL (ref 0.0–0.5)
Eosinophils Relative: 4 %
HCT: 23.5 % — ABNORMAL LOW (ref 36.0–46.0)
Hemoglobin: 7.3 g/dL — ABNORMAL LOW (ref 12.0–15.0)
Immature Granulocytes: 1 %
Lymphocytes Relative: 38 %
Lymphs Abs: 0.7 10*3/uL (ref 0.7–4.0)
MCH: 26 pg (ref 26.0–34.0)
MCHC: 31.1 g/dL (ref 30.0–36.0)
MCV: 83.6 fL (ref 80.0–100.0)
Monocytes Absolute: 0.2 10*3/uL (ref 0.1–1.0)
Monocytes Relative: 11 %
Neutro Abs: 0.8 10*3/uL — ABNORMAL LOW (ref 1.7–7.7)
Neutrophils Relative %: 45 %
Platelet Count: 216 10*3/uL (ref 150–400)
RBC: 2.81 MIL/uL — ABNORMAL LOW (ref 3.87–5.11)
RDW: 21.2 % — ABNORMAL HIGH (ref 11.5–15.5)
WBC Count: 1.8 10*3/uL — ABNORMAL LOW (ref 4.0–10.5)
nRBC: 0 % (ref 0.0–0.2)

## 2023-12-05 LAB — IRON AND IRON BINDING CAPACITY (CC-WL,HP ONLY)
Iron: 27 ug/dL — ABNORMAL LOW (ref 28–170)
Saturation Ratios: 6 % — ABNORMAL LOW (ref 10.4–31.8)
TIBC: 435 ug/dL (ref 250–450)
UIBC: 408 ug/dL (ref 148–442)

## 2023-12-05 LAB — FERRITIN: Ferritin: 140 ng/mL (ref 11–307)

## 2023-12-08 NOTE — Assessment & Plan Note (Signed)
 Discovered during her hospitalization in August 2022 (she was hospitalized for severe UTIs) hemoglobin was 6.9 MCV 72 (1 unit of PRBC and IV iron  x1 dose) Upper endoscopy and colonoscopy performed November 2022: No evidence of bleeding   IV Iron : Dec 2022, Mar 2023, October 2023, May 2024, October 2024, March 2025, June 2025   Lab Review: 08/02/21: WBC: 3.5, Hb 11.3, Ferritin: 20, Iron  Sat: 7%, TIBC: 553 11/14/21: Ferritin 103, Hb 12.3, Sat: 11% 02/19/2022: Ferritin 21, iron  saturation 7%, TIBC 545, B12 739, hemoglobin 10.6, folate 13.5 06/25/22: Hb 10.9, MCV 82, Iron  Sat 8%, TIBC 487, Ferritin 33 10/17/2022: Hemoglobin 7.4, MCV 68.5, RDW 16, ferritin 6, iron  saturation 3%, TIBC 633 02/05/2023: Hemoglobin 8.6, MCV 71.2, iron  saturation 4%, TIBC 596, ferritin 9 05/08/2023: Hemoglobin 10.9, MCV 81.7, WBC 3.2, iron  saturation 11%, ferritin 75 08/12/2023: Hemoglobin 8.6, MCV 74.3, iron  saturation 3%, TIBC 615, ferritin 8 11/01/2023: Hemoglobin 8.5, MCV 75.6, iron  saturation 4%, ferritin 23 12/05/23: Hemoglobin: 7.3, MCV 83.6, WBC: 1.8, ANC 0.8, Iron  sat: 6%, Ferritin 140   Recommendation: Inspite of improving iron  studies, there is persistent anemia and worsening leukopenia. I recommend Bone marrow Biopsy

## 2023-12-09 ENCOUNTER — Encounter: Payer: Self-pay | Admitting: Dermatology

## 2023-12-09 ENCOUNTER — Ambulatory Visit (INDEPENDENT_AMBULATORY_CARE_PROVIDER_SITE_OTHER): Admitting: Dermatology

## 2023-12-09 ENCOUNTER — Inpatient Hospital Stay (HOSPITAL_BASED_OUTPATIENT_CLINIC_OR_DEPARTMENT_OTHER): Admitting: Hematology and Oncology

## 2023-12-09 VITALS — BP 137/84 | HR 81

## 2023-12-09 DIAGNOSIS — D492 Neoplasm of unspecified behavior of bone, soft tissue, and skin: Secondary | ICD-10-CM

## 2023-12-09 DIAGNOSIS — L859 Epidermal thickening, unspecified: Secondary | ICD-10-CM

## 2023-12-09 DIAGNOSIS — L409 Psoriasis, unspecified: Secondary | ICD-10-CM

## 2023-12-09 DIAGNOSIS — D72819 Decreased white blood cell count, unspecified: Secondary | ICD-10-CM | POA: Diagnosis not present

## 2023-12-09 DIAGNOSIS — S0001XA Abrasion of scalp, initial encounter: Secondary | ICD-10-CM | POA: Diagnosis not present

## 2023-12-09 DIAGNOSIS — L821 Other seborrheic keratosis: Secondary | ICD-10-CM

## 2023-12-09 DIAGNOSIS — D703 Neutropenia due to infection: Secondary | ICD-10-CM

## 2023-12-09 DIAGNOSIS — D485 Neoplasm of uncertain behavior of skin: Secondary | ICD-10-CM

## 2023-12-09 DIAGNOSIS — D509 Iron deficiency anemia, unspecified: Secondary | ICD-10-CM | POA: Diagnosis not present

## 2023-12-09 MED ORDER — OTEZLA 30 MG PO TABS: 30.0000 mg | ORAL_TABLET | Freq: Two times a day (BID) | ORAL | 5 refills | Status: DC

## 2023-12-09 MED ORDER — CLOBETASOL PROPIONATE 0.05 % EX SOLN: CUTANEOUS | 3 refills | Status: AC

## 2023-12-09 NOTE — Progress Notes (Signed)
 HEMATOLOGY-ONCOLOGY TELEPHONE VISIT PROGRESS NOTE  I connected with our patient on 12/09/23 at  9:00 AM EDT by telephone and verified that I am speaking with the correct person using two identifiers.  I discussed the limitations, risks, security and privacy concerns of performing an evaluation and management service by telephone and the availability of in person appointments.  I also discussed with the patient that there may be a patient responsible charge related to this service. The patient expressed understanding and agreed to proceed.   History of Present Illness: Follow-up to discuss results of recent blood work after replenishing her iron   History of Present Illness Shirley Juarez Shirley Juarez is a 73 year old female who presents with worsening anemia despite iron  supplementation.  She experiences worsening anemia despite IV iron  supplementation in June. Her ferritin levels improved to 140, but hemoglobin continues to decrease, currently at 7.3. Her white blood cell count has decreased to 1.8 from her usual level of around 3. She recently completed a course of antibiotics for a bladder infection.     REVIEW OF SYSTEMS:   Constitutional: Denies fevers, chills or abnormal weight loss All other systems were reviewed with the patient and are negative. Observations/Objective:     Assessment Plan:  Iron  deficiency anemia Discovered during her hospitalization in August 2022 (she was hospitalized for severe UTIs) hemoglobin was 6.9 MCV 72 (1 unit of PRBC and IV iron  x1 dose) Upper endoscopy and colonoscopy performed November 2022: No evidence of bleeding   IV Iron : Dec 2022, Mar 2023, October 2023, May 2024, October 2024, March 2025, June 2025   Lab Review: 08/02/21: WBC: 3.5, Hb 11.3, Ferritin: 20, Iron  Sat: 7%, TIBC: 553 11/14/21: Ferritin 103, Hb 12.3, Sat: 11% 02/19/2022: Ferritin 21, iron  saturation 7%, TIBC 545, B12 739, hemoglobin 10.6, folate 13.5 06/25/22: Hb 10.9, MCV 82, Iron  Sat  8%, TIBC 487, Ferritin 33 10/17/2022: Hemoglobin 7.4, MCV 68.5, RDW 16, ferritin 6, iron  saturation 3%, TIBC 633 02/05/2023: Hemoglobin 8.6, MCV 71.2, iron  saturation 4%, TIBC 596, ferritin 9 05/08/2023: Hemoglobin 10.9, MCV 81.7, WBC 3.2, iron  saturation 11%, ferritin 75 08/12/2023: Hemoglobin 8.6, MCV 74.3, iron  saturation 3%, TIBC 615, ferritin 8 11/01/2023: Hemoglobin 8.5, MCV 75.6, iron  saturation 4%, ferritin 23 12/05/23: Hemoglobin: 7.3, MCV 83.6, WBC: 1.8, ANC 0.8, Iron  sat: 6%, Ferritin 140   Recommendation: Inspite of improving iron  studies, there is persistent anemia and worsening leukopenia. Recent [facility]TI [facility]. Initially I was thinking that she would need a bone marrow biopsy but because of the recent UTI and antibiotics, we decided to recheck her labs in 2 weeks and then make a decision on evaluating her bone marrow.  Labs in 2 weeks and telephone appointment after that   Assessment & Plan Anemia Hemoglobin 7.3, decreasing despite IV iron . Ferritin 140, indicating adequate iron  stores. Possible decreased production anemia. Recent antibiotics may have contributed to decreased counts. - Recheck labs in two weeks to assess hemoglobin. - Check B12 and folic acid  levels with next blood work.  Leukopenia WBC 1.8, decreased from 2.3. Possible relation to recent antibiotic use. Further evaluation needed if no improvement. - Recheck labs in two weeks to assess WBC.      I discussed the assessment and treatment plan with the patient. The patient was provided an opportunity to ask questions and all were answered. The patient agreed with the plan and demonstrated an understanding of the instructions. The patient was advised to call back or  seek an in-person evaluation if the symptoms worsen or if the condition fails to improve as anticipated.   I provided 20 minutes of non-face-to-face time during this encounter.  This includes time for charting  and coordination of care   Shirley MARLA Chad, MD

## 2023-12-09 NOTE — Progress Notes (Unsigned)
   Follow-Up Visit   Subjective  Shirley Juarez is a 73 y.o. female who presents for the following: Patient here to follow up on treatment for psoriasis.   The following portions of the chart were reviewed this encounter and updated as appropriate: medications, allergies, medical history  Review of Systems:  No other skin or systemic complaints except as noted in HPI or Assessment and Plan.  Objective  Well appearing patient in no apparent distress; mood and affect are within normal limits.    A focused examination was performed of the following areas: scalp  Relevant exam findings are noted in the Assessment and Plan.       Mid Frontal Scalp 2cm crusted tender plaque   Assessment & Plan   PSORIASIS Exam: Well-demarcated erythematous papules/plaques with silvery scale, guttate pink scaly papules. 10% BSA.  Not at goal vs flared   patient denies joint pain  Psoriasis is a chronic non-curable, but treatable genetic/hereditary disease that may have other systemic features affecting other organ systems such as joints (Psoriatic Arthritis). It is associated with an increased risk of inflammatory bowel disease, heart disease, non-alcoholic fatty liver disease, and depression.  Treatments include light and laser treatments; topical medications; and systemic medications including oral and injectables.  Treatment Plan: -patient is currently using clobetasol  solution to scalp for flairs, zink shampoo and Otezla  -  SEBORRHEIC KERATOSIS - Stuck-on, waxy, tan-brown papules and/or plaques  - Benign-appearing - Discussed benign etiology and prognosis. - Observe - Call for any changes     NEOPLASM OF UNCERTAIN BEHAVIOR OF SKIN Mid Frontal Scalp Skin / nail biopsy Type of biopsy: tangential   Informed consent: discussed and consent obtained   Timeout: patient name, date of birth, surgical site, and procedure verified   Procedure prep:  Patient was prepped and draped in  usual sterile fashion Prep type:  Isopropyl alcohol Anesthesia: the lesion was anesthetized in a standard fashion   Anesthetic:  1% lidocaine  w/ epinephrine  1-100,000 buffered w/ 8.4% NaHCO3 Instrument used: DermaBlade   Hemostasis achieved with: aluminum chloride   Outcome: patient tolerated procedure well   Post-procedure details: sterile dressing applied and wound care instructions given   Dressing type: petrolatum gauze and bandage    Specimen 1 - Surgical pathology Differential Diagnosis: r/o scc vs psoriasis vs isk  Check Margins: No  Return in about 3 months (around 03/10/2024) for Follow up for psoriasis .  IBerwyn Lesches, Surg Tech III, am acting as scribe for [employer], DO.   Documentation: I have reviewed the above documentation for accuracy and completeness, and I agree with the above.  Delon Lenis, DO

## 2023-12-09 NOTE — Patient Instructions (Addendum)

## 2023-12-11 LAB — SURGICAL PATHOLOGY

## 2023-12-16 ENCOUNTER — Ambulatory Visit: Payer: Self-pay | Admitting: Dermatology

## 2023-12-30 ENCOUNTER — Inpatient Hospital Stay

## 2023-12-30 ENCOUNTER — Ambulatory Visit: Payer: Self-pay

## 2023-12-30 ENCOUNTER — Other Ambulatory Visit: Payer: Self-pay

## 2023-12-30 DIAGNOSIS — D509 Iron deficiency anemia, unspecified: Secondary | ICD-10-CM

## 2023-12-30 DIAGNOSIS — D703 Neutropenia due to infection: Secondary | ICD-10-CM

## 2023-12-30 LAB — CBC WITH DIFFERENTIAL (CANCER CENTER ONLY)
Abs Immature Granulocytes: 0.01 K/uL (ref 0.00–0.07)
Basophils Absolute: 0 K/uL (ref 0.0–0.1)
Basophils Relative: 1 %
Eosinophils Absolute: 0 K/uL (ref 0.0–0.5)
Eosinophils Relative: 2 %
HCT: 21.1 % — ABNORMAL LOW (ref 36.0–46.0)
Hemoglobin: 6.5 g/dL — CL (ref 12.0–15.0)
Immature Granulocytes: 1 %
Lymphocytes Relative: 28 %
Lymphs Abs: 0.5 K/uL — ABNORMAL LOW (ref 0.7–4.0)
MCH: 23.1 pg — ABNORMAL LOW (ref 26.0–34.0)
MCHC: 30.8 g/dL (ref 30.0–36.0)
MCV: 75.1 fL — ABNORMAL LOW (ref 80.0–100.0)
Monocytes Absolute: 0.2 K/uL (ref 0.1–1.0)
Monocytes Relative: 12 %
Neutro Abs: 1.1 K/uL — ABNORMAL LOW (ref 1.7–7.7)
Neutrophils Relative %: 56 %
Platelet Count: 246 K/uL (ref 150–400)
RBC: 2.81 MIL/uL — ABNORMAL LOW (ref 3.87–5.11)
RDW: 19.2 % — ABNORMAL HIGH (ref 11.5–15.5)
WBC Count: 1.9 K/uL — ABNORMAL LOW (ref 4.0–10.5)
nRBC: 0 % (ref 0.0–0.2)

## 2023-12-30 LAB — RETIC PANEL
Immature Retic Fract: 16.8 % — ABNORMAL HIGH (ref 2.3–15.9)
RBC.: 2.8 MIL/uL — ABNORMAL LOW (ref 3.87–5.11)
Retic Count, Absolute: 76.5 K/uL (ref 19.0–186.0)
Retic Ct Pct: 2.7 % (ref 0.4–3.1)
Reticulocyte Hemoglobin: 15.5 pg — ABNORMAL LOW (ref >27.9)

## 2023-12-30 LAB — CMP (CANCER CENTER ONLY)
ALT: 10 U/L (ref 0–44)
AST: 22 U/L (ref 15–41)
Albumin: 3.9 g/dL (ref 3.5–5.0)
Alkaline Phosphatase: 64 U/L (ref 38–126)
Anion gap: 6 (ref 5–15)
BUN: 12 mg/dL (ref 8–23)
CO2: 26 mmol/L (ref 22–32)
Calcium: 8.7 mg/dL — ABNORMAL LOW (ref 8.9–10.3)
Chloride: 104 mmol/L (ref 98–111)
Creatinine: 0.81 mg/dL (ref 0.44–1.00)
GFR, Estimated: >60 mL/min (ref >60)
Glucose, Bld: 171 mg/dL — ABNORMAL HIGH (ref 70–99)
Potassium: 3.5 mmol/L (ref 3.5–5.1)
Sodium: 136 mmol/L (ref 135–145)
Total Bilirubin: 0.3 mg/dL (ref 0.0–1.2)
Total Protein: 6.6 g/dL (ref 6.5–8.1)

## 2023-12-30 LAB — FOLATE: Folate: 11.9 ng/mL (ref >5.9)

## 2023-12-30 LAB — FERRITIN: Ferritin: 18 ng/mL (ref 11–307)

## 2023-12-30 LAB — LACTATE DEHYDROGENASE: LDH: 160 U/L (ref 98–192)

## 2023-12-30 LAB — VITAMIN B12: Vitamin B-12: 861 pg/mL (ref 180–914)

## 2023-12-30 NOTE — Telephone Encounter (Signed)
-----   Message from Viinay K Gudena sent at 12/30/2023  3:46 PM EDT ----- Leita I think she needs blood transfusion. Can you please talk to her and arrange?   Thank you ----- Message ----- From: Rebecka, Lab In [place] Sent: 12/30/2023  12:04 PM EDT To: Mackey Chad, MD

## 2023-12-30 NOTE — Telephone Encounter (Signed)
 Called pt to discuss lab results and need for blood transfusion. She is agreeable to plan and will come in 12/31/23 at 1130 for lab and 7/30 phone visit with MD then blood transfusion at 10.

## 2023-12-31 ENCOUNTER — Other Ambulatory Visit: Payer: Self-pay | Admitting: *Deleted

## 2023-12-31 ENCOUNTER — Inpatient Hospital Stay

## 2023-12-31 DIAGNOSIS — D509 Iron deficiency anemia, unspecified: Secondary | ICD-10-CM

## 2023-12-31 LAB — SAMPLE TO BLOOD BANK

## 2023-12-31 LAB — PREPARE RBC (CROSSMATCH)

## 2024-01-01 ENCOUNTER — Telehealth: Payer: Self-pay

## 2024-01-01 ENCOUNTER — Ambulatory Visit

## 2024-01-01 ENCOUNTER — Inpatient Hospital Stay: Admitting: Hematology and Oncology

## 2024-01-01 DIAGNOSIS — D72819 Decreased white blood cell count, unspecified: Secondary | ICD-10-CM

## 2024-01-01 DIAGNOSIS — D509 Iron deficiency anemia, unspecified: Secondary | ICD-10-CM | POA: Diagnosis not present

## 2024-01-01 MED ORDER — SODIUM CHLORIDE 0.9% IV SOLUTION
250.0000 mL | INTRAVENOUS | Status: DC
  Administered 2024-01-01: 100 mL via INTRAVENOUS

## 2024-01-01 MED ORDER — DIPHENHYDRAMINE HCL 25 MG PO CAPS
25.0000 mg | ORAL_CAPSULE | Freq: Once | ORAL | Status: AC
  Administered 2024-01-01: 25 mg via ORAL
  Filled 2024-01-01: qty 1

## 2024-01-01 MED ORDER — ACETAMINOPHEN 325 MG PO TABS
650.0000 mg | ORAL_TABLET | Freq: Once | ORAL | Status: AC
  Administered 2024-01-01: 650 mg via ORAL
  Filled 2024-01-01: qty 2

## 2024-01-01 NOTE — Progress Notes (Signed)
 HEMATOLOGY-ONCOLOGY TELEPHONE VISIT PROGRESS NOTE  I connected with our patient on 01/01/24 at  8:30 AM EDT by telephone and verified that I am speaking with the correct person using two identifiers.  I discussed the limitations, risks, security and privacy concerns of performing an evaluation and management service by telephone and the availability of in person appointments.  I also discussed with the patient that there may be a patient responsible charge related to this service. The patient expressed understanding and agreed to proceed.   History of Present Illness: Iron  deficiency anemia and leukopenia  History of Present Illness Shirley Juarez is a 73 year old female who presents with anemia and leukopenia.  She experiences fatigue affecting her daily activities. Her hemoglobin level is 6.5 g/dL, lower than previous measurements. Her white blood cell count is 1.9 x 10^9/L, with neutrophils at 1.1 x 10^9/L. Previous counts were slightly higher at 1.8 x 10^9/L and 0.0 x 10^9/L, respectively. Her ferritin level has decreased to 11.9 ng/mL from a higher level a few weeks ago. She is scheduled to receive IV iron  infusions.      REVIEW OF SYSTEMS:   Constitutional: Denies fevers, chills or abnormal weight loss All other systems were reviewed with the patient and are negative. Observations/Objective:     Assessment Plan:  Iron  deficiency anemia Discovered during her hospitalization in August 2022 (she was hospitalized for severe UTIs) hemoglobin was 6.9 MCV 72 (1 unit of PRBC and IV iron  x1 dose) Upper endoscopy and colonoscopy performed November 2022: No evidence of bleeding   IV Iron : Dec 2022, Mar 2023, October 2023, May 2024, October 2024, March 2025, June 2025   Lab Review: 08/02/21: WBC: 3.5, Hb 11.3, Ferritin: 20, Iron  Sat: 7%, TIBC: 553 11/14/21: Ferritin 103, Hb 12.3, Sat: 11% 02/19/2022: Ferritin 21, iron  saturation 7%, TIBC 545, B12 739, hemoglobin 10.6, folate  13.5 06/25/22: Hb 10.9, MCV 82, Iron  Sat 8%, TIBC 487, Ferritin 33 10/17/2022: Hemoglobin 7.4, MCV 68.5, RDW 16, ferritin 6, iron  saturation 3%, TIBC 633 02/05/2023: Hemoglobin 8.6, MCV 71.2, iron  saturation 4%, TIBC 596, ferritin 9 05/08/2023: Hemoglobin 10.9, MCV 81.7, WBC 3.2, iron  saturation 11%, ferritin 75 08/12/2023: Hemoglobin 8.6, MCV 74.3, iron  saturation 3%, TIBC 615, ferritin 8 11/01/2023: Hemoglobin 8.5, MCV 75.6, iron  saturation 4%, ferritin 23 12/05/23: Hemoglobin: 7.3, MCV 83.6, WBC: 1.8, ANC 0.8, Iron  sat: 6%, Ferritin 140 12/22/2023: Hemoglobin 6.5, MCV 75.1, WBC 1.9, ANC 1.1, platelets 246, reticulocyte hemoglobin 15.5, reticulocyte percent: 2.7%, Cr 0.81, Ferritin 11.9, B 12 861    Recommendation:  Blood Transfusion and IV Iron  In spite of improving iron  studies, there is persistent anemia and worsening leukopenia.  Recheck blood work in 1 month and if the leukopenia still persists, consider Bone marrow biopsy  Follow-up in 1 month with labs and a telephone visit 1 day later Assessment & Plan Iron  deficiency anemia Hemoglobin critically low at 6.5 g/dL, ferritin at 88.0 ng/mL indicating severe anemia and worsening iron  deficiency. UTI not confirmed as exacerbating factor. - Administer blood transfusion at 10:00 AM. - Schedule IV iron  infusion later. - Recheck hemoglobin and ferritin in one month.  Leukopenia White blood cell count at 1.9 x 10^9/L, neutrophils at 1.1 x 10^9/L, minimal improvement. Unrelated to iron  deficiency anemia. Persistent low counts suggest bone marrow production issues. - Consider bone marrow biopsy if no improvement in white blood cell count after one month.      I discussed the assessment and treatment plan with the patient. The  patient was provided an opportunity to ask questions and all were answered. The patient agreed with the plan and demonstrated an understanding of the instructions. The patient was advised to call back or seek an in-person  evaluation if the symptoms worsen or if the condition fails to improve as anticipated.   I provided 20 minutes of non-face-to-face time during this encounter.  This includes time for charting and coordination of care   Naomi MARLA Chad, MD

## 2024-01-01 NOTE — Patient Instructions (Signed)

## 2024-01-01 NOTE — Assessment & Plan Note (Addendum)
 Discovered during her hospitalization in August 2022 (she was hospitalized for severe UTIs) hemoglobin was 6.9 MCV 72 (1 unit of PRBC and IV iron  x1 dose) Upper endoscopy and colonoscopy performed November 2022: No evidence of bleeding   IV Iron : Dec 2022, Mar 2023, October 2023, May 2024, October 2024, March 2025, June 2025   Lab Review: 08/02/21: WBC: 3.5, Hb 11.3, Ferritin: 20, Iron  Sat: 7%, TIBC: 553 11/14/21: Ferritin 103, Hb 12.3, Sat: 11% 02/19/2022: Ferritin 21, iron  saturation 7%, TIBC 545, B12 739, hemoglobin 10.6, folate 13.5 06/25/22: Hb 10.9, MCV 82, Iron  Sat 8%, TIBC 487, Ferritin 33 10/17/2022: Hemoglobin 7.4, MCV 68.5, RDW 16, ferritin 6, iron  saturation 3%, TIBC 633 02/05/2023: Hemoglobin 8.6, MCV 71.2, iron  saturation 4%, TIBC 596, ferritin 9 05/08/2023: Hemoglobin 10.9, MCV 81.7, WBC 3.2, iron  saturation 11%, ferritin 75 08/12/2023: Hemoglobin 8.6, MCV 74.3, iron  saturation 3%, TIBC 615, ferritin 8 11/01/2023: Hemoglobin 8.5, MCV 75.6, iron  saturation 4%, ferritin 23 12/05/23: Hemoglobin: 7.3, MCV 83.6, WBC: 1.8, ANC 0.8, Iron  sat: 6%, Ferritin 140 12/22/2023: Hemoglobin 6.5, MCV 75.1, WBC 1.9, ANC 1.1, platelets 246, reticulocyte hemoglobin 15.5, reticulocyte percent: 2.7%, Cr 0.81, Ferritin 11.9, B 12 861    Recommendation:  Blood Transfusion and IV Iron  In spite of improving iron  studies, there is persistent anemia and worsening leukopenia. Recommend Bone marrow biopsy

## 2024-01-01 NOTE — Telephone Encounter (Signed)
 Dr. Gudena, patient will be scheduled as soon as possible.  Auth Submission: NO AUTH NEEDED Site of care: Site of care: [facility] Payer: [employer] FEP Medication & CPT/J Code(s) submitted: Feraheme (ferumoxytol ) [id number] Diagnosis Code:  Route of submission (phone, fax, portal): phone Phone # [phone] Fax # Auth type: Buy/Bill PB Units/visits requested: 510mg  x 2 doses Reference number: [id number] Approval from: 01/01/24 to 05/03/24

## 2024-01-02 ENCOUNTER — Telehealth: Payer: Self-pay | Admitting: Hematology and Oncology

## 2024-01-02 LAB — BPAM RBC
Blood Product Expiration Date: 202508222359
Blood Product Unit Number: 202508222359
ISSUE DATE / TIME: 202507301053
PRODUCT CODE: 202507301053
PRODUCT CODE: 202508222359
Unit Type and Rh: 9500
Unit Type and Rh: 202508222359
Unit Type and Rh: 9500
Unit Type and Rh: 9500

## 2024-01-02 LAB — TYPE AND SCREEN
ABO/RH(D): O NEG
Antibody Screen: NEGATIVE
Unit division: 0
Unit division: 0

## 2024-01-02 NOTE — Telephone Encounter (Signed)
 Spoke with patient confirming upcoming appointment

## 2024-01-06 ENCOUNTER — Ambulatory Visit

## 2024-01-06 VITALS — BP 159/67 | HR 77 | Temp 97.8°F | Resp 16 | Ht 60.25 in | Wt 144.4 lb

## 2024-01-06 DIAGNOSIS — D509 Iron deficiency anemia, unspecified: Secondary | ICD-10-CM

## 2024-01-06 MED ORDER — SODIUM CHLORIDE 0.9 % IV SOLN
510.0000 mg | Freq: Once | INTRAVENOUS | Status: AC
  Administered 2024-01-06: 510 mg via INTRAVENOUS
  Filled 2024-01-06: qty 17

## 2024-01-06 NOTE — Progress Notes (Signed)
 Diagnosis: Iron  Deficiency Anemia  Provider:  Praveen Mannam MD  Procedure: IV Infusion  IV Type: Peripheral, IV Location: R Antecubital  Feraheme (Ferumoxytol ), Dose: 510 mg  Infusion Start Time: 1147  Infusion Stop Time: 1205  Post Infusion IV Care: Patient declined observation and Peripheral IV Discontinued  Discharge: Condition: Good, Destination: Home . AVS Declined  Performed by:  Maximiano JONELLE Pouch, LPN

## 2024-01-13 ENCOUNTER — Ambulatory Visit

## 2024-01-13 VITALS — BP 121/58 | HR 80 | Temp 97.7°F | Resp 16 | Ht 60.25 in | Wt 142.8 lb

## 2024-01-13 DIAGNOSIS — D509 Iron deficiency anemia, unspecified: Secondary | ICD-10-CM | POA: Diagnosis not present

## 2024-01-13 MED ORDER — SODIUM CHLORIDE 0.9 % IV SOLN
510.0000 mg | Freq: Once | INTRAVENOUS | Status: AC
  Administered 2024-01-13 (×2): 510 mg via INTRAVENOUS
  Filled 2024-01-13: qty 17

## 2024-01-13 NOTE — Progress Notes (Signed)
 Diagnosis: Iron  Deficiency Anemia  Provider:  Praveen Mannam MD  Procedure: IV Infusion  IV Type: Peripheral, IV Location: L Antecubital  Feraheme (Ferumoxytol ), Dose: 510 mg  Infusion Start Time: 1152  Infusion Stop Time: 1208  Post Infusion IV Care: Patient declined observation and Peripheral IV Discontinued  Discharge: Condition: Good, Destination: Home . AVS Declined  Performed by:  Maximiano JONELLE Pouch, LPN

## 2024-02-05 ENCOUNTER — Inpatient Hospital Stay: Attending: Hematology and Oncology

## 2024-02-05 DIAGNOSIS — D509 Iron deficiency anemia, unspecified: Secondary | ICD-10-CM | POA: Diagnosis present

## 2024-02-05 DIAGNOSIS — D72819 Decreased white blood cell count, unspecified: Secondary | ICD-10-CM | POA: Insufficient documentation

## 2024-02-05 LAB — IRON AND IRON BINDING CAPACITY (CC-WL,HP ONLY)
Iron: 21 ug/dL — ABNORMAL LOW (ref 28–170)
Saturation Ratios: 5 % — ABNORMAL LOW (ref 10.4–31.8)
TIBC: 456 ug/dL — ABNORMAL HIGH (ref 250–450)
UIBC: 435 ug/dL (ref 148–442)

## 2024-02-05 LAB — CBC WITH DIFFERENTIAL (CANCER CENTER ONLY)
Abs Immature Granulocytes: 0.02 K/uL (ref 0.00–0.07)
Basophils Absolute: 0 K/uL (ref 0.0–0.1)
Basophils Relative: 1 %
Eosinophils Absolute: 0.1 K/uL (ref 0.0–0.5)
Eosinophils Relative: 3 %
HCT: 23.5 % — ABNORMAL LOW (ref 36.0–46.0)
Hemoglobin: 7.2 g/dL — ABNORMAL LOW (ref 12.0–15.0)
Immature Granulocytes: 1 %
Lymphocytes Relative: 29 %
Lymphs Abs: 0.6 K/uL — ABNORMAL LOW (ref 0.7–4.0)
MCH: 26.3 pg (ref 26.0–34.0)
MCHC: 30.6 g/dL (ref 30.0–36.0)
MCV: 85.8 fL (ref 80.0–100.0)
Monocytes Absolute: 0.2 K/uL (ref 0.1–1.0)
Monocytes Relative: 10 %
Neutro Abs: 1.1 K/uL — ABNORMAL LOW (ref 1.7–7.7)
Neutrophils Relative %: 56 %
Platelet Count: 256 K/uL (ref 150–400)
RBC: 2.74 MIL/uL — ABNORMAL LOW (ref 3.87–5.11)
RDW: 20 % — ABNORMAL HIGH (ref 11.5–15.5)
WBC Count: 1.9 K/uL — ABNORMAL LOW (ref 4.0–10.5)
nRBC: 0 % (ref 0.0–0.2)

## 2024-02-05 LAB — FERRITIN: Ferritin: 69 ng/mL (ref 11–307)

## 2024-02-05 NOTE — Assessment & Plan Note (Signed)
 Discovered during her hospitalization in August 2022 (she was hospitalized for severe UTIs) hemoglobin was 6.9 MCV 72 (1 unit of PRBC and IV iron  x1 dose) Upper endoscopy and colonoscopy performed November 2022: No evidence of bleeding   IV Iron : Dec 2022, Mar 2023, October 2023, May 2024, October 2024, March 2025, June 2025, August 2025   Lab Review: 08/02/21: WBC: 3.5, Hb 11.3, Ferritin: 20, Iron  Sat: 7%, TIBC: 553 11/14/21: Ferritin 103, Hb 12.3, Sat: 11% 02/19/2022: Ferritin 21, iron  saturation 7%, TIBC 545, B12 739, hemoglobin 10.6, folate 13.5 06/25/22: Hb 10.9, MCV 82, Iron  Sat 8%, TIBC 487, Ferritin 33 10/17/2022: Hemoglobin 7.4, MCV 68.5, RDW 16, ferritin 6, iron  saturation 3%, TIBC 633 02/05/2023: Hemoglobin 8.6, MCV 71.2, iron  saturation 4%, TIBC 596, ferritin 9 05/08/2023: Hemoglobin 10.9, MCV 81.7, WBC 3.2, iron  saturation 11%, ferritin 75 08/12/2023: Hemoglobin 8.6, MCV 74.3, iron  saturation 3%, TIBC 615, ferritin 8 11/01/2023: Hemoglobin 8.5, MCV 75.6, iron  saturation 4%, ferritin 23 12/05/23: Hemoglobin: 7.3, MCV 83.6, WBC: 1.8, ANC 0.8, Iron  sat: 6%, Ferritin 140 12/22/2023: Hemoglobin 6.5, MCV 75.1, WBC 1.9, ANC 1.1, platelets 246, reticulocyte hemoglobin 15.5, reticulocyte percent: 2.7%, Cr 0.81, Ferritin 11.9, B 12 861 02/05/2024: Iron  saturation 5%, TIBC 456, hemoglobin 7.2, ferritin pending, ANC 1.1, WBC 1.9     Recommendation:   IV Iron  Bone marrow biopsy   Follow-up in 1 month with labs and a telephone visit 1 day later

## 2024-02-06 ENCOUNTER — Inpatient Hospital Stay

## 2024-02-06 ENCOUNTER — Inpatient Hospital Stay (HOSPITAL_BASED_OUTPATIENT_CLINIC_OR_DEPARTMENT_OTHER): Admitting: Hematology and Oncology

## 2024-02-06 ENCOUNTER — Other Ambulatory Visit: Payer: Self-pay | Admitting: *Deleted

## 2024-02-06 DIAGNOSIS — D509 Iron deficiency anemia, unspecified: Secondary | ICD-10-CM

## 2024-02-06 DIAGNOSIS — D72819 Decreased white blood cell count, unspecified: Secondary | ICD-10-CM

## 2024-02-06 LAB — PREPARE RBC (CROSSMATCH)

## 2024-02-06 LAB — SAMPLE TO BLOOD BANK

## 2024-02-06 NOTE — Progress Notes (Signed)
 Pt hgb 7.2.  Verbal orders received and placed per MD for pt to receive 2 units of PRBCs.  Appt scheduled, pt notified and verbalized understanding.

## 2024-02-06 NOTE — Progress Notes (Signed)
 HEMATOLOGY-ONCOLOGY TELEPHONE VISIT PROGRESS NOTE  I connected with our patient on 02/06/24 at 11:15 AM EDT by telephone and verified that I am speaking with the correct person using two identifiers.  I discussed the limitations, risks, security and privacy concerns of performing an evaluation and management service by telephone and the availability of in person appointments.  I also discussed with the patient that there may be a patient responsible charge related to this service. The patient expressed understanding and agreed to proceed.   History of Present Illness: Follow-up of anemia and low white blood cell count  History of Present Illness Shirley Juarez is a 73 year old female who presents with fatigue and low hemoglobin levels.  She experiences persistent fatigue with a hemoglobin level of 7.2 g/dL. Her white blood cell count is 1.9 x 10^9/L.  Iron  studies show a ferritin level of 69 ng/mL and an iron  saturation of 5%.  She has received blood transfusions in the past for low hemoglobin. Approximately three weeks ago, she noticed blood in her stools, leading to a colonoscopy.     REVIEW OF SYSTEMS:   Constitutional: Denies fevers, chills or abnormal weight loss All other systems were reviewed with the patient and are negative. Observations/Objective:     Assessment Plan:  Iron  deficiency anemia Discovered during her hospitalization in August 2022 (she was hospitalized for severe UTIs) hemoglobin was 6.9 MCV 72 (1 unit of PRBC and IV iron  x1 dose) Upper endoscopy and colonoscopy performed November 2022: No evidence of bleeding   IV Iron : Dec 2022, Mar 2023, October 2023, May 2024, October 2024, March 2025, June 2025, August 2025   Lab Review: 08/02/21: WBC: 3.5, Hb 11.3, Ferritin: 20, Iron  Sat: 7%, TIBC: 553 11/14/21: Ferritin 103, Hb 12.3, Sat: 11% 02/19/2022: Ferritin 21, iron  saturation 7%, TIBC 545, B12 739, hemoglobin 10.6, folate 13.5 06/25/22: Hb 10.9, MCV 82,  Iron  Sat 8%, TIBC 487, Ferritin 33 10/17/2022: Hemoglobin 7.4, MCV 68.5, RDW 16, ferritin 6, iron  saturation 3%, TIBC 633 02/05/2023: Hemoglobin 8.6, MCV 71.2, iron  saturation 4%, TIBC 596, ferritin 9 05/08/2023: Hemoglobin 10.9, MCV 81.7, WBC 3.2, iron  saturation 11%, ferritin 75 08/12/2023: Hemoglobin 8.6, MCV 74.3, iron  saturation 3%, TIBC 615, ferritin 8 11/01/2023: Hemoglobin 8.5, MCV 75.6, iron  saturation 4%, ferritin 23 12/05/23: Hemoglobin: 7.3, MCV 83.6, WBC: 1.8, ANC 0.8, Iron  sat: 6%, Ferritin 140 12/22/2023: Hemoglobin 6.5, MCV 75.1, WBC 1.9, ANC 1.1, platelets 246, reticulocyte hemoglobin 15.5, reticulocyte percent: 2.7%, Cr 0.81, Ferritin 11.9, B 12 861 02/05/2024: Iron  saturation 5%, TIBC 456, hemoglobin 7.2, ferritin pending, ANC 1.1, WBC 1.9     Recommendation:  Blood transfusion Bone marrow biopsy   Follow-up with me 1 week after the bone marrow biopsy.   Assessment & Plan Iron  deficiency anemia with persistent low hemoglobin Persistent low hemoglobin at 7.2 g/dL with normal ferritin and low iron  saturation suggests a production issue. - Arrange blood transfusion after type and cross matching.  Leukopenia White blood cell count at 1.9 x 10^9/L with unclear etiology not explained by iron  deficiency or bleeding. - Request radiology to schedule bone marrow biopsy.      I discussed the assessment and treatment plan with the patient. The patient was provided an opportunity to ask questions and all were answered. The patient agreed with the plan and demonstrated an understanding of the instructions. The patient was advised to call back or seek an in-person evaluation if the symptoms worsen or if the condition fails to improve as  anticipated.   I provided 20 minutes of non-face-to-face time during this encounter.  This includes time for charting and coordination of care   Naomi MARLA Chad, MD

## 2024-02-07 ENCOUNTER — Encounter: Payer: Self-pay | Admitting: Hematology and Oncology

## 2024-02-07 ENCOUNTER — Encounter: Payer: Self-pay | Admitting: *Deleted

## 2024-02-07 NOTE — Progress Notes (Signed)
 Pt scheduled for BMBX with CT.  RN sent message to scheduling team to schedule lab and MD f/u 1 week post.

## 2024-02-08 ENCOUNTER — Inpatient Hospital Stay

## 2024-02-08 DIAGNOSIS — D509 Iron deficiency anemia, unspecified: Secondary | ICD-10-CM | POA: Diagnosis not present

## 2024-02-08 MED ORDER — ACETAMINOPHEN 325 MG PO TABS
650.0000 mg | ORAL_TABLET | Freq: Once | ORAL | Status: AC
  Administered 2024-02-08: 650 mg via ORAL
  Filled 2024-02-08: qty 2

## 2024-02-08 MED ORDER — DIPHENHYDRAMINE HCL 25 MG PO CAPS
25.0000 mg | ORAL_CAPSULE | Freq: Once | ORAL | Status: AC
  Administered 2024-02-08: 25 mg via ORAL
  Filled 2024-02-08: qty 1

## 2024-02-08 MED ORDER — SODIUM CHLORIDE 0.9% IV SOLUTION
250.0000 mL | INTRAVENOUS | Status: DC
  Administered 2024-02-08: 250 mL via INTRAVENOUS

## 2024-02-08 NOTE — Patient Instructions (Signed)

## 2024-02-10 LAB — TYPE AND SCREEN
ABO/RH(D): O NEG
Antibody Screen: NEGATIVE
Unit division: 0
Unit division: 0

## 2024-02-10 LAB — BPAM RBC
Blood Product Expiration Date: 202509212359
Blood Product Expiration Date: 202509232359
ISSUE DATE / TIME: 202509060946
ISSUE DATE / TIME: 202509060946
Unit Type and Rh: 9500
Unit Type and Rh: 9500

## 2024-02-28 ENCOUNTER — Other Ambulatory Visit: Payer: Self-pay | Admitting: Radiology

## 2024-02-28 DIAGNOSIS — D509 Iron deficiency anemia, unspecified: Secondary | ICD-10-CM

## 2024-02-28 NOTE — H&P (Signed)
 Chief Complaint: Unexplained anemia/leukopenia; referred for image guided bone marrow biopsy for further evaluation  Referring Provider(s): Gudena,V  Supervising Physician: Jenna Hacker  Patient Status: [facility] - Out-pt  History of Present Illness: Shirley Juarez is a 73 y.o. female with past medical history significant for anxiety, arthritis, coronary artery disease, depression, diabetes, GERD, heart murmur, hyperlipidemia, hypertension, sleep apnea who presents now with iron  deficiency anemia with persistently low hemoglobin with normal ferritin and low iron  saturation along with leukopenia of uncertain etiology.  She is scheduled today for image guided bone marrow biopsy for further evaluation.   Patient is Full Code  Past Medical History:  Diagnosis Date   Anxiety    Arthritis    Osteoarthritis- knee and back   Coronary artery disease    Cath 2004 in [place], [place] with minor CAD   Depression    Diabetes mellitus    On oral and diet   GERD (gastroesophageal reflux disease)    Heart murmur    Hyperlipidemia    Hypertension    Sleep apnea    does not use CPAP- it is broken.  Last sleep study was 7 years ago.  Has not worn in  4  years    Past Surgical History:  Procedure Laterality Date   ABDOMINAL HYSTERECTOMY     COLONOSCOPY     Dr.Mann   KNEE ARTHROCENTESIS     TOTAL KNEE ARTHROPLASTY  06/22/2011   Bilateral     Allergies: Latex, Codeine, Covid-19 (mrna bivalent) vaccine (pfizer) [covid-19 (mrna) vaccine], Crestor [rosuvastatin calcium ], Ezetimibe , Fish oil, Hydralazine , Lipitor [atorvastatin calcium ], Peanut-containing drug products, Statins, Welchol [colesevelam hcl], Zithromax [azithromycin], and Other  Medications: Prior to Admission medications   Medication Sig Start Date End Date Taking? Authorizing Provider  acetaminophen  (TYLENOL ) 325 MG tablet Take 325-650 mg by mouth every 6 (six) hours as needed for mild pain or headache.    [provider]   ALPRAZolam  (XANAX ) 0.5 MG tablet Take 0.5 mg by mouth in the morning and at bedtime.    [provider]  amoxicillin  (AMOXIL ) 500 MG capsule amoxicillin  500 mg capsule  TAKE 4 CAPSULES BY MOUTH 1 HOUR PRIOR TO DENTAL APPOINTMENT THEN 2 CAPSULES BY MOUTH AFTER APPOINTMENT    [provider]  Apremilast  (OTEZLA ) 30 MG TABS Take 1 tablet (30 mg total) by mouth 2 (two) times daily. 12/09/23   Alm Delon SAILOR, DO  aspirin  EC 81 MG tablet Take 81 mg by mouth at bedtime.      [provider]  Blood Glucose Monitoring Suppl (ONETOUCH VERIO) w/Device KIT use to test your blood sugar    [provider]  Calcium  Citrate-Vitamin D  (CALCIUM  CITRATE + D3 PO) Take 1 tablet by mouth every Monday, Wednesday, and Friday.    [provider]  cetirizine (ZYRTEC) 5 MG tablet Take 5 mg by mouth daily. 12/28/21   [provider]  clobetasol  (TEMOVATE ) 0.05 % external solution Apply once or twice daily to affected areas on scalp as needed for scaling/itching. Avoid applying to face, groin, and axilla. 12/09/23   Alm Delon SAILOR, DO  docusate sodium (COLACE) 100 MG capsule Take 100 mg by mouth daily.    [provider]  Evolocumab  (REPATHA  SURECLICK) 140 MG/ML SOAJ Inject 140 mg into the skin every 14 (fourteen) days. 04/29/23   Verlin Lonni JONETTA, MD  fluticasone-salmeterol (ADVAIR) 250-50 MCG/ACT AEPB Inhale into the lungs. 09/03/23 01/01/24  [provider]  gemfibrozil  (LOPID ) 600 MG tablet  Take by mouth. 07/10/23   [provider]  glucose blood (ONETOUCH VERIO) test strip use to test your blood sugar    [provider]  hydrALAZINE  (APRESOLINE ) 100 MG tablet TAKE 1 TABLET BY MOUTH 3 TIMES A DAY 12/03/23   Verlin Lonni BIRCH, MD  hydrOXYzine  (ATARAX /VISTARIL ) 25 MG tablet Take 25 mg by mouth at bedtime.      [provider]  ketoconazole (NIZORAL) 2 % shampoo Apply 1 Application topically 2 (two) times a week. 01/16/23    [provider]  lansoprazole  (PREVACID ) 30 MG capsule TAKE 1 CAPSULE BY MOUTH TWICE A DAY BEFORE MEALS 10/03/22   Mansouraty, Aloha Raddle., MD  metFORMIN  (GLUCOPHAGE ) 500 MG tablet Take 1,000 mg by mouth daily with breakfast.    [provider]  ondansetron  (ZOFRAN ) 4 MG tablet Take 1 tablet (4 mg total) by mouth as directed. Take one Zofran  pill 30-60 minutes before each colonoscopy prep dose 03/15/21   Mansouraty, Aloha Raddle., MD  tamsulosin  (FLOMAX ) 0.4 MG CAPS capsule Take 1 capsule (0.4 mg total) by mouth daily after supper. 02/01/21   Drusilla Sabas RAMAN, MD  venlafaxine  XR (EFFEXOR -XR) 150 MG 24 hr capsule Take 150 mg by mouth at bedtime.    [provider]     Family History  Problem Relation Age of Onset   Heart attack Mother 67   Liver disease Father    Heart attack Son        Age 68, he is our patient   Anesthesia problems Neg Hx    Colon cancer Neg Hx    Esophageal cancer Neg Hx    Stomach cancer Neg Hx     Social History   Socioeconomic History   Marital status: Married    Spouse name: Not on file   Number of children: 2   Years of education: Not on file   Highest education level: Not on file  Occupational History   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  Tobacco Use   Smoking status: Never   Smokeless tobacco: Never  Vaping Use   Vaping status: Never Used  Substance and Sexual Activity   Alcohol use: No   Drug use: Not Currently   Sexual activity: Yes    Birth control/protection: Surgical  Other Topics Concern   Not on file  Social History Narrative   Not on file   Social Drivers of Health   Financial Resource Strain: Not on file  Food Insecurity: Low Risk  (11/13/2023)   Received from [facility]   Hunger Vital Sign    Within the past 12 months, you worried that your food would run out before you got money to buy more: Never true    Within the past 12 months, the food you bought just didn't last and you didn't have money to get more. :  Never true  Recent Concern: Food Insecurity - High Risk (11/06/2023)   Received from [facility]   Hunger Vital Sign    Within the past 12 months, you worried that your food would run out before you got money to buy more: Often true    Within the past 12 months, the food you bought just didn't last and you didn't have money to get more. : Sometimes true  Transportation Needs: No Transportation Needs (11/13/2023)   Received from [facility]    In the past 12 months, has lack of reliable transportation kept you from medical appointments, meetings, work or from  getting things needed for daily living? : No  Physical Activity: Not on file  Stress: Not on file  Social Connections: Not on file       Review of Systems: denies fever,HA,CP,dyspnea, cough, abd/back pain,N/V or bleeding  Vital Signs: temp 97.9, BP 152/63  HR 88  R 18  O2 SATS 97% RA    Advance Care Plan: no documents on file    Physical Exam: awake/alert; chest- CTA bilat; heart- RRR,+murmur; abd-soft,+BS,NT; no LE edema  Imaging: No results found.  Labs:  CBC: Recent Labs    11/01/23 1259 12/05/23 1139 12/30/23 1148 02/05/24 1144  WBC 2.3* 1.8* 1.9* 1.9*  HGB 8.5* 7.3* 6.5* 7.2*  HCT 26.3* 23.5* 21.1* 23.5*  PLT 327 216 246 256    COAGS: No results for input(s): INR, APTT in the last 8760 hours.  BMP: Recent Labs    12/30/23 1148  NA 136  K 3.5  CL 104  CO2 26  GLUCOSE 171*  BUN 12  CALCIUM  8.7*  CREATININE 0.81  GFRNONAA >60    LIVER FUNCTION TESTS: Recent Labs    12/30/23 1148  BILITOT 0.3  AST 22  ALT 10  ALKPHOS 64  PROT 6.6  ALBUMIN 3.9    TUMOR MARKERS: No results for input(s): AFPTM, CEA, CA199, CHROMGRNA in the last 8760 hours.  Assessment and Plan: 73 y.o. female with past medical history significant for anxiety, arthritis, coronary artery disease, depression, diabetes, GERD, heart murmur, hyperlipidemia, hypertension, sleep apnea who  presents now with iron  deficiency anemia with persistently low hemoglobin with normal ferritin and low iron  saturation along with leukopenia of uncertain etiology.  She is scheduled today for image guided bone marrow biopsy for further evaluation.Risks and benefits of procedure was discussed with the patient including, but not limited to bleeding, infection, damage to adjacent structures or low yield requiring additional tests.  All of the questions were answered and there is agreement to proceed.  Consent signed and in chart.    Thank you for allowing our service to participate in Shirley Juarez 's care.  Electronically Signed: D. Franky Rakers, PA-C   02/28/2024, 4:11 PM      I spent a total of  20 minutes   in face to face in clinical consultation, greater than 50% of which was counseling/coordinating care for image guided bone marrow biopsy

## 2024-03-02 ENCOUNTER — Ambulatory Visit (HOSPITAL_COMMUNITY)
Admission: RE | Admit: 2024-03-02 | Discharge: 2024-03-02 | Disposition: A | Discharge status: Home / Self Care | Source: Ambulatory Visit | Attending: Hematology and Oncology | Admitting: Hematology and Oncology

## 2024-03-02 ENCOUNTER — Other Ambulatory Visit: Payer: Self-pay

## 2024-03-02 DIAGNOSIS — Z5941 Food insecurity: Secondary | ICD-10-CM | POA: Diagnosis not present

## 2024-03-02 DIAGNOSIS — D72819 Decreased white blood cell count, unspecified: Secondary | ICD-10-CM | POA: Diagnosis not present

## 2024-03-02 DIAGNOSIS — D509 Iron deficiency anemia, unspecified: Secondary | ICD-10-CM

## 2024-03-02 HISTORY — PX: IR BONE MARROW BIOPSY & ASPIRATION: IMG5727

## 2024-03-02 LAB — CBC WITH DIFFERENTIAL/PLATELET
Abs Immature Granulocytes: 0.02 K/uL (ref 0.00–0.07)
Basophils Absolute: 0 K/uL (ref 0.0–0.1)
Basophils Relative: 1 %
Eosinophils Absolute: 0.1 K/uL (ref 0.0–0.5)
Eosinophils Relative: 2 %
HCT: 25.9 % — ABNORMAL LOW (ref 36.0–46.0)
Hemoglobin: 7.6 g/dL — ABNORMAL LOW (ref 12.0–15.0)
Immature Granulocytes: 1 %
Lymphocytes Relative: 24 %
Lymphs Abs: 0.6 K/uL — ABNORMAL LOW (ref 0.7–4.0)
MCH: 24 pg — ABNORMAL LOW (ref 26.0–34.0)
MCHC: 29.3 g/dL — ABNORMAL LOW (ref 30.0–36.0)
MCV: 81.7 fL (ref 80.0–100.0)
Monocytes Absolute: 0.3 K/uL (ref 0.1–1.0)
Monocytes Relative: 11 %
Neutro Abs: 1.6 K/uL — ABNORMAL LOW (ref 1.7–7.7)
Neutrophils Relative %: 61 %
Platelets: 350 K/uL (ref 150–400)
RBC: 3.17 MIL/uL — ABNORMAL LOW (ref 3.87–5.11)
RDW: 17.7 % — ABNORMAL HIGH (ref 11.5–15.5)
WBC: 2.7 K/uL — ABNORMAL LOW (ref 4.0–10.5)
nRBC: 0 % (ref 0.0–0.2)

## 2024-03-02 LAB — GLUCOSE, CAPILLARY: Glucose-Capillary: 136 mg/dL — ABNORMAL HIGH (ref 70–99)

## 2024-03-02 MED ORDER — FENTANYL CITRATE (PF) 100 MCG/2ML IJ SOLN
INTRAMUSCULAR | Status: AC | PRN
  Administered 2024-03-02: 50 ug via INTRAVENOUS

## 2024-03-02 MED ORDER — MIDAZOLAM HCL 2 MG/2ML IJ SOLN
INTRAMUSCULAR | Status: AC
  Filled 2024-03-02: qty 2

## 2024-03-02 MED ORDER — LIDOCAINE HCL (PF) 1 % IJ SOLN: 30.0000 mL | Freq: Once | INTRAMUSCULAR | Status: DC

## 2024-03-02 MED ORDER — MIDAZOLAM HCL 2 MG/2ML IJ SOLN
INTRAMUSCULAR | Status: AC | PRN
  Administered 2024-03-02: 1 mg via INTRAVENOUS

## 2024-03-02 MED ORDER — DIPHENHYDRAMINE HCL 50 MG/ML IJ SOLN
INTRAMUSCULAR | Status: AC | PRN
  Administered 2024-03-02: 25 mg via INTRAVENOUS

## 2024-03-02 MED ORDER — LIDOCAINE HCL (PF) 1 % IJ SOLN
INTRAMUSCULAR | Status: AC
  Filled 2024-03-02: qty 30

## 2024-03-02 MED ORDER — SODIUM CHLORIDE 0.9 % IV SOLN: INTRAVENOUS | Status: DC

## 2024-03-02 MED ORDER — DIPHENHYDRAMINE HCL 50 MG/ML IJ SOLN
INTRAMUSCULAR | Status: AC
  Filled 2024-03-02: qty 1

## 2024-03-02 MED ORDER — FENTANYL CITRATE (PF) 100 MCG/2ML IJ SOLN
INTRAMUSCULAR | Status: AC
  Filled 2024-03-02: qty 2

## 2024-03-02 NOTE — Sedation Documentation (Signed)
 RN Kassi Esteve pulled 50 mg Benadryl , 2 mg Versed  and 100 mcg Fentanyl  in Ir room pysix. Pt. Received 25 mg Benadryl , 2 mg Versed  and 100 mcg Fentanyl  throughout the procedure.

## 2024-03-02 NOTE — Discharge Instructions (Signed)

## 2024-03-02 NOTE — Procedures (Signed)
 Interventional Radiology Procedure Note  Procedure: fluor Guided Biopsy of bone marrow  Complications: None  Estimated Blood Loss: < 10 mL  Findings: 13 G core biopsy of left iliac bone performed under fluoro guidance.  Aspirate and core samples obtained and sent to Pathology.  Cordella DELENA Banner, MD

## 2024-03-02 NOTE — Progress Notes (Signed)
 1300 Refused to take a ice pack to use for comfort to apply to her low back as needed.

## 2024-03-03 LAB — SURGICAL PATHOLOGY

## 2024-03-04 LAB — SURGICAL PATHOLOGY

## 2024-03-10 ENCOUNTER — Encounter (HOSPITAL_COMMUNITY): Payer: Self-pay | Admitting: Hematology and Oncology

## 2024-03-10 ENCOUNTER — Other Ambulatory Visit: Payer: Self-pay | Admitting: *Deleted

## 2024-03-10 DIAGNOSIS — D509 Iron deficiency anemia, unspecified: Secondary | ICD-10-CM

## 2024-03-11 ENCOUNTER — Other Ambulatory Visit: Payer: Self-pay | Admitting: *Deleted

## 2024-03-11 ENCOUNTER — Inpatient Hospital Stay (HOSPITAL_BASED_OUTPATIENT_CLINIC_OR_DEPARTMENT_OTHER): Admitting: Hematology and Oncology

## 2024-03-11 ENCOUNTER — Inpatient Hospital Stay: Attending: Hematology and Oncology

## 2024-03-11 ENCOUNTER — Inpatient Hospital Stay

## 2024-03-11 ENCOUNTER — Telehealth: Payer: Self-pay

## 2024-03-11 VITALS — BP 141/40 | HR 90 | Temp 97.5°F | Resp 18 | Ht 60.25 in | Wt 149.1 lb

## 2024-03-11 DIAGNOSIS — K921 Melena: Secondary | ICD-10-CM | POA: Insufficient documentation

## 2024-03-11 DIAGNOSIS — D509 Iron deficiency anemia, unspecified: Secondary | ICD-10-CM

## 2024-03-11 DIAGNOSIS — I517 Cardiomegaly: Secondary | ICD-10-CM | POA: Insufficient documentation

## 2024-03-11 DIAGNOSIS — R140 Abdominal distension (gaseous): Secondary | ICD-10-CM

## 2024-03-11 DIAGNOSIS — D72819 Decreased white blood cell count, unspecified: Secondary | ICD-10-CM | POA: Insufficient documentation

## 2024-03-11 DIAGNOSIS — K2970 Gastritis, unspecified, without bleeding: Secondary | ICD-10-CM | POA: Insufficient documentation

## 2024-03-11 DIAGNOSIS — R1084 Generalized abdominal pain: Secondary | ICD-10-CM

## 2024-03-11 LAB — CMP (CANCER CENTER ONLY)
ALT: 12 U/L (ref 0–44)
AST: 25 U/L (ref 15–41)
Albumin: 4.2 g/dL (ref 3.5–5.0)
Alkaline Phosphatase: 69 U/L (ref 38–126)
Anion gap: 6 (ref 5–15)
BUN: 13 mg/dL (ref 8–23)
CO2: 27 mmol/L (ref 22–32)
Calcium: 9.2 mg/dL (ref 8.9–10.3)
Chloride: 102 mmol/L (ref 98–111)
Creatinine: 0.69 mg/dL (ref 0.44–1.00)
GFR, Estimated: >60 mL/min (ref >60)
Glucose, Bld: 165 mg/dL — ABNORMAL HIGH (ref 70–99)
Potassium: 4.4 mmol/L (ref 3.5–5.1)
Sodium: 135 mmol/L (ref 135–145)
Total Bilirubin: 0.3 mg/dL (ref 0.0–1.2)
Total Protein: 6.9 g/dL (ref 6.5–8.1)

## 2024-03-11 LAB — CBC WITH DIFFERENTIAL (CANCER CENTER ONLY)
Abs Immature Granulocytes: 0.03 K/uL (ref 0.00–0.07)
Basophils Absolute: 0 K/uL (ref 0.0–0.1)
Basophils Relative: 1 %
Eosinophils Absolute: 0.1 K/uL (ref 0.0–0.5)
Eosinophils Relative: 2 %
HCT: 21 % — ABNORMAL LOW (ref 36.0–46.0)
Hemoglobin: 6.6 g/dL — CL (ref 12.0–15.0)
Immature Granulocytes: 1 %
Lymphocytes Relative: 20 %
Lymphs Abs: 0.5 K/uL — ABNORMAL LOW (ref 0.7–4.0)
MCH: 23.5 pg — ABNORMAL LOW (ref 26.0–34.0)
MCHC: 31.4 g/dL (ref 30.0–36.0)
MCV: 74.7 fL — ABNORMAL LOW (ref 80.0–100.0)
Monocytes Absolute: 0.3 K/uL (ref 0.1–1.0)
Monocytes Relative: 11 %
Neutro Abs: 1.7 K/uL (ref 1.7–7.7)
Neutrophils Relative %: 65 %
Platelet Count: 293 K/uL (ref 150–400)
RBC: 2.81 MIL/uL — ABNORMAL LOW (ref 3.87–5.11)
RDW: 18.6 % — ABNORMAL HIGH (ref 11.5–15.5)
WBC Count: 2.5 K/uL — ABNORMAL LOW (ref 4.0–10.5)
nRBC: 0 % (ref 0.0–0.2)

## 2024-03-11 LAB — PREPARE RBC (CROSSMATCH)

## 2024-03-11 LAB — SAMPLE TO BLOOD BANK

## 2024-03-11 NOTE — Assessment & Plan Note (Signed)
 Discovered during her hospitalization in August 2022 (she was hospitalized for severe UTIs) hemoglobin was 6.9 MCV 72 (1 unit of PRBC and IV iron  x1 dose) Upper endoscopy and colonoscopy performed November 2022: No evidence of bleeding   IV Iron : Dec 2022, Mar 2023, October 2023, May 2024, October 2024, March 2025, June 2025, August 2025   Lab Review: 08/02/21: WBC: 3.5, Hb 11.3, Ferritin: 20, Iron  Sat: 7%, TIBC: 553 11/14/21: Ferritin 103, Hb 12.3, Sat: 11% 02/19/2022: Ferritin 21, iron  saturation 7%, TIBC 545, B12 739, hemoglobin 10.6, folate 13.5 06/25/22: Hb 10.9, MCV 82, Iron  Sat 8%, TIBC 487, Ferritin 33 10/17/2022: Hemoglobin 7.4, MCV 68.5, RDW 16, ferritin 6, iron  saturation 3%, TIBC 633 02/05/2023: Hemoglobin 8.6, MCV 71.2, iron  saturation 4%, TIBC 596, ferritin 9 05/08/2023: Hemoglobin 10.9, MCV 81.7, WBC 3.2, iron  saturation 11%, ferritin 75 08/12/2023: Hemoglobin 8.6, MCV 74.3, iron  saturation 3%, TIBC 615, ferritin 8 11/01/2023: Hemoglobin 8.5, MCV 75.6, iron  saturation 4%, ferritin 23 12/05/23: Hemoglobin: 7.3, MCV 83.6, WBC: 1.8, ANC 0.8, Iron  sat: 6%, Ferritin 140 12/22/2023: Hemoglobin 6.5, MCV 75.1, WBC 1.9, ANC 1.1, platelets 246, reticulocyte hemoglobin 15.5, reticulocyte percent: 2.7%, Cr 0.81, Ferritin 11.9, B 12 861 02/05/2024: Iron  saturation 5%, TIBC 456, hemoglobin 7.2, ferritin pending, ANC 1.1, WBC 1.9  03/02/2024: Bone marrow biopsy: Cellular bone marrow with trilineage hematopoiesis, cytogenetics 46XX normal

## 2024-03-11 NOTE — Progress Notes (Signed)
 Pt hgb 6.6.  Per MD pt needing to receive 2 units PRBC's.  Orders placed, infusion appt scheduled for today.

## 2024-03-11 NOTE — Telephone Encounter (Signed)
 Dr. Gudena, patient will be scheduled as soon as possible.  Auth Submission: NO AUTH NEEDED Site of care: Site of care: [facility] Payer: BCBS FEP Medication & CPT/J Code(s) submitted: Feraheme (ferumoxytol ) [id number] Diagnosis Code:  Route of submission (phone, fax, portal):  Phone # Fax # Auth type: Buy/Bill PB Units/visits requested: 510mg  x 2 doses Reference number:  Approval from: 03/11/24 to 06/03/24

## 2024-03-11 NOTE — Progress Notes (Signed)
 Patient Care Team: James, Natalie W, PA-C as PCP - General (Family Medicine) Verlin Lonni BIRCH, MD as PCP - Cardiology (Cardiology)  DIAGNOSIS:  Encounter Diagnosis  Name Primary?   Microcytic anemia Yes     CHIEF COMPLIANT: Severe anemia  HISTORY OF PRESENT ILLNESS:   History of Present Illness Shirley Juarez is a 73 year old female with anemia who presents with persistent low hemoglobin levels and fatigue.  She has persistent anemia with hemoglobin levels dropping to the six range, requiring frequent blood transfusions. Despite a normal bone marrow biopsy, she continues to need transfusions. No recent CT scans have been performed to check for internal bleeding, though endoscopy and colonoscopy were done three years ago.  She experiences extreme fatigue and occasional dizziness upon standing. Her iron  levels were negligible on September 3rd, and her last iron  infusion was on August 4th, indicating a need for further supplementation.  She observes blood in her stool consistently, described as 'watered down red' without clots, noticeable if the stool sits before flushing. Her B12 and folate levels were normal in July.     ALLERGIES:  is allergic to latex, codeine, covid-19 (mrna bivalent) vaccine (pfizer) [covid-19 (mrna) vaccine], crestor [rosuvastatin calcium ], ezetimibe , fish oil, hydralazine , lipitor [atorvastatin calcium ], peanut-containing drug products, statins, welchol [colesevelam hcl], zithromax [azithromycin], and other.  MEDICATIONS:  Current Outpatient Medications  Medication Sig Dispense Refill   acetaminophen  (TYLENOL ) 325 MG tablet Take 325-650 mg by mouth every 6 (six) hours as needed for mild pain or headache.     ALPRAZolam  (XANAX ) 0.5 MG tablet Take 0.5 mg by mouth in the morning and at bedtime.     amoxicillin  (AMOXIL ) 500 MG capsule amoxicillin  500 mg capsule  TAKE 4 CAPSULES BY MOUTH 1 HOUR PRIOR TO DENTAL APPOINTMENT THEN 2 CAPSULES BY  MOUTH AFTER APPOINTMENT     Apremilast  (OTEZLA ) 30 MG TABS Take 1 tablet (30 mg total) by mouth 2 (two) times daily. 60 tablet 5   aspirin  EC 81 MG tablet Take 81 mg by mouth at bedtime.       Blood Glucose Monitoring Suppl (ONETOUCH VERIO) w/Device KIT use to test your blood sugar     Calcium  Citrate-Vitamin D  (CALCIUM  CITRATE + D3 PO) Take 1 tablet by mouth every Monday, Wednesday, and Friday.     cetirizine (ZYRTEC) 5 MG tablet Take 5 mg by mouth daily.     clobetasol  (TEMOVATE ) 0.05 % external solution Apply once or twice daily to affected areas on scalp as needed for scaling/itching. Avoid applying to face, groin, and axilla. 50 mL 3   docusate sodium (COLACE) 100 MG capsule Take 100 mg by mouth daily.     fluticasone-salmeterol (ADVAIR) 250-50 MCG/ACT AEPB Inhale into the lungs.     gemfibrozil  (LOPID ) 600 MG tablet Take by mouth.     glucose blood (ONETOUCH VERIO) test strip use to test your blood sugar     hydrALAZINE  (APRESOLINE ) 100 MG tablet TAKE 1 TABLET BY MOUTH 3 TIMES A DAY 270 tablet 1   hydrOXYzine  (ATARAX /VISTARIL ) 25 MG tablet Take 25 mg by mouth at bedtime.       ketoconazole (NIZORAL) 2 % shampoo Apply 1 Application topically 2 (two) times a week.     lansoprazole  (PREVACID ) 30 MG capsule TAKE 1 CAPSULE BY MOUTH TWICE A DAY BEFORE MEALS 60 capsule 4   metFORMIN  (GLUCOPHAGE ) 500 MG tablet Take 1,000 mg by mouth daily with breakfast.     ondansetron  (ZOFRAN ) 4  MG tablet Take 1 tablet (4 mg total) by mouth as directed. Take one Zofran  pill 30-60 minutes before each colonoscopy prep dose 2 tablet 0   tamsulosin  (FLOMAX ) 0.4 MG CAPS capsule Take 1 capsule (0.4 mg total) by mouth daily after supper. 30 capsule 2   venlafaxine  XR (EFFEXOR -XR) 150 MG 24 hr capsule Take 150 mg by mouth at bedtime.     Current Facility-Administered Medications  Medication Dose Route Frequency Provider Last Rate Last Admin   0.9 %  sodium chloride  infusion  500 mL Intravenous Once Mansouraty, Aloha Raddle., MD        PHYSICAL EXAMINATION: ECOG PERFORMANCE STATUS: 1 - Symptomatic but completely ambulatory  Vitals:   03/11/24 1124  BP: (!) 141/40  Pulse: 90  Resp: 18  Temp: (!) 97.5 F (36.4 C)  SpO2: 100%   Filed Weights   03/11/24 1124  Weight: 149 lb 1.6 oz (67.6 kg)    Physical Exam   (exam performed in the presence of a chaperone)  LABORATORY DATA:  I have reviewed the data as listed    Latest Ref Rng & Units 03/11/2024   10:57 AM 12/30/2023   11:48 AM 02/13/2021    5:58 PM  CMP  Glucose 70 - 99 mg/dL 834  828  859   BUN 8 - 23 mg/dL 13  12  16    Creatinine 0.44 - 1.00 mg/dL 9.30  9.18  9.08   Sodium 135 - 145 mmol/L 135  136  124   Potassium 3.5 - 5.1 mmol/L 4.4  3.5  3.7   Chloride 98 - 111 mmol/L 102  104  91   CO2 22 - 32 mmol/L 27  26  19    Calcium  8.9 - 10.3 mg/dL 9.2  8.7  9.3   Total Protein 6.5 - 8.1 g/dL 6.9  6.6  6.9   Total Bilirubin 0.0 - 1.2 mg/dL 0.3  0.3  0.5   Alkaline Phos 38 - 126 U/L 69  64  71   AST 15 - 41 U/L 25  22  14    ALT 0 - 44 U/L 12  10  12      Lab Results  Component Value Date   WBC 2.5 (L) 03/11/2024   HGB 6.6 (LL) 03/11/2024   HCT 21.0 (L) 03/11/2024   MCV 74.7 (L) 03/11/2024   PLT 293 03/11/2024   NEUTROABS 1.7 03/11/2024    ASSESSMENT & PLAN:  Microcytic anemia Discovered during her hospitalization in August 2022 (she was hospitalized for severe UTIs) hemoglobin was 6.9 MCV 72 (1 unit of PRBC and IV iron  x1 dose) Upper endoscopy and colonoscopy performed November 2022: No evidence of bleeding   IV Iron : Dec 2022, Mar 2023, October 2023, May 2024, October 2024, March 2025, June 2025, August 2025   Lab Review: 08/02/21: WBC: 3.5, Hb 11.3, Ferritin: 20, Iron  Sat: 7%, TIBC: 553 11/14/21: Ferritin 103, Hb 12.3, Sat: 11% 02/19/2022: Ferritin 21, iron  saturation 7%, TIBC 545, B12 739, hemoglobin 10.6, folate 13.5 06/25/22: Hb 10.9, MCV 82, Iron  Sat 8%, TIBC 487, Ferritin 33 10/17/2022: Hemoglobin 7.4, MCV 68.5, RDW 16,  ferritin 6, iron  saturation 3%, TIBC 633 02/05/2023: Hemoglobin 8.6, MCV 71.2, iron  saturation 4%, TIBC 596, ferritin 9 05/08/2023: Hemoglobin 10.9, MCV 81.7, WBC 3.2, iron  saturation 11%, ferritin 75 08/12/2023: Hemoglobin 8.6, MCV 74.3, iron  saturation 3%, TIBC 615, ferritin 8 11/01/2023: Hemoglobin 8.5, MCV 75.6, iron  saturation 4%, ferritin 23 12/05/23: Hemoglobin: 7.3, MCV 83.6, WBC: 1.8, ANC 0.8,  Iron  sat: 6%, Ferritin 140 12/22/2023: Hemoglobin 6.5, MCV 75.1, WBC 1.9, ANC 1.1, platelets 246, reticulocyte hemoglobin 15.5, reticulocyte percent: 2.7%, Cr 0.81, Ferritin 11.9, B 12 861 02/05/2024: Iron  saturation 5%, TIBC 456, hemoglobin 7.2, ferritin pending, ANC 1.1, WBC 1.9 03/11/2024: Hemoglobin 6.6, MCV 74.7 (patient is reporting blood in the stool)  03/02/2024: Bone marrow biopsy: Cellular bone marrow with trilineage hematopoiesis, cytogenetics 46XX normal  Recommendation: 2 units of PRBC IV iron  infusions Consult Dr. Elicia for endoscopy and colonoscopy for the GI blood loss Recheck labs including hemolysis blood work when she comes back in a month ------------------------------------- Assessment and Plan Assessment & Plan Iron  deficiency anemia Severe iron  deficiency anemia with hemoglobin at 6.x, requiring transfusions. Bone marrow biopsy negative for malignancy. No chromosomal abnormalities. Suspected internal bleeding with bright red blood in stool. Possible inadequate marrow production. Hemolysis due to heart valve issues unlikely. - Order whole body CT scan for internal bleeding. - Refer to gastroenterologist for colonoscopy and endoscopy. - Administer blood transfusion today. - Schedule iron  infusion at [facility], two doses one week apart. - Recheck iron  levels and assess for hemolysis in three weeks. - Document blood in stool occurrences.      No orders of the defined types were placed in this encounter.  The patient has a good understanding of the overall plan.  she agrees with it. she will call with any problems that may develop before the next visit here.  I personally spent a total of 30 minutes in the care of the patient today including preparing to see the patient, getting/reviewing separately obtained history, performing a medically appropriate exam/evaluation, counseling and educating, placing orders, referring and communicating with other health care professionals, documenting clinical information in the EHR, independently interpreting results, communicating results, and coordinating care.   Viinay K Paysen Goza, MD 03/11/24

## 2024-03-12 ENCOUNTER — Ambulatory Visit: Admitting: Dermatology

## 2024-03-12 ENCOUNTER — Encounter: Payer: Self-pay | Admitting: Dermatology

## 2024-03-12 ENCOUNTER — Telehealth: Payer: Self-pay

## 2024-03-12 VITALS — BP 119/69 | HR 87

## 2024-03-12 DIAGNOSIS — L820 Inflamed seborrheic keratosis: Secondary | ICD-10-CM | POA: Diagnosis not present

## 2024-03-12 DIAGNOSIS — Z1211 Encounter for screening for malignant neoplasm of colon: Secondary | ICD-10-CM | POA: Insufficient documentation

## 2024-03-12 DIAGNOSIS — D649 Anemia, unspecified: Secondary | ICD-10-CM

## 2024-03-12 DIAGNOSIS — L509 Urticaria, unspecified: Secondary | ICD-10-CM | POA: Insufficient documentation

## 2024-03-12 DIAGNOSIS — L821 Other seborrheic keratosis: Secondary | ICD-10-CM

## 2024-03-12 DIAGNOSIS — L219 Seborrheic dermatitis, unspecified: Secondary | ICD-10-CM

## 2024-03-12 DIAGNOSIS — E1165 Type 2 diabetes mellitus with hyperglycemia: Secondary | ICD-10-CM | POA: Insufficient documentation

## 2024-03-12 NOTE — Progress Notes (Signed)
   Follow-Up Visit   Subjective  Shirley Juarez is a 73 y.o. female who presents for the following: Psoriasis  Patient present today for follow up visit for Psoriasis. Patient was last evaluated on 12/09/23. At this visit patient was advised to continue Otezla  and Clobetasol  solution for the scalp. She was also recommended to wash with [employer] Zinc Shampoo. She reports she she is still taking Otezla  2 times daily and applying Clobetasol  solution but the spots are still on her scalp. She also reports having joint pains. She rates her joint pains 10 out of 10 when it's hurting but it is consistent. Patient reports sxs are unchanged. Patient denies medication changes.  The following portions of the chart were reviewed this encounter and updated as appropriate: medications, allergies, medical history  Review of Systems:  No other skin or systemic complaints except as noted in HPI or Assessment and Plan.  Objective  Well appearing patient in no apparent distress; mood and affect are within normal limits.  A focused examination was performed of the following areas: Scalp  Relevant exam findings are noted in the Assessment and Plan.              Mid Frontal Scalp, Mid Parietal Scalp Irritated waxy papule  Assessment & Plan    Seborrheic keratosis and seborrheic dermatitis Thick plaques on the left side of the scalp and the back of the neck were initially suspected to be psoriasis (rethinking the dx). After treatment with clobetasol , inflammation subsided, revealing growths consistent with seborrheic keratosis and surrounding seborrheic dermatitis. Clobetasol  cleared the dandruff, and no signs of psoriasis are currently visible. Joint pain and low hemoglobin levels complicate treatment options.  - Discontinue Otezla . - Cryotherapy for seborrheic keratosis. - Apply Aquaphor daily to treated areas. - Use clobetasol  as needed for pruritus. - Wash with zinc shampoo for seborrheic  dermatitis. - Reassess in six months.    Anemia and low hemoglobin Hemoglobin level is 6, indicating severe anemia. She is scheduled for blood transfusions and iron  infusions. CT scan is scheduled to evaluate underlying causes. No abnormal T cells or cancer detected in bone marrow biopsy. - CT scan is scheduled. - Proceed with blood transfusion and iron  infusions. - Advise to contact via MyChart for concerns or questions.  INFLAMED SEBORRHEIC KERATOSIS (2) Mid Frontal Scalp, Mid Parietal Scalp Destruction of lesion - Mid Frontal Scalp, Mid Parietal Scalp Complexity: simple   Destruction method: cryotherapy   Informed consent: discussed and consent obtained   Timeout:  patient name, date of birth, surgical site, and procedure verified Lesion destroyed using liquid nitrogen: Yes   Cryotherapy cycles:  4 Post-procedure details: wound care instructions given     Return in about 6 months (around 09/10/2024) for SK F/U.  I, Jetta Ager, am acting as Neurosurgeon for [employer], DO.  Documentation: I have reviewed the above documentation for accuracy and completeness, and I agree with the above.  Delon Lenis, DO

## 2024-03-12 NOTE — Telephone Encounter (Signed)
-----   Message from [facility] sent at 03/11/2024  4:53 PM EDT ----- Regarding: RE: iron  def anemia VG, No worries.  Toi Stelly, This patient needs updated EGD/Colonoscopy. Can be done in the [facility] next available with me. If this is negative she will need a VCE. Diagnosis is Iron  Deficiency Anemia & Rectal bleeding. Thank you. GM ----- Message ----- From: Odean Potts, MD Sent: 03/11/2024   4:12 PM EDT To: Layla Lah, MD; Aloha Wilhelmenia Raddle.# Subject: RE: iron  def anemia                            I clarified. She did not undergo any recent scopes. Will edit my note. Thx Thanks for agreeing to see her. Vinay ----- Message ----- From: Wilhelmenia Aloha Raddle., MD Sent: 03/11/2024  12:53 PM EDT To: Layla Lah, MD; Potts Odean, MD Subject: RE: iron  def anemia                            VG, I can work on getting her scheduled for procedures. However, you last note in the HPI says that someone did a recent colonoscopy on her for bleeding in stool. We haven't performed any procedures on her in years. If another GI provider has done a recent scope, we need to send her to that person. Is your note correct? Thanks. GM ----- Message ----- From: Lah Layla, MD Sent: 03/11/2024  11:55 AM EDT To: Potts Odean, MD; Aloha Wilhelmenia Raddle., MD Subject: RE: iron  def anemia                            She is followed by [facility] Dr. Wilhelmenia. Adding him to message. Thanks. ----- Message ----- From: Odean Potts, MD Sent: 03/11/2024  11:52 AM EDT To: Layla Lah, MD Subject: iron  def anemia                                Parag, Can you please scope her for anemia? Thanks [employer]

## 2024-03-12 NOTE — Patient Instructions (Addendum)
 VISIT SUMMARY:  During today's visit, we discussed your persistent skin plaques, joint pain, and anemia. We reviewed your current treatments and made some adjustments to better manage your conditions. We also discussed your upcoming CT scan and the results of your recent tests.  YOUR PLAN:  -SEBORRHEIC KERATOSIS AND SEBORRHEIC DERMATITIS: Seborrheic keratosis is a non-cancerous skin growth, and seborrheic dermatitis is a type of eczema that causes dandruff and scaly patches. We will discontinue Otezla  and treat the seborrheic keratosis with cryotherapy. You should apply Aquaphor daily to the treated areas, use clobetasol  as needed for itching, and wash with zinc shampoo for seborrheic dermatitis. We will reassess in six months.    INSTRUCTIONS:  You are scheduled for a CT scan next week. Please proceed with the blood transfusions and iron  infusions as planned. If you have any concerns or questions, contact us  via MyChart.   Cryotherapy Aftercare  Wash gently with soap and water everyday.   Apply Vaseline and Band-Aid daily until healed.   Important Information   Due to recent changes in healthcare laws, you may see results of your pathology and/or laboratory studies on MyChart before the doctors have had a chance to review them. We understand that in some cases there may be results that are confusing or concerning to you. Please understand that not all results are received at the same time and often the doctors may need to interpret multiple results in order to provide you with the best plan of care or course of treatment. Therefore, we ask that you please give us  2 business days to thoroughly review all your results before contacting the office for clarification. Should we see a critical lab result, you will be contacted sooner.     If You Need Anything After Your Visit   If you have any questions or concerns for your doctor, please call our main line at [phone]. If no one answers,  please leave a voicemail as directed and we will return your call as soon as possible. Messages left after 4 pm will be answered the following business day.    You may also send us  a message via MyChart. We typically respond to MyChart messages within 1-2 business days.  For prescription refills, please ask your pharmacy to contact our office. Our fax number is [phone].  If you have an urgent issue when the clinic is closed that cannot wait until the next business day, you can page your doctor at the number below.     Please note that while we do our best to be available for urgent issues outside of office hours, we are not available 24/7.    If you have an urgent issue and are unable to reach us , you may choose to seek medical care at your doctor's office, retail clinic, urgent care center, or emergency room.   If you have a medical emergency, please immediately call 911 or go to the emergency department. In the event of inclement weather, please call our main line at [phone] for an update on the status of any delays or closures.  Dermatology Medication Tips: Please keep the boxes that topical medications come in in order to help keep track of the instructions about where and how to use these. Pharmacies typically print the medication instructions only on the boxes and not directly on the medication tubes.   If your medication is too expensive, please contact our office at [phone] or send us  a message through MyChart.    We  are unable to tell what your co-pay for medications will be in advance as this is different depending on your insurance coverage. However, we may be able to find a substitute medication at lower cost or fill out paperwork to get insurance to cover a needed medication.    If a prior authorization is required to get your medication covered by your insurance company, please allow us  1-2 business days to complete this process.   Drug prices often vary depending  on where the prescription is filled and some pharmacies may offer cheaper prices.   The website www.goodrx.com contains coupons for medications through different pharmacies. The prices here do not account for what the cost may be with help from insurance (it may be cheaper with your insurance), but the website can give you the price if you did not use any insurance.  - You can print the associated coupon and take it with your prescription to the pharmacy.  - You may also stop by our office during regular business hours and pick up a GoodRx coupon card.  - If you need your prescription sent electronically to a different pharmacy, notify our office through [facility] or by phone at [phone]

## 2024-03-12 NOTE — Telephone Encounter (Signed)
 Can one of you please set up previsit and endo colon in the [facility] please thank you

## 2024-03-13 ENCOUNTER — Ambulatory Visit

## 2024-03-13 DIAGNOSIS — D509 Iron deficiency anemia, unspecified: Secondary | ICD-10-CM | POA: Diagnosis not present

## 2024-03-13 MED ORDER — ACETAMINOPHEN 325 MG PO TABS
650.0000 mg | ORAL_TABLET | Freq: Once | ORAL | Status: AC
  Administered 2024-03-13: 650 mg via ORAL
  Filled 2024-03-13: qty 2

## 2024-03-13 MED ORDER — DIPHENHYDRAMINE HCL 25 MG PO CAPS
25.0000 mg | ORAL_CAPSULE | Freq: Once | ORAL | Status: AC
  Administered 2024-03-13: 25 mg via ORAL
  Filled 2024-03-13: qty 1

## 2024-03-13 MED ORDER — SODIUM CHLORIDE 0.9% IV SOLUTION
250.0000 mL | INTRAVENOUS | Status: DC
  Administered 2024-03-13: 100 mL via INTRAVENOUS

## 2024-03-13 NOTE — Patient Instructions (Signed)

## 2024-03-14 LAB — TYPE AND SCREEN
ABO/RH(D): O NEG
Antibody Screen: POSITIVE
Donor AG Type: NEGATIVE
Donor AG Type: NEGATIVE
PT AG Type: NEGATIVE
Unit division: 0
Unit division: 0

## 2024-03-14 LAB — BPAM RBC
Blood Product Expiration Date: 202510222359
Blood Product Expiration Date: 202510232359
ISSUE DATE / TIME: 202510101159
ISSUE DATE / TIME: 202510101159
Unit Type and Rh: 9500
Unit Type and Rh: 9500

## 2024-03-19 ENCOUNTER — Ambulatory Visit (INDEPENDENT_AMBULATORY_CARE_PROVIDER_SITE_OTHER): Admitting: *Deleted

## 2024-03-19 VITALS — BP 165/70 | HR 78 | Temp 97.5°F | Resp 16 | Ht 61.0 in | Wt 148.0 lb

## 2024-03-19 DIAGNOSIS — D509 Iron deficiency anemia, unspecified: Secondary | ICD-10-CM

## 2024-03-19 MED ORDER — SODIUM CHLORIDE 0.9 % IV SOLN
510.0000 mg | Freq: Once | INTRAVENOUS | Status: AC
  Administered 2024-03-19: 510 mg via INTRAVENOUS
  Filled 2024-03-19: qty 17

## 2024-03-19 NOTE — Patient Instructions (Signed)

## 2024-03-19 NOTE — Progress Notes (Signed)
 Diagnosis: Iron  Deficiency Anemia  Provider:  Mannam, Praveen MD  Procedure: IV Infusion  IV Type: Peripheral, IV Location: L Antecubital  Feraheme (Ferumoxytol ), Dose: 510 mg  Infusion Start Time: 1440 pm  Infusion Stop Time: 1505 pm  Post Infusion IV Care: Observation period completed and Peripheral IV Discontinued  Discharge: Condition: Good, Destination: Home . AVS Provided  Performed by:  Trudy Lamarr LABOR, RN

## 2024-03-20 ENCOUNTER — Ambulatory Visit (HOSPITAL_COMMUNITY)
Admission: RE | Admit: 2024-03-20 | Discharge: 2024-03-20 | Disposition: A | Discharge status: Home / Self Care | Source: Ambulatory Visit | Attending: Hematology and Oncology | Admitting: Hematology and Oncology

## 2024-03-20 DIAGNOSIS — R140 Abdominal distension (gaseous): Secondary | ICD-10-CM | POA: Insufficient documentation

## 2024-03-20 DIAGNOSIS — R1084 Generalized abdominal pain: Secondary | ICD-10-CM | POA: Diagnosis present

## 2024-03-20 MED ORDER — SODIUM CHLORIDE (PF) 0.9 % IJ SOLN
INTRAMUSCULAR | Status: AC
  Filled 2024-03-20: qty 50

## 2024-03-20 MED ORDER — IOHEXOL 300 MG/ML  SOLN
100.0000 mL | Freq: Once | INTRAMUSCULAR | Status: AC | PRN
  Administered 2024-03-20: 100 mL via INTRAVENOUS

## 2024-03-23 ENCOUNTER — Telehealth: Payer: Self-pay

## 2024-03-23 ENCOUNTER — Encounter (HOSPITAL_COMMUNITY): Payer: Self-pay | Admitting: Hematology and Oncology

## 2024-03-23 NOTE — Telephone Encounter (Signed)
 S/w patient regarding new episodes of blood clots in stool. Patient reports having to pass stool frequently and has been noting small to medium size blood clots with every bowel movement. Patient notes the clots as dark red to bright red in color.  Patient denies any dizziness at this time, but notes feeling very fatigued. No straining noted at this time.  Advised patient that she needs emergent follow-up at the ED for evaluation of possible GI bleed. Placed stat read for recent CT scan. Dr. Gudena made aware of the situation and agreeable to having patient received follow-up in the ED. Patient states she will think about it. RN reiterated the importance of going to the emergency room for eval multiple times.  Patient verbalized an understanding of the instructions.

## 2024-03-24 ENCOUNTER — Encounter: Payer: Self-pay | Admitting: Gastroenterology

## 2024-03-24 NOTE — Telephone Encounter (Signed)
 Noted

## 2024-03-25 ENCOUNTER — Telehealth: Payer: Self-pay | Admitting: *Deleted

## 2024-03-25 ENCOUNTER — Telehealth: Payer: Self-pay

## 2024-03-25 NOTE — Telephone Encounter (Signed)
 Dyan Rush, CRNA  Mansouraty, Aloha Raddle., MD; Wallene Larraine CROME, RN; Odean Potts, MD; [facility] BC 3; Anitra Odetta CROME, RN Dr. Wilhelmenia,  This pt is cleared for anesthetic care at [facility] for endocolon on 11/12.  Regards,  Rush EMERSON Dyan

## 2024-03-25 NOTE — Telephone Encounter (Signed)
-----   Message from [facility] sent at 03/25/2024  4:31 PM EDT ----- Regarding: RE: colonoscopy and endoscopy Alvina Strother, Let's try to add on 11/12 (1000 AM slot) for double procedure. I am the ERCP backup, but I need to get this patient in. I'm tagging John on here as well, to confirm that I would still have a CRNA for that morning. If we hear the OK, then schedule accordingly in the [facility] for EGD/Colonoscopy.. Otherwise, we need to get her on the books with one of the other partners. Thanks. GM  John, Let us  know if we can add-on for the 1000AM arrival slot (really the 1100/1130 for procedure time). Thanks. GM ----- Message ----- From: Wilhelmenia Aloha Raddle., MD Sent: 03/25/2024   4:34 PM EDT To: Rudean LITTIE Franks, LPN; Thu Ladora Queen, RN; Claribel Sachs# Subject: RE: colonoscopy and endoscopy                  Katonya Blecher, Let's try to add on 11/12 (1000 AM slot) for double procedure. I am the ERCP backup, but I need to get this patient in. I'm tagging John on here as well, to confirm that I would still have a CRNA for that morning. If we hear the OK, then schedule accordingly in the [facility] for EGD/Colonoscopy.. Otherwise, we need to get her on the books with one of the other partners. Thanks. GM  John, Let us  know if we can add-on for the 1000AM arrival slot (really the 1100/1130 for procedure time). Thanks. GM ----- Message ----- From: Wallene Larraine LITTIE, RN Sent: 03/25/2024  10:59 AM EDT To: Rudean LITTIE Franks, LPN; Thu Ladora Queen, RN; Sandr# Subject: colonoscopy and endoscopy                      Hi Dr. Wilhelmenia.  Dr. Odean wanted our office to reach out to you regarding Ms. Traub and the need for a colonoscopy and endoscopy. She continues to experience severe anemia despite a normal bone marrow biopsy and Dr. Odean is concerned for GI bleed.  I reached out to your office today and they stated the soonest she could have the colonoscopy and endoscopy would be December of 2025.  We wanted to  reach out to see if you could perform these any sooner or if there was someone in your practice that could could do this sooner for her to prevent hospitalization.  Thank you so much.   Larraine, RN with Dr. Gudena.

## 2024-03-25 NOTE — Telephone Encounter (Signed)
 Received call from pt stating [facility] has scheduled her colonoscopy with Dr. Wilhelmenia in December of 2025.  Per MD pt needing colonoscopy and endoscopy as soon as possible to rule out GI bleed.  RN placed call to office to request sooner appt as well as routed recent office not to Dr. Wilhelmenia.

## 2024-03-25 NOTE — Telephone Encounter (Signed)
 Inbound call from patient doctor office and was informed that this patient was needing a sooner endo and colon with anyone here. I did advise them that we her doctor here was Dr. Wilhelmenia and she would have to been schedule with him. I informed patty of the situation and call the patient back to reschedule her procedures. Patient was advised that we could get her in as soon as December the 10 th, and add her to the cancellation list in case anyone happened to cancel. Patient stated that she did not want to schedule anymore. Please advise.

## 2024-03-26 ENCOUNTER — Ambulatory Visit: Admitting: *Deleted

## 2024-03-26 VITALS — BP 123/64 | HR 83 | Temp 97.4°F | Resp 20 | Ht 63.0 in | Wt 145.8 lb

## 2024-03-26 DIAGNOSIS — D509 Iron deficiency anemia, unspecified: Secondary | ICD-10-CM

## 2024-03-26 MED ORDER — SODIUM CHLORIDE 0.9 % IV SOLN
510.0000 mg | Freq: Once | INTRAVENOUS | Status: AC
  Administered 2024-03-26: 510 mg via INTRAVENOUS
  Filled 2024-03-26: qty 17

## 2024-03-26 NOTE — Progress Notes (Signed)
 Diagnosis: Iron  Deficiency Anemia  Provider:  Mannam, Praveen MD  Procedure: IV Infusion  IV Type: Peripheral, IV Location: L Antecubital  Feraheme (Ferumoxytol ), Dose: 510 mg  Infusion Start Time: 1142 am  Infusion Stop Time: 1210 pm  Post Infusion IV Care: Observation period completed and Peripheral IV Discontinued  Discharge: Condition: Good, Destination: Home . AVS Provided  Performed by:  Trudy Lamarr LABOR, RN

## 2024-03-26 NOTE — Telephone Encounter (Signed)
 The pt has been set up for previsit and colon endo on 11/12 at 10 am per GM.  The pt has been advised of the information and verbalized understanding.

## 2024-03-26 NOTE — Telephone Encounter (Signed)
-----   Message from [facility] sent at 03/26/2024  4:03 AM EDT ----- Regarding: RE: colonoscopy and endoscopy Korayma Hagwood, We have approval to have anesthesia care extended on 11/12.  Please add her to the 03/1029 arrival time slots and I will do her case at that time for EGD/colonoscopy iron  deficiency anemia. Thanks. GM ----- Message ----- From: Dyan Rush, CRNA Sent: 03/25/2024   4:56 PM EDT To: Rudean LITTIE Franks, LPN; Thu Ladora Queen, RN; Taina Landry# Subject: RE: colonoscopy and endoscopy                  Dr. Wilhelmenia,  This pt is cleared for anesthetic care at [facility] for endocolon on 11/12.  Regards,  Rush EMERSON Dyan ----- Message ----- From: Wilhelmenia Aloha Raddle., MD Sent: 03/25/2024   4:35 PM EDT To: Rush Dyan, CRNA; Rudean LITTIE Franks, LPN; Thu # Subject: RE: colonoscopy and endoscopy                  Amarri Satterly, Let's try to add on 11/12 (1000 AM slot) for double procedure. I am the ERCP backup, but I need to get this patient in. I'm tagging John on here as well, to confirm that I would still have a CRNA for that morning. If we hear the OK, then schedule accordingly in the [facility] for EGD/Colonoscopy.. Otherwise, we need to get her on the books with one of the other partners. Thanks. GM  John, Let us  know if we can add-on for the 1000AM arrival slot (really the 1100/1130 for procedure time). Thanks. GM ----- Message ----- From: Wilhelmenia Aloha Raddle., MD Sent: 03/25/2024   4:34 PM EDT To: Rudean LITTIE Franks, LPN; Thu Ladora Queen, RN; Corneisha Alvi# Subject: RE: colonoscopy and endoscopy                  Lya Holben, Let's try to add on 11/12 (1000 AM slot) for double procedure. I am the ERCP backup, but I need to get this patient in. I'm tagging John on here as well, to confirm that I would still have a CRNA for that morning. If we hear the OK, then schedule accordingly in the [facility] for EGD/Colonoscopy.. Otherwise, we need to get her on the books with one of the other  partners. Thanks. GM  John, Let us  know if we can add-on for the 1000AM arrival slot (really the 1100/1130 for procedure time). Thanks. GM ----- Message ----- From: Wallene Larraine LITTIE, RN Sent: 03/25/2024  10:59 AM EDT To: Rudean LITTIE Franks, LPN; Thu Ladora Queen, RN; Sandr# Subject: colonoscopy and endoscopy                      Hi Dr. Wilhelmenia.  Dr. Odean wanted our office to reach out to you regarding Ms. Malson and the need for a colonoscopy and endoscopy. She continues to experience severe anemia despite a normal bone marrow biopsy and Dr. Odean is concerned for GI bleed.  I reached out to your office today and they stated the soonest she could have the colonoscopy and endoscopy would be December of 2025.  We wanted to reach out to see if you could perform these any sooner or if there was someone in your practice that could could do this sooner for her to prevent hospitalization.  Thank you so much.   Larraine, RN with Dr. Gudena.

## 2024-03-27 ENCOUNTER — Other Ambulatory Visit: Payer: Self-pay | Admitting: *Deleted

## 2024-03-27 DIAGNOSIS — D509 Iron deficiency anemia, unspecified: Secondary | ICD-10-CM

## 2024-03-30 ENCOUNTER — Inpatient Hospital Stay

## 2024-03-30 DIAGNOSIS — D509 Iron deficiency anemia, unspecified: Secondary | ICD-10-CM

## 2024-03-30 LAB — IRON AND IRON BINDING CAPACITY (CC-WL,HP ONLY)
Iron: 106 ug/dL (ref 28–170)
Saturation Ratios: 22 % (ref 10.4–31.8)
TIBC: 480 ug/dL — ABNORMAL HIGH (ref 250–450)
UIBC: 374 ug/dL (ref 148–442)

## 2024-03-30 LAB — CBC WITH DIFFERENTIAL (CANCER CENTER ONLY)
Abs Immature Granulocytes: 0.05 K/uL (ref 0.00–0.07)
Basophils Absolute: 0 K/uL (ref 0.0–0.1)
Basophils Relative: 1 %
Eosinophils Absolute: 0.1 K/uL (ref 0.0–0.5)
Eosinophils Relative: 2 %
HCT: 28.8 % — ABNORMAL LOW (ref 36.0–46.0)
Hemoglobin: 9.1 g/dL — ABNORMAL LOW (ref 12.0–15.0)
Immature Granulocytes: 2 %
Lymphocytes Relative: 18 %
Lymphs Abs: 0.6 K/uL — ABNORMAL LOW (ref 0.7–4.0)
MCH: 26.6 pg (ref 26.0–34.0)
MCHC: 31.6 g/dL (ref 30.0–36.0)
MCV: 84.2 fL (ref 80.0–100.0)
Monocytes Absolute: 0.2 K/uL (ref 0.1–1.0)
Monocytes Relative: 8 %
Neutro Abs: 2.1 K/uL (ref 1.7–7.7)
Neutrophils Relative %: 69 %
Platelet Count: 362 K/uL (ref 150–400)
RBC: 3.42 MIL/uL — ABNORMAL LOW (ref 3.87–5.11)
RDW: 26.4 % — ABNORMAL HIGH (ref 11.5–15.5)
WBC Count: 3 K/uL — ABNORMAL LOW (ref 4.0–10.5)
nRBC: 0 % (ref 0.0–0.2)

## 2024-03-30 LAB — SAMPLE TO BLOOD BANK

## 2024-03-30 LAB — CMP (CANCER CENTER ONLY)
ALT: 15 U/L (ref 0–44)
AST: 28 U/L (ref 15–41)
Albumin: 4.3 g/dL (ref 3.5–5.0)
Alkaline Phosphatase: 74 U/L (ref 38–126)
Anion gap: 7 (ref 5–15)
BUN: 11 mg/dL (ref 8–23)
CO2: 27 mmol/L (ref 22–32)
Calcium: 9 mg/dL (ref 8.9–10.3)
Chloride: 101 mmol/L (ref 98–111)
Creatinine: 0.88 mg/dL (ref 0.44–1.00)
GFR, Estimated: >60 mL/min (ref >60)
Glucose, Bld: 97 mg/dL (ref 70–99)
Potassium: 4.1 mmol/L (ref 3.5–5.1)
Sodium: 135 mmol/L (ref 135–145)
Total Bilirubin: 0.3 mg/dL (ref 0.0–1.2)
Total Protein: 7.1 g/dL (ref 6.5–8.1)

## 2024-03-30 LAB — LACTATE DEHYDROGENASE: LDH: 212 U/L — ABNORMAL HIGH (ref 98–192)

## 2024-03-30 LAB — FERRITIN: Ferritin: 627 ng/mL — ABNORMAL HIGH (ref 11–307)

## 2024-03-31 LAB — HAPTOGLOBIN: Haptoglobin: 93 mg/dL (ref 42–346)

## 2024-04-01 ENCOUNTER — Inpatient Hospital Stay (HOSPITAL_BASED_OUTPATIENT_CLINIC_OR_DEPARTMENT_OTHER): Admitting: Hematology and Oncology

## 2024-04-01 VITALS — BP 118/42 | HR 86 | Temp 98.2°F | Resp 18 | Ht 63.0 in | Wt 146.1 lb

## 2024-04-01 DIAGNOSIS — D509 Iron deficiency anemia, unspecified: Secondary | ICD-10-CM | POA: Diagnosis not present

## 2024-04-01 NOTE — Assessment & Plan Note (Addendum)
 Discovered during her hospitalization in August 2022 (she was hospitalized for severe UTIs) hemoglobin was 6.9 MCV 72 (1 unit of PRBC and IV iron  x1 dose) Upper endoscopy and colonoscopy performed November 2022: No evidence of bleeding   IV Iron : Dec 2022, Mar 2023, October 2023, May 2024, October 2024, March 2025, June 2025, August 2025, October 2025   Lab Review: 08/02/21: WBC: 3.5, Hb 11.3, Ferritin: 20, Iron  Sat: 7%, TIBC: 553 11/14/21: Ferritin 103, Hb 12.3, Sat: 11% 02/19/2022: Ferritin 21, iron  saturation 7%, TIBC 545, B12 739, hemoglobin 10.6, folate 13.5 06/25/22: Hb 10.9, MCV 82, Iron  Sat 8%, TIBC 487, Ferritin 33 10/17/2022: Hemoglobin 7.4, MCV 68.5, RDW 16, ferritin 6, iron  saturation 3%, TIBC 633 02/05/2023: Hemoglobin 8.6, MCV 71.2, iron  saturation 4%, TIBC 596, ferritin 9 05/08/2023: Hemoglobin 10.9, MCV 81.7, WBC 3.2, iron  saturation 11%, ferritin 75 08/12/2023: Hemoglobin 8.6, MCV 74.3, iron  saturation 3%, TIBC 615, ferritin 8 11/01/2023: Hemoglobin 8.5, MCV 75.6, iron  saturation 4%, ferritin 23 12/05/23: Hemoglobin: 7.3, MCV 83.6, WBC: 1.8, ANC 0.8, Iron  sat: 6%, Ferritin 140 12/22/2023: Hemoglobin 6.5, MCV 75.1, WBC 1.9, ANC 1.1, platelets 246, reticulocyte hemoglobin 15.5, reticulocyte percent: 2.7%, Cr 0.81, Ferritin 11.9, B 12 861 02/05/2024: Iron  saturation 5%, TIBC 456, hemoglobin 7.2, ferritin pending, ANC 1.1, WBC 1.9 03/11/2024: Hemoglobin 6.6, MCV 74.7 (patient is reporting blood in the stool) 03/30/2024: Hemoglobin 9.1, MCV 84.2, LDH 212, iron  saturation 22%, haptoglobin 93, ferritin 627   03/02/2024: Bone marrow biopsy: Cellular bone marrow with trilineage hematopoiesis, cytogenetics 46XX normal   Recommendation: No need of additional IV iron  infusions at this time. Consult Dr. Elicia for endoscopy and colonoscopy for the GI blood loss  Return to clinic in 1 month with labs and follow-up

## 2024-04-01 NOTE — Progress Notes (Signed)
 Patient Care Team: James, Natalie W, PA-C as PCP - General (Family Medicine) Verlin Lonni BIRCH, MD as PCP - Cardiology (Cardiology)  DIAGNOSIS:  Encounter Diagnosis  Name Primary?   Iron  deficiency anemia, unspecified iron  deficiency anemia type Yes      CHIEF COMPLIANT: Follow-up of severe anemia and to review the results of scans  HISTORY OF PRESENT ILLNESS:   History of Present Illness Shirley Juarez is a 73 year old female who presents with ongoing issues related to blood loss and anemia. She is accompanied by her daughter.  She experiences persistent blood loss and anemia, with hemoglobin levels improving to 9.1 g/dL from previous lower levels over the last five months. Her white blood cell count has increased to 3.0 x 10^9/L from 1.8 x 10^9/L.  Bleeding began a couple of months ago, coinciding with increased avocado consumption. After discontinuing avocados and baby aspirin , there is no longer blood in her stool.  She feels very tired despite taking a B12 vitamin. Further blood work is scheduled in a couple of weeks.     ALLERGIES:  is allergic to latex, codeine, covid-19 (mrna bivalent) vaccine (pfizer) [covid-19 (mrna) vaccine], crestor [rosuvastatin calcium ], ezetimibe , fish oil, hydralazine , lipitor [atorvastatin calcium ], peanut-containing drug products, statins, welchol [colesevelam hcl], zithromax [azithromycin], and other.  MEDICATIONS:  Current Outpatient Medications  Medication Sig Dispense Refill   acetaminophen  (TYLENOL ) 325 MG tablet Take 325-650 mg by mouth every 6 (six) hours as needed for mild pain or headache.     ALPRAZolam  (XANAX ) 0.5 MG tablet Take 0.5 mg by mouth in the morning and at bedtime.     aspirin  EC 81 MG tablet Take 81 mg by mouth at bedtime.       Blood Glucose Monitoring Suppl (ONETOUCH VERIO) w/Device KIT use to test your blood sugar     Calcium  Citrate-Vitamin D  (CALCIUM  CITRATE + D3 PO) Take 1 tablet by mouth every  Monday, Wednesday, and Friday.     cetirizine (ZYRTEC) 5 MG tablet Take 5 mg by mouth daily.     clobetasol  (TEMOVATE ) 0.05 % external solution Apply once or twice daily to affected areas on scalp as needed for scaling/itching. Avoid applying to face, groin, and axilla. 50 mL 3   docusate sodium (COLACE) 100 MG capsule Take 100 mg by mouth daily.     gemfibrozil  (LOPID ) 600 MG tablet Take by mouth.     glucose blood (ONETOUCH VERIO) test strip use to test your blood sugar     hydrALAZINE  (APRESOLINE ) 100 MG tablet TAKE 1 TABLET BY MOUTH 3 TIMES A DAY 270 tablet 1   hydrOXYzine  (ATARAX /VISTARIL ) 25 MG tablet Take 25 mg by mouth at bedtime.       ketoconazole (NIZORAL) 2 % shampoo Apply 1 Application topically 2 (two) times a week.     lansoprazole  (PREVACID ) 30 MG capsule TAKE 1 CAPSULE BY MOUTH TWICE A DAY BEFORE MEALS 60 capsule 4   metFORMIN  (GLUCOPHAGE ) 500 MG tablet Take 1,000 mg by mouth daily with breakfast.     tamsulosin  (FLOMAX ) 0.4 MG CAPS capsule Take 1 capsule (0.4 mg total) by mouth daily after supper. 30 capsule 2   venlafaxine  XR (EFFEXOR -XR) 150 MG 24 hr capsule Take 150 mg by mouth at bedtime.     amoxicillin  (AMOXIL ) 500 MG capsule amoxicillin  500 mg capsule  TAKE 4 CAPSULES BY MOUTH 1 HOUR PRIOR TO DENTAL APPOINTMENT THEN 2 CAPSULES BY MOUTH AFTER APPOINTMENT (Patient not taking: Reported on 04/01/2024)  Apremilast  (OTEZLA ) 30 MG TABS Take 1 tablet (30 mg total) by mouth 2 (two) times daily. (Patient not taking: Reported on 04/01/2024) 60 tablet 5   ondansetron  (ZOFRAN ) 4 MG tablet Take 1 tablet (4 mg total) by mouth as directed. Take one Zofran  pill 30-60 minutes before each colonoscopy prep dose (Patient not taking: Reported on 04/01/2024) 2 tablet 0   Current Facility-Administered Medications  Medication Dose Route Frequency Provider Last Rate Last Admin   0.9 %  sodium chloride  infusion  500 mL Intravenous Once Mansouraty, Aloha Raddle., MD        PHYSICAL  EXAMINATION: ECOG PERFORMANCE STATUS: 1 - Symptomatic but completely ambulatory  Vitals:   04/01/24 1416  BP: (!) 118/42  Pulse: 86  Resp: 18  Temp: 98.2 F (36.8 C)  SpO2: 98%   Filed Weights   04/01/24 1416  Weight: 146 lb 1.6 oz (66.3 kg)      LABORATORY DATA:  I have reviewed the data as listed    Latest Ref Rng & Units 03/30/2024   12:50 PM 03/11/2024   10:57 AM 12/30/2023   11:48 AM  CMP  Glucose 70 - 99 mg/dL 97  834  828   BUN 8 - 23 mg/dL 11  13  12    Creatinine 0.44 - 1.00 mg/dL 9.11  9.30  9.18   Sodium 135 - 145 mmol/L 135  135  136   Potassium 3.5 - 5.1 mmol/L 4.1  4.4  3.5   Chloride 98 - 111 mmol/L 101  102  104   CO2 22 - 32 mmol/L 27  27  26    Calcium  8.9 - 10.3 mg/dL 9.0  9.2  8.7   Total Protein 6.5 - 8.1 g/dL 7.1  6.9  6.6   Total Bilirubin 0.0 - 1.2 mg/dL 0.3  0.3  0.3   Alkaline Phos 38 - 126 U/L 74  69  64   AST 15 - 41 U/L 28  25  22    ALT 0 - 44 U/L 15  12  10      Lab Results  Component Value Date   WBC 3.0 (L) 03/30/2024   HGB 9.1 (L) 03/30/2024   HCT 28.8 (L) 03/30/2024   MCV 84.2 03/30/2024   PLT 362 03/30/2024   NEUTROABS 2.1 03/30/2024    ASSESSMENT & PLAN:  Iron  deficiency anemia Discovered during her hospitalization in August 2022 (she was hospitalized for severe UTIs) hemoglobin was 6.9 MCV 72 (1 unit of PRBC and IV iron  x1 dose) Upper endoscopy and colonoscopy performed November 2022: No evidence of bleeding   IV Iron : Dec 2022, Mar 2023, October 2023, May 2024, October 2024, March 2025, June 2025, August 2025, October 2025   Lab Review: 08/02/21: WBC: 3.5, Hb 11.3, Ferritin: 20, Iron  Sat: 7%, TIBC: 553 11/14/21: Ferritin 103, Hb 12.3, Sat: 11% 02/19/2022: Ferritin 21, iron  saturation 7%, TIBC 545, B12 739, hemoglobin 10.6, folate 13.5 06/25/22: Hb 10.9, MCV 82, Iron  Sat 8%, TIBC 487, Ferritin 33 10/17/2022: Hemoglobin 7.4, MCV 68.5, RDW 16, ferritin 6, iron  saturation 3%, TIBC 633 02/05/2023: Hemoglobin 8.6, MCV 71.2, iron   saturation 4%, TIBC 596, ferritin 9 05/08/2023: Hemoglobin 10.9, MCV 81.7, WBC 3.2, iron  saturation 11%, ferritin 75 08/12/2023: Hemoglobin 8.6, MCV 74.3, iron  saturation 3%, TIBC 615, ferritin 8 11/01/2023: Hemoglobin 8.5, MCV 75.6, iron  saturation 4%, ferritin 23 12/05/23: Hemoglobin: 7.3, MCV 83.6, WBC: 1.8, ANC 0.8, Iron  sat: 6%, Ferritin 140 12/22/2023: Hemoglobin 6.5, MCV 75.1, WBC 1.9, ANC 1.1, platelets  246, reticulocyte hemoglobin 15.5, reticulocyte percent: 2.7%, Cr 0.81, Ferritin 11.9, B 12 861 02/05/2024: Iron  saturation 5%, TIBC 456, hemoglobin 7.2, ferritin pending, ANC 1.1, WBC 1.9 03/11/2024: Hemoglobin 6.6, MCV 74.7 (patient is reporting blood in the stool) 03/30/2024: Hemoglobin 9.1, MCV 84.2, LDH 212, iron  saturation 22%, haptoglobin 93, ferritin 627   03/02/2024: Bone marrow biopsy: Cellular bone marrow with trilineage hematopoiesis, cytogenetics 46XX normal 03/23/2024: CT CAP: No evidence of occult malignancy.  Cholelithiasis, chronic gastric wall thickening   Recommendation: No need of additional IV iron  infusions at this time. Consult Dr. Elicia for endoscopy and colonoscopy for the GI blood loss  I recommended discontinuation of aspirin  therapy Her daughter believes that avocado may be causing her to have bleeding episodes.  Return to clinic in 1 month with labs and follow-up ------------------------------------- Assessment and Plan Assessment & Plan Iron  deficiency anemia Hemoglobin improved to 9.1, still below normal. Recent transfusion and iron  supplementation administered. Bleeding stopped after discontinuing avocado and aspirin . - Monitor hemoglobin levels biweekly. - Schedule follow-up blood work in two weeks to check B12 and folate levels. - Discuss aspirin  discontinuation with her cardiologist.  Leukopenia White blood cell count improved to 3, still below normal range. Improvement noted without specific intervention. - Monitor white blood cell count with  regular blood work.  Gastritis CT scan shows stomach irritation consistent with gastritis. No current bleeding reported after dietary changes. - Proceed with scheduled gastroenterology appointment on November 12. - Undergo colonoscopy as planned to further investigate gastrointestinal bleeding.  Pulmonary and cardiac findings CT scan shows scarring at lung bases, possibly due to secondhand smoke exposure. Heart enlargement noted. - Consult with cardiologist regarding heart enlargement and potential need for echocardiogram.      Orders Placed This Encounter  Procedures   CBC with Differential ([facility]cer Center Only)    Standing Status:   Future    Expiration Date:   04/01/2025   Ferritin    Standing Status:   Future    Expiration Date:   04/01/2025   Iron  and Iron  Binding Capacity ([facility] only)    Standing Status:   Future    Expiration Date:   04/01/2025   Vitamin B12    Standing Status:   Future    Expiration Date:   05/06/2025   Folate    Standing Status:   Future    Expiration Date:   04/01/2025   Sample to Blood Bank    Standing Status:   Future    Expiration Date:   04/01/2025   The patient has a good understanding of the overall plan. she agrees with it. she will call with any problems that may develop before the next visit here.  I personally spent a total of 30 minutes in the care of the patient today including preparing to see the patient, getting/reviewing separately obtained history, performing a medically appropriate exam/evaluation, counseling and educating, placing orders, referring and communicating with other health care professionals, documenting clinical information in the EHR, independently interpreting results, communicating results, and coordinating care.   Viinay K Oluwaferanmi Wain, MD 04/01/24

## 2024-04-08 ENCOUNTER — Encounter: Payer: Self-pay | Admitting: Gastroenterology

## 2024-04-08 ENCOUNTER — Ambulatory Visit (AMBULATORY_SURGERY_CENTER): Admitting: *Deleted

## 2024-04-08 VITALS — Ht 60.0 in | Wt 145.0 lb

## 2024-04-08 DIAGNOSIS — D509 Iron deficiency anemia, unspecified: Secondary | ICD-10-CM

## 2024-04-08 DIAGNOSIS — Z1211 Encounter for screening for malignant neoplasm of colon: Secondary | ICD-10-CM

## 2024-04-08 MED ORDER — NA SULFATE-K SULFATE-MG SULF 17.5-3.13-1.6 GM/177ML PO SOLN: 1.0000 | Freq: Once | ORAL | 0 refills | Status: AC

## 2024-04-08 NOTE — Progress Notes (Signed)
 Pt's name and DOB verified at the beginning of the pre-visit with 2 identifiers   Permission given to speak with   Pt denies any difficulty with ambulating,sitting, laying down or rolling side to side   Pt has no issues moving head neck or swallowing   No egg or soy allergy known to patient    HX of PONV    No FH of Malignant Hyperthermia   Pt is not on home 02    Pt is not on blood thinners    Pt denies issues with constipation    Pt is not on dialysis   Pt hx of heart murmur but denise any abnormal heart rhythms    Pt denies any upcoming cardiac testing   Patient's chart reviewed by Norleen Schillings CNRA prior to pre-visit and patient appropriate for the [facility].  Pre-visit completed and red dot placed by patient's name on their procedure day (on provider's schedule).       Visit in person   Pt states weight is 145 lb   Pt given  both [facility] main # and MD on call # prior to instructions.  Informed pt to come in at the time discussed and is shown on PV instructions.  Pt instructed to use [employer] or GoodRx for a price reduction on prep  Instructed pt where to find PV instructions in My Ch. Copy of instructions  to be sent in mail and address read back to pt to verify correct on envelope. Instructed pt on all aspects of written instructions including med holds clothing to wear and foods to eat and not eat as well as after procedure legal restrictions and to call MD on call if needed.. Pt states understanding. Instructed pt to review instructions again prior to procedure and call main # given if has any questions or any issues. Pt states they will.

## 2024-04-13 ENCOUNTER — Inpatient Hospital Stay: Attending: Hematology and Oncology

## 2024-04-13 DIAGNOSIS — D72819 Decreased white blood cell count, unspecified: Secondary | ICD-10-CM | POA: Diagnosis not present

## 2024-04-13 DIAGNOSIS — I517 Cardiomegaly: Secondary | ICD-10-CM | POA: Diagnosis not present

## 2024-04-13 DIAGNOSIS — R918 Other nonspecific abnormal finding of lung field: Secondary | ICD-10-CM | POA: Diagnosis not present

## 2024-04-13 DIAGNOSIS — D509 Iron deficiency anemia, unspecified: Secondary | ICD-10-CM | POA: Insufficient documentation

## 2024-04-13 DIAGNOSIS — K2970 Gastritis, unspecified, without bleeding: Secondary | ICD-10-CM | POA: Insufficient documentation

## 2024-04-13 LAB — IRON AND IRON BINDING CAPACITY (CC-WL,HP ONLY)
Iron: 49 ug/dL (ref 28–170)
Saturation Ratios: 11 % (ref 10.4–31.8)
TIBC: 448 ug/dL (ref 250–450)
UIBC: 399 ug/dL (ref 148–442)

## 2024-04-13 LAB — CMP (CANCER CENTER ONLY)
ALT: 15 U/L (ref 0–44)
AST: 28 U/L (ref 15–41)
Albumin: 4.1 g/dL (ref 3.5–5.0)
Alkaline Phosphatase: 75 U/L (ref 38–126)
Anion gap: 8 (ref 5–15)
BUN: 18 mg/dL (ref 8–23)
CO2: 24 mmol/L (ref 22–32)
Calcium: 9.1 mg/dL (ref 8.9–10.3)
Chloride: 103 mmol/L (ref 98–111)
Creatinine: 0.87 mg/dL (ref 0.44–1.00)
GFR, Estimated: >60 mL/min (ref >60)
Glucose, Bld: 119 mg/dL — ABNORMAL HIGH (ref 70–99)
Potassium: 4.2 mmol/L (ref 3.5–5.1)
Sodium: 135 mmol/L (ref 135–145)
Total Bilirubin: 0.3 mg/dL (ref 0.0–1.2)
Total Protein: 6.9 g/dL (ref 6.5–8.1)

## 2024-04-13 LAB — CBC WITH DIFFERENTIAL (CANCER CENTER ONLY)
Abs Immature Granulocytes: 0.01 K/uL (ref 0.00–0.07)
Basophils Absolute: 0 K/uL (ref 0.0–0.1)
Basophils Relative: 2 %
Eosinophils Absolute: 0.1 K/uL (ref 0.0–0.5)
Eosinophils Relative: 3 %
HCT: 33.4 % — ABNORMAL LOW (ref 36.0–46.0)
Hemoglobin: 10.8 g/dL — ABNORMAL LOW (ref 12.0–15.0)
Immature Granulocytes: 0 %
Lymphocytes Relative: 20 %
Lymphs Abs: 0.5 K/uL — ABNORMAL LOW (ref 0.7–4.0)
MCH: 27.1 pg (ref 26.0–34.0)
MCHC: 32.3 g/dL (ref 30.0–36.0)
MCV: 83.7 fL (ref 80.0–100.0)
Monocytes Absolute: 0.2 K/uL (ref 0.1–1.0)
Monocytes Relative: 9 %
Neutro Abs: 1.8 K/uL (ref 1.7–7.7)
Neutrophils Relative %: 66 %
Platelet Count: 232 K/uL (ref 150–400)
RBC: 3.99 MIL/uL (ref 3.87–5.11)
RDW: 21.9 % — ABNORMAL HIGH (ref 11.5–15.5)
WBC Count: 2.6 K/uL — ABNORMAL LOW (ref 4.0–10.5)
nRBC: 0 % (ref 0.0–0.2)

## 2024-04-13 LAB — FOLATE: Folate: >20 ng/mL (ref >5.9)

## 2024-04-13 LAB — DIRECT ANTIGLOBULIN TEST (NOT AT ARMC)
DAT, IgG: NEGATIVE
DAT, complement: NEGATIVE

## 2024-04-13 LAB — FERRITIN: Ferritin: 199 ng/mL (ref 11–307)

## 2024-04-13 LAB — VITAMIN B12: Vitamin B-12: 730 pg/mL (ref 180–914)

## 2024-04-13 NOTE — Assessment & Plan Note (Signed)
 Discovered during her hospitalization in August 2022 (she was hospitalized for severe UTIs) hemoglobin was 6.9 MCV 72 (1 unit of PRBC and IV iron  x1 dose) Upper endoscopy and colonoscopy performed November 2022: No evidence of bleeding   IV Iron : Dec 2022, Mar 2023, October 2023, May 2024, October 2024, March 2025, June 2025, August 2025, October 2025   Lab Review: 08/02/21: WBC: 3.5, Hb 11.3, Ferritin: 20, Iron  Sat: 7%, TIBC: 553 11/14/21: Ferritin 103, Hb 12.3, Sat: 11% 02/19/2022: Ferritin 21, iron  saturation 7%, TIBC 545, B12 739, hemoglobin 10.6, folate 13.5 06/25/22: Hb 10.9, MCV 82, Iron  Sat 8%, TIBC 487, Ferritin 33 10/17/2022: Hemoglobin 7.4, MCV 68.5, RDW 16, ferritin 6, iron  saturation 3%, TIBC 633 02/05/2023: Hemoglobin 8.6, MCV 71.2, iron  saturation 4%, TIBC 596, ferritin 9 05/08/2023: Hemoglobin 10.9, MCV 81.7, WBC 3.2, iron  saturation 11%, ferritin 75 08/12/2023: Hemoglobin 8.6, MCV 74.3, iron  saturation 3%, TIBC 615, ferritin 8 11/01/2023: Hemoglobin 8.5, MCV 75.6, iron  saturation 4%, ferritin 23 12/05/23: Hemoglobin: 7.3, MCV 83.6, WBC: 1.8, ANC 0.8, Iron  sat: 6%, Ferritin 140 12/22/2023: Hemoglobin 6.5, MCV 75.1, WBC 1.9, ANC 1.1, platelets 246, reticulocyte hemoglobin 15.5, reticulocyte percent: 2.7%, Cr 0.81, Ferritin 11.9, B 12 861 02/05/2024: Iron  saturation 5%, TIBC 456, hemoglobin 7.2, ferritin pending, ANC 1.1, WBC 1.9 03/11/2024: Hemoglobin 6.6, MCV 74.7 (patient is reporting blood in the stool) 03/30/2024: Hemoglobin 9.1, MCV 84.2, LDH 212, iron  saturation 22%, haptoglobin 93, ferritin 627   03/02/2024: Bone marrow biopsy: Cellular bone marrow with trilineage hematopoiesis, cytogenetics 46XX normal 03/23/2024: CT CAP: No evidence of occult malignancy.  Cholelithiasis, chronic gastric wall thickening    Recommendation: No need of additional IV iron  infusions at this time. Consult Dr. Elicia for endoscopy and colonoscopy for the GI blood loss   I recommended discontinuation of  aspirin  therapy Her daughter believes that avocado may be causing her to have bleeding episodes.   Return to clinic in 1 month with labs and follow-up

## 2024-04-14 ENCOUNTER — Inpatient Hospital Stay: Admitting: Hematology and Oncology

## 2024-04-14 DIAGNOSIS — D509 Iron deficiency anemia, unspecified: Secondary | ICD-10-CM | POA: Diagnosis not present

## 2024-04-14 NOTE — Progress Notes (Signed)
 HEMATOLOGY-ONCOLOGY TELEPHONE VISIT PROGRESS NOTE  I connected with our patient on 04/14/24 at  9:00 AM EST by telephone and verified that I am speaking with the correct person using two identifiers.  I discussed the limitations, risks, security and privacy concerns of performing an evaluation and management service by telephone and the availability of in person appointments.  I also discussed with the patient that there may be a patient responsible charge related to this service. The patient expressed understanding and agreed to proceed.   History of Present Illness: Follow-up of iron  deficiency anemia  History of Present Illness Shirley Juarez is a 73 year old female who presents for follow-up of her hemoglobin levels.  Her hemoglobin has improved to 10.8 from 6.6 a month ago. She has discontinued aspirin  and avocado consumption. A colonoscopy is scheduled for tomorrow.  Ferritin is at 199, indicating adequate iron  stores, but iron  saturation is low at 11%. She has no cravings for ice chips and has not taken iron  supplements since October.  She does not experience fatigue or sluggishness, symptoms previously linked to low hemoglobin levels.  REVIEW OF SYSTEMS:   Constitutional: Denies fevers, chills or abnormal weight loss All other systems were reviewed with the patient and are negative. Observations/Objective:     Assessment Plan:  Iron  deficiency anemia Discovered during her hospitalization in August 2022 (she was hospitalized for severe UTIs) hemoglobin was 6.9 MCV 72 (1 unit of PRBC and IV iron  x1 dose) Upper endoscopy and colonoscopy performed November 2022: No evidence of bleeding   IV Iron : Dec 2022, Mar 2023, October 2023, May 2024, October 2024, March 2025, June 2025, August 2025, October 2025   Lab Review: 08/02/21: WBC: 3.5, Hb 11.3, Ferritin: 20, Iron  Sat: 7%, TIBC: 553 11/14/21: Ferritin 103, Hb 12.3, Sat: 11% 02/19/2022: Ferritin 21, iron  saturation 7%, TIBC  545, B12 739, hemoglobin 10.6, folate 13.5 06/25/22: Hb 10.9, MCV 82, Iron  Sat 8%, TIBC 487, Ferritin 33 10/17/2022: Hemoglobin 7.4, MCV 68.5, RDW 16, ferritin 6, iron  saturation 3%, TIBC 633 02/05/2023: Hemoglobin 8.6, MCV 71.2, iron  saturation 4%, TIBC 596, ferritin 9 05/08/2023: Hemoglobin 10.9, MCV 81.7, WBC 3.2, iron  saturation 11%, ferritin 75 08/12/2023: Hemoglobin 8.6, MCV 74.3, iron  saturation 3%, TIBC 615, ferritin 8 11/01/2023: Hemoglobin 8.5, MCV 75.6, iron  saturation 4%, ferritin 23 12/05/23: Hemoglobin: 7.3, MCV 83.6, WBC: 1.8, ANC 0.8, Iron  sat: 6%, Ferritin 140 12/22/2023: Hemoglobin 6.5, MCV 75.1, WBC 1.9, ANC 1.1, platelets 246, reticulocyte hemoglobin 15.5, reticulocyte percent: 2.7%, Cr 0.81, Ferritin 11.9, B 12 861 02/05/2024: Iron  saturation 5%, TIBC 456, hemoglobin 7.2, ferritin pending, ANC 1.1, WBC 1.9 03/11/2024: Hemoglobin 6.6, MCV 74.7 (patient is reporting blood in the stool) 03/30/2024: Hemoglobin 9.1, MCV 84.2, LDH 212, iron  saturation 22%, haptoglobin 93, ferritin 627 04/13/2024: Hemoglobin 10.8, MCV 83.7, ferritin 199, iron  saturation 11%, B12 730, DAT negative     03/02/2024: Bone marrow biopsy: Cellular bone marrow with trilineage hematopoiesis, cytogenetics 46XX normal 03/23/2024: CT CAP: No evidence of occult malignancy.  Cholelithiasis, chronic gastric wall thickening    Recommendation: No need of additional IV iron  infusions at this time. Dr. Elicia for endoscopy and colonoscopy for the GI blood loss: 04/15/2024   Discontinued aspirin  therapy Her daughter believes that avocado may be causing her to have bleeding episodes.   Return to clinic in 1 month with labs and follow-up      I discussed the assessment and treatment plan with the patient. The patient was provided an opportunity to ask  questions and all were answered. The patient agreed with the plan and demonstrated an understanding of the instructions. The patient was advised to call back or seek an  in-person evaluation if the symptoms worsen or if the condition fails to improve as anticipated.   I provided 20 minutes of non-face-to-face time during this encounter.  This includes time for charting and coordination of care   Naomi MARLA Chad, MD

## 2024-04-15 ENCOUNTER — Encounter: Payer: Self-pay | Admitting: Gastroenterology

## 2024-04-15 ENCOUNTER — Other Ambulatory Visit (HOSPITAL_COMMUNITY): Payer: Self-pay

## 2024-04-15 ENCOUNTER — Ambulatory Visit (AMBULATORY_SURGERY_CENTER): Admitting: Gastroenterology

## 2024-04-15 ENCOUNTER — Telehealth: Payer: Self-pay

## 2024-04-15 VITALS — BP 136/70 | HR 101 | Temp 98.2°F | Resp 16 | Ht 60.0 in | Wt 145.0 lb

## 2024-04-15 DIAGNOSIS — D123 Benign neoplasm of transverse colon: Secondary | ICD-10-CM

## 2024-04-15 DIAGNOSIS — K449 Diaphragmatic hernia without obstruction or gangrene: Secondary | ICD-10-CM | POA: Diagnosis not present

## 2024-04-15 DIAGNOSIS — Z1211 Encounter for screening for malignant neoplasm of colon: Secondary | ICD-10-CM

## 2024-04-15 DIAGNOSIS — K3189 Other diseases of stomach and duodenum: Secondary | ICD-10-CM

## 2024-04-15 DIAGNOSIS — K31819 Angiodysplasia of stomach and duodenum without bleeding: Secondary | ICD-10-CM | POA: Diagnosis not present

## 2024-04-15 DIAGNOSIS — K5730 Diverticulosis of large intestine without perforation or abscess without bleeding: Secondary | ICD-10-CM

## 2024-04-15 DIAGNOSIS — D509 Iron deficiency anemia, unspecified: Secondary | ICD-10-CM | POA: Diagnosis not present

## 2024-04-15 DIAGNOSIS — K649 Unspecified hemorrhoids: Secondary | ICD-10-CM

## 2024-04-15 DIAGNOSIS — K641 Second degree hemorrhoids: Secondary | ICD-10-CM | POA: Diagnosis not present

## 2024-04-15 DIAGNOSIS — D124 Benign neoplasm of descending colon: Secondary | ICD-10-CM | POA: Diagnosis not present

## 2024-04-15 DIAGNOSIS — K635 Polyp of colon: Secondary | ICD-10-CM | POA: Diagnosis not present

## 2024-04-15 DIAGNOSIS — K5520 Angiodysplasia of colon without hemorrhage: Secondary | ICD-10-CM | POA: Diagnosis not present

## 2024-04-15 DIAGNOSIS — K319 Disease of stomach and duodenum, unspecified: Secondary | ICD-10-CM | POA: Diagnosis not present

## 2024-04-15 DIAGNOSIS — K2289 Other specified disease of esophagus: Secondary | ICD-10-CM

## 2024-04-15 DIAGNOSIS — D125 Benign neoplasm of sigmoid colon: Secondary | ICD-10-CM

## 2024-04-15 MED ORDER — PANTOPRAZOLE SODIUM 40 MG PO TBEC: 40.0000 mg | DELAYED_RELEASE_TABLET | Freq: Every day | ORAL | 3 refills | Status: AC

## 2024-04-15 MED ORDER — SODIUM CHLORIDE 0.9 % IV SOLN: 500.0000 mL | INTRAVENOUS | Status: AC

## 2024-04-15 NOTE — Progress Notes (Signed)
1032 Robinul 0.1 mg IV given due large amount of secretions upon assessment.  MD made aware, vss

## 2024-04-15 NOTE — Progress Notes (Signed)
 1050 EKG showing small runs of a fib, MD aware, vss

## 2024-04-15 NOTE — Patient Instructions (Addendum)
 YOU HAD AN ENDOSCOPIC PROCEDURE TODAY AT THE [facility]:   Refer to the procedure report that was given to you for any specific questions about what was found during the examination.  If the procedure report does not answer your questions, please call your gastroenterologist to clarify.  If you requested that your care partner not be given the details of your procedure findings, then the procedure report has been included in a sealed envelope for you to review at your convenience later.  YOU SHOULD EXPECT: Some feelings of bloating in the abdomen. Passage of more gas than usual.  Walking can help get rid of the air that was put into your GI tract during the procedure and reduce the bloating. If you had a lower endoscopy (such as a colonoscopy or flexible sigmoidoscopy) you may notice spotting of blood in your stool or on the toilet paper. If you underwent a bowel prep for your procedure, you may not have a normal bowel movement for a few days.  Please Note:  You might notice some irritation and congestion in your nose or some drainage.  This is from the oxygen used during your procedure.  There is no need for concern and it should clear up in a day or so.  SYMPTOMS TO REPORT IMMEDIATELY:  Following lower endoscopy (colonoscopy or flexible sigmoidoscopy):  Excessive amounts of blood in the stool  Significant tenderness or worsening of abdominal pains  Swelling of the abdomen that is new, acute  Fever of 100F or higher  Following upper endoscopy (EGD)  Vomiting of blood or coffee ground material  New chest pain or pain under the shoulder blades  Painful or persistently difficult swallowing  New shortness of breath  Fever of 100F or higher  Black, tarry-looking stools  High fiber diet Continue present medications Await pathology results Repeat colonoscopy in 3-5 years for surveillance Recommend Enteroscopy + Colonoscopy in hospital-based outpatient setting for next steps in  treatment of the multiple AVM's noted on her GI tract evaluation.  Start Protonix  - 40 mg daily Await pathology results Handouts on diverticulosis, hemorrhoids and polyps given  For urgent or emergent issues, a gastroenterologist can be reached at any hour by calling [phone]. Do not use MyChart messaging for urgent concerns.    DIET:  We do recommend a small meal at first, but then you may proceed to your regular diet.  Drink plenty of fluids but you should avoid alcoholic beverages for 24 hours.  ACTIVITY:  You should plan to take it easy for the rest of today and you should NOT DRIVE or use heavy machinery until tomorrow (because of the sedation medicines used during the test).    FOLLOW UP: Our staff will call the number listed on your records the next business day following your procedure.  We will call around 7:15- 8:00 am to check on you and address any questions or concerns that you may have regarding the information given to you following your procedure. If we do not reach you, we will leave a message.     If any biopsies were taken you will be contacted by phone or by letter within the next 1-3 weeks.  Please call us  at [phone] if you have not heard about the biopsies in 3 weeks.    SIGNATURES/CONFIDENTIALITY: You and/or your care partner have signed paperwork which will be entered into your electronic medical record.  These signatures attest to the fact that that the information above on  your After Visit Summary has been reviewed and is understood.  Full responsibility of the confidentiality of this discharge information lies with you and/or your care-partner.

## 2024-04-15 NOTE — Progress Notes (Signed)
 Called to room to assist during endoscopic procedure.  Patient ID and intended procedure confirmed with present staff. Received instructions for my participation in the procedure from the performing physician.

## 2024-04-15 NOTE — Telephone Encounter (Signed)
 Noted pt. advised

## 2024-04-15 NOTE — Progress Notes (Signed)
 1035 Patient experiencing nausea.  MD updated and Zofran  4 mg IV given, vss

## 2024-04-15 NOTE — Progress Notes (Signed)
 GASTROENTEROLOGY PROCEDURE H&P NOTE   Primary Care Physician: Lynwood Laneta ORN, PA-C  HPI: Shirley Juarez is a 73 y.o. female who presents for EGD/Colonoscopy for evaluation of IDA.  Past Medical History:  Diagnosis Date   Anxiety    Arthritis    Osteoarthritis- knee and back   CKD (chronic kidney disease), stage III (HCC)    Coronary artery disease    Cath 2004 in [place], [place] with minor CAD   Depression    Diabetes mellitus    On oral and diet   GERD (gastroesophageal reflux disease)    Heart murmur    Hyperlipidemia    Hypertension    Leaky heart valve    Osteopenia    PONV (postoperative nausea and vomiting)    Sleep apnea    does not use CPAP- it is broken.  Last sleep study was 7 years ago.  Has not worn in  4  years   Past Surgical History:  Procedure Laterality Date   ABDOMINAL HYSTERECTOMY     COLONOSCOPY     Dr.Mann   IR BONE MARROW BIOPSY & ASPIRATION  03/02/2024   KNEE ARTHROCENTESIS     TOTAL KNEE ARTHROPLASTY  06/22/2011   Bilateral    Current Outpatient Medications  Medication Sig Dispense Refill   acetaminophen  (TYLENOL ) 325 MG tablet Take 325-650 mg by mouth every 6 (six) hours as needed for mild pain or headache.     ALPRAZolam  (XANAX ) 0.5 MG tablet Take 0.5 mg by mouth in the morning and at bedtime.     amoxicillin  (AMOXIL ) 500 MG capsule amoxicillin  500 mg capsule  TAKE 4 CAPSULES BY MOUTH 1 HOUR PRIOR TO DENTAL APPOINTMENT THEN 2 CAPSULES BY MOUTH AFTER APPOINTMENT (Patient not taking: Reported on 04/08/2024)     Apremilast  (OTEZLA ) 30 MG TABS Take 1 tablet (30 mg total) by mouth 2 (two) times daily. 60 tablet 5   aspirin  EC 81 MG tablet Take 81 mg by mouth at bedtime.   (Patient not taking: Reported on 04/08/2024)     Blood Glucose Monitoring Suppl (ONETOUCH VERIO) w/Device KIT use to test your blood sugar     Calcium  Citrate-Vitamin D  (CALCIUM  CITRATE + D3 PO) Take 1 tablet by mouth every Monday, Wednesday, and Friday.     cetirizine (ZYRTEC) 5  MG tablet Take 5 mg by mouth daily.     clobetasol  (TEMOVATE ) 0.05 % external solution Apply once or twice daily to affected areas on scalp as needed for scaling/itching. Avoid applying to face, groin, and axilla. 50 mL 3   docusate sodium (COLACE) 100 MG capsule Take 100 mg by mouth daily.     gemfibrozil  (LOPID ) 600 MG tablet Take by mouth.     glucose blood (ONETOUCH VERIO) test strip use to test your blood sugar     hydrALAZINE  (APRESOLINE ) 100 MG tablet TAKE 1 TABLET BY MOUTH 3 TIMES A DAY 270 tablet 1   hydrOXYzine  (ATARAX /VISTARIL ) 25 MG tablet Take 25 mg by mouth at bedtime.       ketoconazole (NIZORAL) 2 % shampoo Apply 1 Application topically 2 (two) times a week.     lansoprazole  (PREVACID ) 30 MG capsule TAKE 1 CAPSULE BY MOUTH TWICE A DAY BEFORE MEALS 60 capsule 4   metFORMIN  (GLUCOPHAGE ) 500 MG tablet Take 1,000 mg by mouth daily with breakfast. (Patient not taking: Reported on 04/08/2024)     ondansetron  (ZOFRAN ) 4 MG tablet Take 1 tablet (4 mg total) by mouth as directed. Take  one Zofran  pill 30-60 minutes before each colonoscopy prep dose 2 tablet 0   tamsulosin  (FLOMAX ) 0.4 MG CAPS capsule Take 1 capsule (0.4 mg total) by mouth daily after supper. 30 capsule 2   venlafaxine  XR (EFFEXOR -XR) 150 MG 24 hr capsule Take 150 mg by mouth at bedtime.     Current Facility-Administered Medications  Medication Dose Route Frequency Provider Last Rate Last Admin   0.9 %  sodium chloride  infusion  500 mL Intravenous Once Mansouraty, Korissa Horsford Jr., MD       0.9 %  sodium chloride  infusion  500 mL Intravenous Continuous Mansouraty, Harriet Bollen Jr., MD        Current Outpatient Medications:    acetaminophen  (TYLENOL ) 325 MG tablet, Take 325-650 mg by mouth every 6 (six) hours as needed for mild pain or headache., Disp: , Rfl:    ALPRAZolam  (XANAX ) 0.5 MG tablet, Take 0.5 mg by mouth in the morning and at bedtime., Disp: , Rfl:    amoxicillin  (AMOXIL ) 500 MG capsule, amoxicillin  500 mg capsule  TAKE 4  CAPSULES BY MOUTH 1 HOUR PRIOR TO DENTAL APPOINTMENT THEN 2 CAPSULES BY MOUTH AFTER APPOINTMENT (Patient not taking: Reported on 04/08/2024), Disp: , Rfl:    Apremilast  (OTEZLA ) 30 MG TABS, Take 1 tablet (30 mg total) by mouth 2 (two) times daily., Disp: 60 tablet, Rfl: 5   aspirin  EC 81 MG tablet, Take 81 mg by mouth at bedtime.   (Patient not taking: Reported on 04/08/2024), Disp: , Rfl:    Blood Glucose Monitoring Suppl (ONETOUCH VERIO) w/Device KIT, use to test your blood sugar, Disp: , Rfl:    Calcium  Citrate-Vitamin D  (CALCIUM  CITRATE + D3 PO), Take 1 tablet by mouth every Monday, Wednesday, and Friday., Disp: , Rfl:    cetirizine (ZYRTEC) 5 MG tablet, Take 5 mg by mouth daily., Disp: , Rfl:    clobetasol  (TEMOVATE ) 0.05 % external solution, Apply once or twice daily to affected areas on scalp as needed for scaling/itching. Avoid applying to face, groin, and axilla., Disp: 50 mL, Rfl: 3   docusate sodium (COLACE) 100 MG capsule, Take 100 mg by mouth daily., Disp: , Rfl:    gemfibrozil  (LOPID ) 600 MG tablet, Take by mouth., Disp: , Rfl:    glucose blood (ONETOUCH VERIO) test strip, use to test your blood sugar, Disp: , Rfl:    hydrALAZINE  (APRESOLINE ) 100 MG tablet, TAKE 1 TABLET BY MOUTH 3 TIMES A DAY, Disp: 270 tablet, Rfl: 1   hydrOXYzine  (ATARAX /VISTARIL ) 25 MG tablet, Take 25 mg by mouth at bedtime.  , Disp: , Rfl:    ketoconazole (NIZORAL) 2 % shampoo, Apply 1 Application topically 2 (two) times a week., Disp: , Rfl:    lansoprazole  (PREVACID ) 30 MG capsule, TAKE 1 CAPSULE BY MOUTH TWICE A DAY BEFORE MEALS, Disp: 60 capsule, Rfl: 4   metFORMIN  (GLUCOPHAGE ) 500 MG tablet, Take 1,000 mg by mouth daily with breakfast. (Patient not taking: Reported on 04/08/2024), Disp: , Rfl:    ondansetron  (ZOFRAN ) 4 MG tablet, Take 1 tablet (4 mg total) by mouth as directed. Take one Zofran  pill 30-60 minutes before each colonoscopy prep dose, Disp: 2 tablet, Rfl: 0   tamsulosin  (FLOMAX ) 0.4 MG CAPS capsule,  Take 1 capsule (0.4 mg total) by mouth daily after supper., Disp: 30 capsule, Rfl: 2   venlafaxine  XR (EFFEXOR -XR) 150 MG 24 hr capsule, Take 150 mg by mouth at bedtime., Disp: , Rfl:   Current Facility-Administered Medications:    0.9 %  sodium chloride  infusion, 500 mL, Intravenous, Once, Mansouraty, Aloha Raddle., MD   0.9 %  sodium chloride  infusion, 500 mL, Intravenous, Continuous, Mansouraty, Aloha Raddle., MD Allergies  Allergen Reactions   Latex Rash   Codeine Nausea And Vomiting   Covid-19 (Mrna Bivalent) Vaccine Proofreader) [Covid-19 (Mrna) Vaccine] Hives   Crestor [Rosuvastatin Calcium ] Nausea And Vomiting   Ezetimibe  Nausea And Vomiting   Fish Oil Nausea And Vomiting   Hydralazine  Other (See Comments) and Cough    100 mg three times a day causes excessive coughing   Lipitor [Atorvastatin Calcium ] Nausea And Vomiting   Peanut-Containing Drug Products Hives   Statins Nausea And Vomiting   Welchol [Colesevelam Hcl] Nausea And Vomiting   Zithromax [Azithromycin] Nausea And Vomiting   Other Other (See Comments)    Pet dander   Family History  Problem Relation Age of Onset   Heart attack Mother 41   Liver disease Father    Heart attack Son        Age 8, he is our patient   Anesthesia problems Neg Hx    Colon cancer Neg Hx    Esophageal cancer Neg Hx    Stomach cancer Neg Hx    Colon polyps Neg Hx    Ulcerative colitis Neg Hx    Social History   Socioeconomic History   Marital status: Married    Spouse name: Not on file   Number of children: 2   Years of education: Not on file   Highest education level: Not on file  Occupational History   Occupation: Retired [employer] Manager [employer]  Tobacco Use   Smoking status: Never   Smokeless tobacco: Never  Vaping Use   Vaping status: Never Used  Substance and Sexual Activity   Alcohol use: No   Drug use: Not Currently   Sexual activity: Yes    Birth control/protection: Surgical  Other Topics Concern   Not on file  Social History  Narrative   Not on file   Social Drivers of Health   Financial Resource Strain: Not on file  Food Insecurity: Low Risk  (11/13/2023)   Received from [facility]   Hunger Vital Sign    Within the past 12 months, you worried that your food would run out before you got money to buy more: Never true    Within the past 12 months, the food you bought just didn't last and you didn't have money to get more. : Never true  Recent Concern: Food Insecurity - High Risk (11/06/2023)   Received from [facility]   Hunger Vital Sign    Within the past 12 months, you worried that your food would run out before you got money to buy more: Often true    Within the past 12 months, the food you bought just didn't last and you didn't have money to get more. : Sometimes true  Transportation Needs: No Transportation Needs (11/13/2023)   Received from [facility]    In the past 12 months, has lack of reliable transportation kept you from medical appointments, meetings, work or from getting things needed for daily living? : No  Physical Activity: Not on file  Stress: Not on file  Social Connections: Not on file  Intimate Partner Violence: Not on file    Physical Exam: There were no vitals filed for this visit. There is no height or weight on file to calculate BMI. GEN: NAD EYE: Sclerae anicteric ENT: MMM CV: Non-tachycardic GI: Soft,  NT/ND NEURO:  Alert & Oriented x 3  Lab Results: Recent Labs    04/13/24 1445  WBC 2.6*  HGB 10.8*  HCT 33.4*  PLT 232   BMET Recent Labs    04/13/24 1445  NA 135  K 4.2  CL 103  CO2 24  GLUCOSE 119*  BUN 18  CREATININE 0.87  CALCIUM  9.1   LFT Recent Labs    04/13/24 1445  PROT 6.9  ALBUMIN 4.1  AST 28  ALT 15  ALKPHOS 75  BILITOT 0.3   PT/INR No results for input(s): LABPROT, INR in the last 72 hours.   Impression / Plan: This is a 73 y.o.female who presents for EGD/Colonoscopy for evaluation of IDA.   The risks and  benefits of endoscopic evaluation/treatment were discussed with the patient and/or family; these include but are not limited to the risk of perforation, infection, bleeding, missed lesions, lack of diagnosis, severe illness requiring hospitalization, as well as anesthesia and sedation related illnesses.  The patient's history has been reviewed, patient examined, no change in status, and deemed stable for procedure.  The patient and/or family was provided an opportunity to ask questions and all were answered.  The patient and/or family is agreeable to proceed.    Aloha Finner, MD [facility] Office # [phone]

## 2024-04-15 NOTE — Op Note (Signed)
 [facility] Patient Name: Shirley Juarez Procedure Date: 04/15/2024 10:31 AM MRN: [id number] Endoscopist: Aloha Finner , MD, [id number] Age: 73 Referring MD:  Date of Birth: [birth year] Gender: Female Account #: [id number] Procedure:                Colonoscopy Indications:              Iron  deficiency anemia Medicines:                Monitored Anesthesia Care Procedure:                Pre-Anesthesia Assessment:                           - Prior to the procedure, a History and Physical                            was performed, and patient medications and                            allergies were reviewed. The patient's tolerance of                            previous anesthesia was also reviewed. The risks                            and benefits of the procedure and the sedation                            options and risks were discussed with the patient.                            All questions were answered, and informed consent                            was obtained. Prior Anticoagulants: The patient has                            taken no anticoagulant or antiplatelet agents                            except for aspirin . ASA Grade Assessment: III - A                            patient with severe systemic disease. After                            reviewing the risks and benefits, the patient was                            deemed in satisfactory condition to undergo the                            procedure.  After obtaining informed consent, the colonoscope                            was passed under direct vision. Throughout the                            procedure, the patient's blood pressure, pulse, and                            oxygen saturations were monitored continuously. The                            Olympus Scope SN [id number] was introduced through the                            anus and advanced to the the cecum, identified by                             appendiceal orifice and ileocecal valve. The                            colonoscopy was performed without difficulty. The                            patient tolerated the procedure. The quality of the                            bowel preparation was adequate. The ileocecal                            valve, appendiceal orifice, and rectum were                            photographed. Scope In: 10:50:16 AM Scope Out: 11:02:59 AM Scope Withdrawal Time: 0 hours 9 minutes 16 seconds  Total Procedure Duration: 0 hours 12 minutes 43 seconds  Findings:                 The digital rectal exam findings include                            hemorrhoids. Pertinent negatives include no                            palpable rectal lesions.                           At least seven small angioectasias with typical                            arborization were found in the ascending colon and                            in the cecum.  Four sessile polyps were found in the sigmoid colon                            (1), descending colon (1) and transverse colon (2).                            The polyps were 3 to 6 mm in size. These polyps                            were removed with a cold snare. Resection and                            retrieval were complete.                           Multiple small-mouthed diverticula were found in                            the recto-sigmoid colon and sigmoid colon.                           Normal mucosa was found in the entire colon                            otherwise.                           Non-bleeding non-thrombosed internal hemorrhoids                            were found during retroflexion, during perianal                            exam and during digital exam. The hemorrhoids were                            Grade II (internal hemorrhoids that prolapse but                            reduce  spontaneously). Complications:            No immediate complications. Estimated Blood Loss:     Estimated blood loss was minimal. Impression:               - Hemorrhoids found on digital rectal exam.                           - At least seven colonic angioectasias noted in the                            cecum/ascending colon.                           - Four 3 to 6 mm polyps in the sigmoid colon, in  the descending colon and in the transverse colon,                            removed with a cold snare. Resected and retrieved.                           - Diverticulosis in the recto-sigmoid colon and in                            the sigmoid colon.                           - Normal mucosa in the entire examined colon                            otherwise.                           - Non-bleeding non-thrombosed internal hemorrhoids. Recommendation:           - The patient will be observed post-procedure,                            until all discharge criteria are met.                           - Discharge patient to home.                           - Patient has a contact number available for                            emergencies. The signs and symptoms of potential                            delayed complications were discussed with the                            patient. Return to normal activities tomorrow.                            Written discharge instructions were provided to the                            patient.                           - High fiber diet.                           - Continue present medications.                           - Await pathology results.                           - Repeat colonoscopy in 3 -  5 years for                            surveillance.                           - Will plan to offer patient VCE to evaluate the                            small bowel further as she may have other AVMs                            noted  (since we found so many on the                            EGD/Colonoscopy today). If she defers, I think that                            is not unreasonable while she continues to have IV                            Iron  supplementation.                           - Recommend Enteroscopy + Colonoscopy in                            hospital-based outpatient setting for next steps in                            treatment of the multiple AVMs noted on her GI                            tract evaluation. These findings will explain                            significant issues with recurrent anemia of iron                             deficiency. Will need to be done in hospital-based                            setting as we do not have APC available in                            outpatient unit.                           - The findings and recommendations were discussed                            with the patient.                           - The findings  and recommendations were discussed                            with the patient's family. Aloha Finner, MD 04/15/2024 11:13:40 AM

## 2024-04-15 NOTE — Op Note (Addendum)
 [facility] Patient Name: Shirley Juarez Procedure Date: 04/15/2024 10:32 AM MRN: [id number] Endoscopist: Aloha Finner , MD, [id number] Age: 73 Referring MD:  Date of Birth: [birth year] Gender: Female Account #: [id number] Procedure:                Upper GI endoscopy Indications:              Iron  deficiency anemia Medicines:                Monitored Anesthesia Care Procedure:                Pre-Anesthesia Assessment:                           - Prior to the procedure, a History and Physical                            was performed, and patient medications and                            allergies were reviewed. The patient's tolerance of                            previous anesthesia was also reviewed. The risks                            and benefits of the procedure and the sedation                            options and risks were discussed with the patient.                            All questions were answered, and informed consent                            was obtained. Prior Anticoagulants: The patient has                            taken no anticoagulant or antiplatelet agents                            except for aspirin . ASA Grade Assessment: III - A                            patient with severe systemic disease. After                            reviewing the risks and benefits, the patient was                            deemed in satisfactory condition to undergo the                            procedure.  After obtaining informed consent, the endoscope was                            passed under direct vision. Throughout the                            procedure, the patient's blood pressure, pulse, and                            oxygen saturations were monitored continuously. The                            Olympus Scope [id number] was introduced through the                            mouth, and advanced to the second part of duodenum.                             The upper GI endoscopy was accomplished without                            difficulty. The patient tolerated the procedure. Scope In: Scope Out: Findings:                 No gross lesions were noted in the entire esophagus.                           The Z-line was irregular and was found 35 cm from                            the incisors.                           A 1 cm hiatal hernia was present.                           Multiple 2 to 10 mm semi-sessile polyps with no                            bleeding and no stigmata of recent bleeding were                            found in the cardia, in the gastric fundus and in                            the gastric body (fundic gland in appearance).                           Two small angioectasias with no bleeding were found                            in the gastric body.  Segmental moderate mucosal changes characterized by                            congestion, erythema and friability (with contact                            bleeding) were found in the entire examined                            stomach. Biopsies were taken with a cold forceps                            for histology and Helicobacter pylori testing.                           Three small angioectasias with typical arborization                            were found in the duodenal bulb and in the second                            portion of the duodenum.                           No other gross mucosal lesions were noted in the                            duodenal bulb, in the first portion of the duodenum                            and in the second portion of the duodenum. Biopsies                            for histology were taken with a cold forceps for                            evaluation of celiac disease. Complications:            No immediate complications. Estimated Blood Loss:     Estimated blood loss was  minimal. Impression:               - No gross lesions in the entire esophagus. Z-line                            irregular, 35 cm from the incisors.                           - 1 cm hiatal hernia.                           - Multiple gastric polyps (fundic gland in                            appearance).                           -  Two non-bleeding angioectasias in the stomach                            body.                           - Congested, erythematous and friable (with contact                            bleeding) mucosa in the stomach. Biopsied.                           - Three angioectasias in the duodenum (2 in bulb                            and 1 in 2nd portion).                           - No other gross mucosal lesions in the duodenal                            bulb, in the first portion of the duodenum and in                            the second portion of the duodenum. Biopsied. Recommendation:           - Proceed to scheduled colonoscopy.                           - Start Protonix  40 mg daily.                           - Observe patient's clinical course.                           - Await pathology results.                           - Plan for EGD v Enteroscopy to be scheduled in                            hospital-based setting for AVM ablation.                           - Further recommendations as per Colonoscopy report.                           - Continue present medications.                           - The findings and recommendations were discussed                            with the patient.                           -  The findings and recommendations were discussed                            with the patient's family. Aloha Finner, MD 04/15/2024 11:05:51 AM

## 2024-04-15 NOTE — Progress Notes (Signed)
1043 HR > 100 with esmolol 25 mg given IV, MD updated, vss

## 2024-04-15 NOTE — Progress Notes (Signed)
 Report given to PACU, vss

## 2024-04-15 NOTE — Telephone Encounter (Signed)
 Pharmacy Patient Advocate Encounter   Received notification from CoverMyMeds that prior authorization for Pantoprazole  Sodium 40MG  dr tablets is required/requested.   Insurance verification completed.   The patient is insured through [employer].   Prior Authorization for Pantoprazole  Sodium 40MG  dr tablets has been APPROVED from 04-15-2024 to 04-15-2025. Ran test claim, Copay is $11.93 for 90 day supply. This test claim was processed through [facility]- copay amounts may vary at other pharmacies due to pharmacy/plan contracts, or as the patient moves through the different stages of their insurance plan.   PA #/Case ID/Reference #: [id number]

## 2024-04-16 ENCOUNTER — Telehealth: Payer: Self-pay

## 2024-04-16 NOTE — Telephone Encounter (Signed)
  Follow up Call-     04/15/2024   10:14 AM  Call back number  Post procedure Call Back phone  # [phone]  Permission to leave phone message Yes     Patient questions:  Do you have a fever, pain , or abdominal swelling? No. Pain Score  0 *  Have you tolerated food without any problems? Yes.    Have you been able to return to your normal activities? Yes.    Do you have any questions about your discharge instructions: Diet   No. Medications  No. Follow up visit  No.  Do you have questions or concerns about your Care? No.  Actions: * If pain score is 4 or above: No action needed, pain <4.

## 2024-04-20 LAB — SURGICAL PATHOLOGY

## 2024-04-22 ENCOUNTER — Ambulatory Visit: Payer: Self-pay | Admitting: Gastroenterology

## 2024-04-23 ENCOUNTER — Other Ambulatory Visit: Payer: Self-pay

## 2024-04-23 DIAGNOSIS — Q2730 Arteriovenous malformation, site unspecified: Secondary | ICD-10-CM

## 2024-04-23 DIAGNOSIS — D509 Iron deficiency anemia, unspecified: Secondary | ICD-10-CM

## 2024-04-23 MED ORDER — NA SULFATE-K SULFATE-MG SULF 17.5-3.13-1.6 GM/177ML PO SOLN: 1.0000 | Freq: Once | ORAL | 0 refills | Status: AC

## 2024-04-23 NOTE — Progress Notes (Signed)
Colon/Endo scheduled, pt instructed and medications reviewed.  Patient instructions mailed to home.  Patient to call with any questions or concerns.  

## 2024-04-23 NOTE — Progress Notes (Signed)
 Colon Endo has been entered for 06/11/24 at 1030 am at [facility] with GM

## 2024-04-28 ENCOUNTER — Inpatient Hospital Stay

## 2024-04-29 ENCOUNTER — Inpatient Hospital Stay: Admitting: Hematology and Oncology

## 2024-05-04 ENCOUNTER — Other Ambulatory Visit: Payer: Self-pay | Admitting: Dermatology

## 2024-05-06 ENCOUNTER — Encounter

## 2024-05-12 ENCOUNTER — Inpatient Hospital Stay: Attending: Hematology and Oncology

## 2024-05-12 DIAGNOSIS — D72819 Decreased white blood cell count, unspecified: Secondary | ICD-10-CM | POA: Diagnosis present

## 2024-05-12 DIAGNOSIS — D509 Iron deficiency anemia, unspecified: Secondary | ICD-10-CM | POA: Insufficient documentation

## 2024-05-12 LAB — CBC WITH DIFFERENTIAL (CANCER CENTER ONLY)
Abs Immature Granulocytes: 0.01 K/uL (ref 0.00–0.07)
Basophils Absolute: 0 K/uL (ref 0.0–0.1)
Basophils Relative: 1 %
Eosinophils Absolute: 0.1 K/uL (ref 0.0–0.5)
Eosinophils Relative: 2 %
HCT: 31.7 % — ABNORMAL LOW (ref 36.0–46.0)
Hemoglobin: 10.3 g/dL — ABNORMAL LOW (ref 12.0–15.0)
Immature Granulocytes: 1 %
Lymphocytes Relative: 32 %
Lymphs Abs: 0.7 K/uL (ref 0.7–4.0)
MCH: 26.2 pg (ref 26.0–34.0)
MCHC: 32.5 g/dL (ref 30.0–36.0)
MCV: 80.7 fL (ref 80.0–100.0)
Monocytes Absolute: 0.3 K/uL (ref 0.1–1.0)
Monocytes Relative: 12 %
Neutro Abs: 1.1 K/uL — ABNORMAL LOW (ref 1.7–7.7)
Neutrophils Relative %: 52 %
Platelet Count: 257 K/uL (ref 150–400)
RBC: 3.93 MIL/uL (ref 3.87–5.11)
RDW: 18 % — ABNORMAL HIGH (ref 11.5–15.5)
WBC Count: 2.2 K/uL — ABNORMAL LOW (ref 4.0–10.5)
nRBC: 0 % (ref 0.0–0.2)

## 2024-05-12 LAB — IRON AND IRON BINDING CAPACITY (CC-WL,HP ONLY)
Iron: 30 ug/dL (ref 28–170)
Saturation Ratios: 6 % — ABNORMAL LOW (ref 10.4–31.8)
TIBC: 503 ug/dL — ABNORMAL HIGH (ref 250–450)
UIBC: 473 ug/dL

## 2024-05-12 LAB — SAMPLE TO BLOOD BANK

## 2024-05-12 LAB — FERRITIN: Ferritin: 39 ng/mL (ref 11–307)

## 2024-05-14 ENCOUNTER — Inpatient Hospital Stay: Admitting: Hematology and Oncology

## 2024-05-14 DIAGNOSIS — D72819 Decreased white blood cell count, unspecified: Secondary | ICD-10-CM | POA: Diagnosis not present

## 2024-05-14 DIAGNOSIS — D509 Iron deficiency anemia, unspecified: Secondary | ICD-10-CM

## 2024-05-14 NOTE — Progress Notes (Signed)
 HEMATOLOGY-ONCOLOGY TELEPHONE VISIT PROGRESS NOTE  I connected with our patient on 05/14/2024 at  9:00 AM EST by telephone and verified that I am speaking with the correct person using two identifiers.  I discussed the limitations, risks, security and privacy concerns of performing an evaluation and management service by telephone and the availability of in person appointments.  I also discussed with the patient that there may be a patient responsible charge related to this service. The patient expressed understanding and agreed to proceed.   History of Present Illness: Telephone follow-up of iron  deficiency anemia  History of Present Illness [place] Juarez Diane is a 73 year old female with chronic iron  deficiency anemia and persistent leukopenia who presents for hematology follow-up.  She has chronic iron  deficiency anemia with hemoglobin 9 g/dL prior to iron  supplementation in October, 10.8 g/dL in November, and 89.6 g/dL today. Iron  saturation has declined from 11% to 6%, with ferritin 39. She received iron  in October with transient hemoglobin improvement. She has ongoing but improved fatigue compared with prior more severe anemia and denies pica.  Recent endoscopy identified eight vascular lesions and four polyps that were removed. She is scheduled on January 8 for cauterization of the vascular lesions, currently planned as an outpatient procedure. She does not use aspirin  regularly, only rare baby aspirin  for intermittent chest discomfort, and none recently. She reports an enlarged right heart.  She has persistent leukopenia with a current white blood cell count of 2.2 x10^9/L. Prior bone marrow evaluation was non-diagnostic, and counts have remained stable without new symptoms. She understands additional bone marrow evaluation may be needed if counts decline.  She notes increased fatigue, which she partially attributes to caregiving demands for her husband after his recent hip  replacement.        REVIEW OF SYSTEMS:   Constitutional: Denies fevers, chills or abnormal weight loss All other systems were reviewed with the patient and are negative. Observations/Objective:     Assessment Plan:  Iron  deficiency anemia Discovered during her hospitalization in August 2022 (she was hospitalized for severe UTIs) hemoglobin was 6.9 MCV 72 (1 unit of PRBC and IV iron  x1 dose) Upper endoscopy and colonoscopy performed November 2022: No evidence of bleeding   IV Iron : Dec 2022, Mar 2023, October 2023, May 2024, October 2024, March 2025, June 2025, August 2025, October 2025   Lab Review: 08/02/21: WBC: 3.5, Hb 11.3, Ferritin: 20, Iron  Sat: 7%, TIBC: 553 11/14/21: Ferritin 103, Hb 12.3, Sat: 11% 02/19/2022: Ferritin 21, iron  saturation 7%, TIBC 545, B12 739, hemoglobin 10.6, folate 13.5 06/25/22: Hb 10.9, MCV 82, Iron  Sat 8%, TIBC 487, Ferritin 33 10/17/2022: Hemoglobin 7.4, MCV 68.5, RDW 16, ferritin 6, iron  saturation 3%, TIBC 633 02/05/2023: Hemoglobin 8.6, MCV 71.2, iron  saturation 4%, TIBC 596, ferritin 9 05/08/2023: Hemoglobin 10.9, MCV 81.7, WBC 3.2, iron  saturation 11%, ferritin 75 08/12/2023: Hemoglobin 8.6, MCV 74.3, iron  saturation 3%, TIBC 615, ferritin 8 11/01/2023: Hemoglobin 8.5, MCV 75.6, iron  saturation 4%, ferritin 23 12/05/23: Hemoglobin: 7.3, MCV 83.6, WBC: 1.8, ANC 0.8, Iron  sat: 6%, Ferritin 140 12/22/2023: Hemoglobin 6.5, MCV 75.1, WBC 1.9, ANC 1.1, platelets 246, reticulocyte hemoglobin 15.5, reticulocyte percent: 2.7%, Cr 0.81, Ferritin 11.9, B 12 861 02/05/2024: Iron  saturation 5%, TIBC 456, hemoglobin 7.2, ferritin pending, ANC 1.1, WBC 1.9 03/11/2024: Hemoglobin 6.6, MCV 74.7 (patient is reporting blood in the stool) 03/30/2024: Hemoglobin 9.1, MCV 84.2, LDH 212, iron  saturation 22%, haptoglobin 93, ferritin 627 04/13/2024: Hemoglobin 10.8, MCV 83.7, ferritin 199, iron  saturation 11%,  B12 730, DAT negative   05/12/2024: Hemoglobin 10.3, MCV 80.7, iron   saturation 6%, TIBC 503, ferritin 39   03/02/2024: Bone marrow biopsy: Cellular bone marrow with trilineage hematopoiesis, cytogenetics 46XX normal 03/23/2024: CT CAP: No evidence of occult malignancy.  Cholelithiasis, chronic gastric wall thickening    Recommendation: Recommended IV iron . Dr. Elicia for endoscopy and colonoscopy for the GI blood loss: 04/15/2024: 4 polyps removed, bleeding vessels noted, 06/11/24: plan is for cauterization   Takes aspirin  therapy once in a while Her daughter believes that avocado may be causing her to have bleeding episodes.    Return to clinic in 3 months with labs and telephone follow-up 2 days later   Assessment & Plan Chronic leukopenia Persistent leukopenia with stable white blood cell count and no identified etiology. - Continued observation of white blood cell count. - Discussed repeat bone marrow biopsy if leukopenia worsens. - Scheduled follow-up in three months to monitor hematologic parameters.      I discussed the assessment and treatment plan with the patient. The patient was provided an opportunity to ask questions and all were answered. The patient agreed with the plan and demonstrated an understanding of the instructions. The patient was advised to call back or seek an in-person evaluation if the symptoms worsen or if the condition fails to improve as anticipated.   I provided 20 minutes of non-face-to-face time during this encounter.  This includes time for charting and coordination of care   Naomi MARLA Chad, MD

## 2024-05-14 NOTE — Assessment & Plan Note (Signed)
 Discovered during her hospitalization in August 2022 (she was hospitalized for severe UTIs) hemoglobin was 6.9 MCV 72 (1 unit of PRBC and IV iron  x1 dose) Upper endoscopy and colonoscopy performed November 2022: No evidence of bleeding   IV Iron : Dec 2022, Mar 2023, October 2023, May 2024, October 2024, March 2025, June 2025, August 2025, October 2025   Lab Review: 08/02/21: WBC: 3.5, Hb 11.3, Ferritin: 20, Iron  Sat: 7%, TIBC: 553 11/14/21: Ferritin 103, Hb 12.3, Sat: 11% 02/19/2022: Ferritin 21, iron  saturation 7%, TIBC 545, B12 739, hemoglobin 10.6, folate 13.5 06/25/22: Hb 10.9, MCV 82, Iron  Sat 8%, TIBC 487, Ferritin 33 10/17/2022: Hemoglobin 7.4, MCV 68.5, RDW 16, ferritin 6, iron  saturation 3%, TIBC 633 02/05/2023: Hemoglobin 8.6, MCV 71.2, iron  saturation 4%, TIBC 596, ferritin 9 05/08/2023: Hemoglobin 10.9, MCV 81.7, WBC 3.2, iron  saturation 11%, ferritin 75 08/12/2023: Hemoglobin 8.6, MCV 74.3, iron  saturation 3%, TIBC 615, ferritin 8 11/01/2023: Hemoglobin 8.5, MCV 75.6, iron  saturation 4%, ferritin 23 12/05/23: Hemoglobin: 7.3, MCV 83.6, WBC: 1.8, ANC 0.8, Iron  sat: 6%, Ferritin 140 12/22/2023: Hemoglobin 6.5, MCV 75.1, WBC 1.9, ANC 1.1, platelets 246, reticulocyte hemoglobin 15.5, reticulocyte percent: 2.7%, Cr 0.81, Ferritin 11.9, B 12 861 02/05/2024: Iron  saturation 5%, TIBC 456, hemoglobin 7.2, ferritin pending, ANC 1.1, WBC 1.9 03/11/2024: Hemoglobin 6.6, MCV 74.7 (patient is reporting blood in the stool) 03/30/2024: Hemoglobin 9.1, MCV 84.2, LDH 212, iron  saturation 22%, haptoglobin 93, ferritin 627 04/13/2024: Hemoglobin 10.8, MCV 83.7, ferritin 199, iron  saturation 11%, B12 730, DAT negative   05/12/2024: Hemoglobin 10.3, MCV 80.7, iron  saturation 6%, TIBC 503, ferritin 39   03/02/2024: Bone marrow biopsy: Cellular bone marrow with trilineage hematopoiesis, cytogenetics 46XX normal 03/23/2024: CT CAP: No evidence of occult malignancy.  Cholelithiasis, chronic gastric wall thickening     Recommendation: Recommended IV iron . Dr. Elicia for endoscopy and colonoscopy for the GI blood loss: 04/15/2024   Discontinued aspirin  therapy Her daughter believes that avocado may be causing her to have bleeding episodes. Labs and follow-up in 3 months   Return to clinic in 1 month with labs and follow-up

## 2024-05-15 ENCOUNTER — Other Ambulatory Visit (HOSPITAL_COMMUNITY): Payer: Self-pay | Admitting: Hematology and Oncology

## 2024-05-15 ENCOUNTER — Telehealth: Payer: Self-pay | Admitting: Pharmacy Technician

## 2024-05-15 NOTE — Addendum Note (Signed)
 Addended by: DAYNE SHERRY RAMAN on: 05/15/2024 11:06 AM   Modules accepted: Orders

## 2024-05-15 NOTE — Telephone Encounter (Signed)
 Dr. Gudena, Rick note:  Patient will be scheduled as soon as possible.  Auth Submission: NO AUTH NEEDED Site of care: Site of care: [facility] Payer: [employer] Medication & CPT/J Code(s) submitted: Feraheme  (ferumoxytol ) [id number] Diagnosis Code: D50.9 Route of submission (phone, fax, portal):  Phone # Fax # Auth type: Buy/Bill PB Units/visits requested: X2 DOSES Reference number: [id number] Approval from: 05/15/24 to 06/03/24

## 2024-05-20 ENCOUNTER — Encounter: Admitting: Gastroenterology

## 2024-05-25 ENCOUNTER — Telehealth: Payer: Self-pay

## 2024-05-25 DIAGNOSIS — L409 Psoriasis, unspecified: Secondary | ICD-10-CM

## 2024-05-25 NOTE — Telephone Encounter (Signed)
 Pt called stating that she would like to restart Otezla  for SebDerm. She stated at first she did not think the medication was working but since the medication was D/C at her last OV on 03/12/24 she is starting to having scalp issues again.   Pt aware that DO is out of the office until 06/09/23 and expressed understanding.

## 2024-05-26 ENCOUNTER — Ambulatory Visit (INDEPENDENT_AMBULATORY_CARE_PROVIDER_SITE_OTHER)

## 2024-05-26 VITALS — BP 158/65 | HR 79 | Temp 97.8°F | Resp 16 | Ht 60.25 in | Wt 143.8 lb

## 2024-05-26 DIAGNOSIS — D509 Iron deficiency anemia, unspecified: Secondary | ICD-10-CM | POA: Diagnosis not present

## 2024-05-26 MED ORDER — SODIUM CHLORIDE 0.9 % IV SOLN
510.0000 mg | Freq: Once | INTRAVENOUS | Status: AC
  Administered 2024-05-26: 510 mg via INTRAVENOUS
  Filled 2024-05-26: qty 17

## 2024-05-26 NOTE — Progress Notes (Signed)
 Diagnosis: Iron  Deficiency Anemia  Provider:  Praveen Mannam MD  Procedure: IV Infusion  IV Type: Peripheral, IV Location: L Antecubital   Feraheme  (Ferumoxytol ), Dose: 510 mg  Infusion Start Time: 1317  Infusion Stop Time: 1333  Post Infusion IV Care: Patient declined observation and Peripheral IV Discontinued  Discharge: Condition: Good, Destination: Home . AVS Declined  Performed by:  Leita FORBES Miles, LPN

## 2024-06-01 ENCOUNTER — Encounter (HOSPITAL_COMMUNITY): Payer: Self-pay | Admitting: Gastroenterology

## 2024-06-01 ENCOUNTER — Telehealth: Payer: Self-pay | Admitting: Gastroenterology

## 2024-06-01 NOTE — Progress Notes (Signed)
 Pre op Eval  Patient- [place] Juarez   [facility]  Cardiologist-[place]Alhany MD Pulmonologist-n/a  EKG-03/29/23 Echo-04/12/22 Cath-   n/a     Stress-2014 ICD/PM-n/a GLP1-n/a Blood Thinner-n/a  History; DM ( no meds), OSA, Mur ur, CAD, HTN, CKD3. Patient last saw her cardiologist 03/2023 with recommended f/u in 1 year. They did in their last note say she should get an echo in 04/2024 to follow up on some mild AS. Per patient not having any cardiac symptoms, feels tired from low iron . She also saw her pcp 12/15. Denies any equipment use. Anesthesia Review- Yes- okay to proceed

## 2024-06-01 NOTE — Telephone Encounter (Addendum)
 Procedure:Endoscopy/Colonoscopy Procedure date: 06/11/24 Procedure location: [facility] Arrival Time: 9:00 am Spoke with the patient Y/N: Yes Any prep concerns? No  Has the patient obtained the prep from the pharmacy ? Yes Do you have a care partner and transportation: Yes Any additional concerns? No

## 2024-06-02 ENCOUNTER — Ambulatory Visit (INDEPENDENT_AMBULATORY_CARE_PROVIDER_SITE_OTHER)

## 2024-06-02 VITALS — BP 145/67 | HR 81 | Temp 98.3°F | Resp 14 | Ht 60.25 in | Wt 143.2 lb

## 2024-06-02 DIAGNOSIS — D509 Iron deficiency anemia, unspecified: Secondary | ICD-10-CM | POA: Diagnosis not present

## 2024-06-02 MED ORDER — SODIUM CHLORIDE 0.9 % IV SOLN
510.0000 mg | Freq: Once | INTRAVENOUS | Status: AC
  Administered 2024-06-02: 510 mg via INTRAVENOUS
  Filled 2024-06-02: qty 17

## 2024-06-02 NOTE — Progress Notes (Signed)
 Diagnosis:  Iron  Deficiency Anemia  Provider:  Praveen Mannam MD  Procedure: IV Infusion  IV Type: Peripheral, IV Location: L Antecubital   Feraheme  (Ferumoxytol ), Dose: 510 mg  Infusion Start Time: 1333  Infusion Stop Time: 1356  Post Infusion IV Care: Patient declined observation and Peripheral IV Discontinued  Discharge: Condition: Good, Destination: Home . AVS Provided  Performed by:  Leita FORBES Miles, LPN

## 2024-06-08 NOTE — Telephone Encounter (Signed)
 Pt is/was using otezla  for psoriasis, you can resend the rx under that diagnosis.  Thanks

## 2024-06-09 MED ORDER — OTEZLA 30 MG PO TABS: 30.0000 mg | ORAL_TABLET | Freq: Two times a day (BID) | ORAL | 5 refills | Status: DC

## 2024-06-09 NOTE — Telephone Encounter (Signed)
"  New Rx sent  "

## 2024-06-09 NOTE — Addendum Note (Signed)
 Addended by: Margerite Impastato U on: 06/09/2024 08:07 AM   Modules accepted: Orders

## 2024-06-11 ENCOUNTER — Ambulatory Visit (HOSPITAL_COMMUNITY)
Admission: RE | Admit: 2024-06-11 | Discharge: 2024-06-11 | Disposition: A | Discharge status: Home / Self Care | Attending: Gastroenterology | Admitting: Gastroenterology

## 2024-06-11 ENCOUNTER — Ambulatory Visit (HOSPITAL_COMMUNITY)

## 2024-06-11 ENCOUNTER — Encounter (HOSPITAL_COMMUNITY)
Admission: RE | Disposition: A | Discharge status: Home / Self Care | Payer: Self-pay | Source: Home / Self Care | Attending: Gastroenterology

## 2024-06-11 ENCOUNTER — Other Ambulatory Visit: Payer: Self-pay

## 2024-06-11 ENCOUNTER — Encounter (HOSPITAL_COMMUNITY): Payer: Self-pay | Admitting: Gastroenterology

## 2024-06-11 DIAGNOSIS — K449 Diaphragmatic hernia without obstruction or gangrene: Secondary | ICD-10-CM | POA: Diagnosis not present

## 2024-06-11 DIAGNOSIS — I080 Rheumatic disorders of both mitral and aortic valves: Secondary | ICD-10-CM | POA: Diagnosis not present

## 2024-06-11 DIAGNOSIS — K317 Polyp of stomach and duodenum: Secondary | ICD-10-CM | POA: Diagnosis not present

## 2024-06-11 DIAGNOSIS — M1990 Unspecified osteoarthritis, unspecified site: Secondary | ICD-10-CM | POA: Insufficient documentation

## 2024-06-11 DIAGNOSIS — G4730 Sleep apnea, unspecified: Secondary | ICD-10-CM | POA: Insufficient documentation

## 2024-06-11 DIAGNOSIS — D123 Benign neoplasm of transverse colon: Secondary | ICD-10-CM

## 2024-06-11 DIAGNOSIS — K5520 Angiodysplasia of colon without hemorrhage: Secondary | ICD-10-CM | POA: Diagnosis not present

## 2024-06-11 DIAGNOSIS — D125 Benign neoplasm of sigmoid colon: Secondary | ICD-10-CM

## 2024-06-11 DIAGNOSIS — I129 Hypertensive chronic kidney disease with stage 1 through stage 4 chronic kidney disease, or unspecified chronic kidney disease: Secondary | ICD-10-CM | POA: Diagnosis not present

## 2024-06-11 DIAGNOSIS — K635 Polyp of colon: Secondary | ICD-10-CM | POA: Diagnosis not present

## 2024-06-11 DIAGNOSIS — K31819 Angiodysplasia of stomach and duodenum without bleeding: Secondary | ICD-10-CM | POA: Insufficient documentation

## 2024-06-11 DIAGNOSIS — D509 Iron deficiency anemia, unspecified: Secondary | ICD-10-CM

## 2024-06-11 DIAGNOSIS — K641 Second degree hemorrhoids: Secondary | ICD-10-CM | POA: Insufficient documentation

## 2024-06-11 DIAGNOSIS — E1122 Type 2 diabetes mellitus with diabetic chronic kidney disease: Secondary | ICD-10-CM | POA: Diagnosis not present

## 2024-06-11 DIAGNOSIS — K2289 Other specified disease of esophagus: Secondary | ICD-10-CM | POA: Diagnosis not present

## 2024-06-11 DIAGNOSIS — I2510 Atherosclerotic heart disease of native coronary artery without angina pectoris: Secondary | ICD-10-CM | POA: Diagnosis not present

## 2024-06-11 DIAGNOSIS — N183 Chronic kidney disease, stage 3 unspecified: Secondary | ICD-10-CM | POA: Insufficient documentation

## 2024-06-11 DIAGNOSIS — Q2730 Arteriovenous malformation, site unspecified: Secondary | ICD-10-CM

## 2024-06-11 DIAGNOSIS — D500 Iron deficiency anemia secondary to blood loss (chronic): Secondary | ICD-10-CM | POA: Diagnosis not present

## 2024-06-11 DIAGNOSIS — K644 Residual hemorrhoidal skin tags: Secondary | ICD-10-CM | POA: Insufficient documentation

## 2024-06-11 DIAGNOSIS — K219 Gastro-esophageal reflux disease without esophagitis: Secondary | ICD-10-CM | POA: Insufficient documentation

## 2024-06-11 HISTORY — PX: COLONOSCOPY: SHX5424

## 2024-06-11 HISTORY — PX: SUBMUCOSAL INJECTION: SHX5543

## 2024-06-11 HISTORY — PX: BALLOON ENTEROSCOPY: SHX6863

## 2024-06-11 HISTORY — PX: HEMOSTASIS CLIP PLACEMENT: SHX6857

## 2024-06-11 HISTORY — PX: HOT HEMOSTASIS: SHX5433

## 2024-06-11 MED ORDER — FENTANYL CITRATE (PF) 100 MCG/2ML IJ SOLN
INTRAMUSCULAR | Status: DC | PRN
  Administered 2024-06-11 (×2): 25 ug via INTRAVENOUS
  Administered 2024-06-11: 50 ug via INTRAVENOUS

## 2024-06-11 MED ORDER — PHENYLEPHRINE 80 MCG/ML (10ML) SYRINGE FOR IV PUSH (FOR BLOOD PRESSURE SUPPORT)
PREFILLED_SYRINGE | INTRAVENOUS | Status: DC | PRN
  Administered 2024-06-11: 80 ug via INTRAVENOUS

## 2024-06-11 MED ORDER — SODIUM CHLORIDE 0.9 % IV SOLN: INTRAVENOUS | Status: DC | PRN

## 2024-06-11 MED ORDER — ESMOLOL HCL 100 MG/10ML IV SOLN
INTRAVENOUS | Status: DC | PRN
  Administered 2024-06-11 (×2): 10 mg via INTRAVENOUS

## 2024-06-11 MED ORDER — SPOT INK MARKER SYRINGE KIT
PACK | SUBMUCOSAL | Status: DC | PRN
  Administered 2024-06-11: 1.5 mL via SUBMUCOSAL

## 2024-06-11 MED ORDER — PHENYLEPHRINE HCL-NACL 20-0.9 MG/250ML-% IV SOLN
INTRAVENOUS | Status: DC | PRN
  Administered 2024-06-11: 35 ug/min via INTRAVENOUS

## 2024-06-11 MED ORDER — PROPOFOL 1000 MG/100ML IV EMUL
INTRAVENOUS | Status: AC
  Filled 2024-06-11: qty 100

## 2024-06-11 MED ORDER — BUTAMBEN-TETRACAINE-BENZOCAINE 2-2-14 % EX AERO
INHALATION_SPRAY | CUTANEOUS | Status: DC | PRN
  Administered 2024-06-11: 2 via TOPICAL

## 2024-06-11 MED ORDER — SODIUM CHLORIDE 0.9 % IV SOLN: INTRAVENOUS | Status: DC

## 2024-06-11 MED ORDER — FENTANYL CITRATE (PF) 100 MCG/2ML IJ SOLN
INTRAMUSCULAR | Status: AC
  Filled 2024-06-11: qty 2

## 2024-06-11 MED ORDER — SODIUM CHLORIDE (PF) 0.9 % IJ SOLN
INTRAMUSCULAR | Status: DC | PRN
  Administered 2024-06-11: 2 mL
  Administered 2024-06-11: 5 mL

## 2024-06-11 MED ORDER — PROPOFOL 500 MG/50ML IV EMUL
INTRAVENOUS | Status: DC | PRN
  Administered 2024-06-11: 110 ug/kg/min via INTRAVENOUS
  Administered 2024-06-11: 50 mg via INTRAVENOUS

## 2024-06-11 MED ORDER — LIDOCAINE 2% (20 MG/ML) 5 ML SYRINGE
INTRAMUSCULAR | Status: DC | PRN
  Administered 2024-06-11: 100 mg via INTRAVENOUS

## 2024-06-11 NOTE — Anesthesia Preprocedure Evaluation (Signed)
"                                    Anesthesia Evaluation  Patient identified by MRN, date of birth, ID band Patient awake    Reviewed: Allergy & Precautions, NPO status , Patient's Chart, lab work & pertinent test results  History of Anesthesia Complications (+) PONV and history of anesthetic complications  Airway Mallampati: II  TM Distance: >3 FB Neck ROM: Full    Dental  (+) Teeth Intact, Dental Advisory Given   Pulmonary sleep apnea    breath sounds clear to auscultation       Cardiovascular hypertension, Pt. on medications + CAD  + Valvular Problems/Murmurs AS  Rhythm:Regular Rate:Normal   TTE (2023): 1. Left ventricular ejection fraction, by estimation, is 60 to 65%. Left  ventricular ejection fraction by 3D volume is 64 %. The left ventricle has  normal function. The left ventricle has no regional wall motion  abnormalities. Left ventricular diastolic   parameters are consistent with Grade I diastolic dysfunction (impaired  relaxation).   2. Right ventricular systolic function is normal. The right ventricular  size is normal.   3. The mitral valve is normal in structure. Trivial mitral valve  regurgitation. No evidence of mitral stenosis.   4. The aortic valve is tricuspid. There is mild calcification of the  aortic valve. Aortic valve regurgitation is mild. Mild aortic valve  stenosis. Aortic valve mean gradient measures 13.0 mmHg. Aortic valve Vmax  measures 2.42 m/s.   5. The inferior vena cava is normal in size with greater than 50%  respiratory variability, suggesting right atrial pressure of 3 mmHg.      Neuro/Psych  Headaches PSYCHIATRIC DISORDERS Anxiety Depression       GI/Hepatic Neg liver ROS,GERD  Medicated and Controlled,,  Endo/Other  diabetes, Type 2    Renal/GU Renal InsufficiencyRenal disease     Musculoskeletal  (+) Arthritis ,    Abdominal   Peds  Hematology  (+) Blood dyscrasia, anemia   Anesthesia Other  Findings   Reproductive/Obstetrics                              Anesthesia Physical Anesthesia Plan  ASA: 3  Anesthesia Plan: MAC   Post-op Pain Management:    Induction: Intravenous  PONV Risk Score and Plan: 2  Airway Management Planned: Natural Airway and Simple Face Mask  Additional Equipment: None  Intra-op Plan:   Post-operative Plan:   Informed Consent: I have reviewed the patients History and Physical, chart, labs and discussed the procedure including the risks, benefits and alternatives for the proposed anesthesia with the patient or authorized representative who has indicated his/her understanding and acceptance.     Dental advisory given  Plan Discussed with: CRNA  Anesthesia Plan Comments:          Anesthesia Quick Evaluation  "

## 2024-06-11 NOTE — H&P (Signed)
 "  GASTROENTEROLOGY PROCEDURE H&P NOTE   Primary Care Physician: Lynwood Laneta ORN, PA-C  HPI: Shirley Juarez is a 74 y.o. female who presents for SBE + Colonoscopy for treatment of AVMs for IDA.  Past Medical History:  Diagnosis Date   Anxiety    Arthritis    Osteoarthritis- knee and back   CKD (chronic kidney disease), stage III (HCC)    Coronary artery disease    Cath 2004 in [place], [place] with minor CAD   Depression    Diabetes mellitus    On oral and diet   GERD (gastroesophageal reflux disease)    Heart murmur    Hyperlipidemia    Hypertension    Leaky heart valve    Osteopenia    PONV (postoperative nausea and vomiting)    Sleep apnea    does not use CPAP- it is broken.  Last sleep study was 7 years ago.  Has not worn in  4  years   Past Surgical History:  Procedure Laterality Date   ABDOMINAL HYSTERECTOMY     COLONOSCOPY     Dr.Mann   IR BONE MARROW BIOPSY & ASPIRATION  03/02/2024   KNEE ARTHROCENTESIS     TOTAL KNEE ARTHROPLASTY  06/22/2011   Bilateral    No current facility-administered medications for this encounter.   Current Medications[1] Allergies[2] Family History  Problem Relation Age of Onset   Heart attack Mother 53   Liver disease Father    Heart attack Son        Age 52, he is our patient   Anesthesia problems Neg Hx    Colon cancer Neg Hx    Esophageal cancer Neg Hx    Stomach cancer Neg Hx    Colon polyps Neg Hx    Ulcerative colitis Neg Hx    Social History   Socioeconomic History   Marital status: Married    Spouse name: Not on file   Number of children: 2   Years of education: Not on file   Highest education level: Not on file  Occupational History   Occupation: Retired [employer] Manager [employer]  Tobacco Use   Smoking status: Never   Smokeless tobacco: Never  Vaping Use   Vaping status: Never Used  Substance and Sexual Activity   Alcohol use: No   Drug use: Not Currently   Sexual activity: Yes    Birth control/protection: Surgical   Other Topics Concern   Not on file  Social History Narrative   Not on file   Social Drivers of Health   Tobacco Use: Low Risk (06/11/2024)   Patient History    Smoking Tobacco Use: Never    Smokeless Tobacco Use: Never    Passive Exposure: Not on file  Financial Resource Strain: Not on file  Food Insecurity: Low Risk (05/18/2024)   Received from [facility]   Epic    Within the past 12 months, you worried that your food would run out before you got money to buy more: Never true    Within the past 12 months, the food you bought just didn't last and you didn't have money to get more. : Never true  Transportation Needs: No Transportation Needs (05/18/2024)   Received from [facility]    In the past 12 months, has lack of reliable transportation kept you from medical appointments, meetings, work or from getting things needed for daily living? : No  Physical Activity: Not on file  Stress: Not on  file  Social Connections: Not on file  Intimate Partner Violence: Not on file  Depression [phone]): Low Risk (02/08/2024)   Depression (PHQ2-9)    PHQ-2 Score: 0  Alcohol Screen: Not on file  Housing: Low Risk (05/18/2024)   Received from [facility]   Epic    What is your living situation today?: I have a steady place to live    Think about the place you live. Do you have problems with any of the following? Choose all that apply:: None/None on this list  Utilities: Low Risk (05/18/2024)   Received from [facility]   Utilities    In the past 12 months has the electric, gas, oil, or water company threatened to shut off services in your home? : No  Health Literacy: Not on file    Physical Exam: There were no vitals filed for this visit. There is no height or weight on file to calculate BMI. GEN: NAD EYE: Sclerae anicteric ENT: MMM CV: Non-tachycardic GI: Soft, NT/ND NEURO:  Alert & Oriented x 3  Lab Results: No results for input(s): WBC, HGB, HCT,  PLT in the last 72 hours. BMET No results for input(s): NA, K, CL, CO2, GLUCOSE, BUN, CREATININE, CALCIUM  in the last 72 hours. LFT No results for input(s): PROT, ALBUMIN, AST, ALT, ALKPHOS, BILITOT, BILIDIR, IBILI in the last 72 hours. PT/INR No results for input(s): LABPROT, INR in the last 72 hours.   Impression / Plan: This is a 74 y.o.female who presents for SBE + Colonoscopy for treatment of AVMs for IDA.  The risks and benefits of endoscopic evaluation/treatment were discussed with the patient and/or family; these include but are not limited to the risk of perforation, infection, bleeding, missed lesions, lack of diagnosis, severe illness requiring hospitalization, as well as anesthesia and sedation related illnesses.  The patient's history has been reviewed, patient examined, no change in status, and deemed stable for procedure.  The patient and/or family was provided an opportunity to ask questions and all were answered.  The patient and/or family is agreeable to proceed.    Shirley Finner, MD [facility] Office # [phone]     [1] No current facility-administered medications for this encounter. [2]  Allergies Allergen Reactions   Covid-19 (Mrna Bivalent) Vaccine Proofreader) [Covid-19 (Mrna) Vaccine] Hives   Latex Rash   Peanut-Containing Drug Products Hives   Codeine Nausea And Vomiting   Ezetimibe  Nausea And Vomiting   Fish Oil Nausea And Vomiting   Hydralazine  Other (See Comments) and Cough    100 mg three times a day causes excessive coughing   Other Other (See Comments)    Pet dander   Statins Nausea And Vomiting   Welchol [Colesevelam Hcl] Nausea And Vomiting   Zithromax [Azithromycin] Nausea And Vomiting   "

## 2024-06-11 NOTE — Anesthesia Procedure Notes (Signed)
 Procedure Name: MAC Date/Time: 06/11/2024 9:58 AM  Performed by: Obadiah Reyes BROCKS, CRNAPre-anesthesia Checklist: Patient identified, Emergency Drugs available, Suction available, Patient being monitored and Timeout performed Patient Re-evaluated:Patient Re-evaluated prior to induction Oxygen Delivery Method: Simple face mask Preoxygenation: Pre-oxygenation with 100% oxygen

## 2024-06-11 NOTE — Transfer of Care (Signed)
 Immediate Anesthesia Transfer of Care Note  Patient: Shirley Juarez  Procedure(s) Performed: ENTEROSCOPY COLONOSCOPY EGD, WITH ARGON PLASMA COAGULATION INJECTION, SUBMUCOSAL CONTROL OF HEMORRHAGE, GI TRACT, ENDOSCOPIC, BY CLIPPING OR OVERSEWING COLONOSCOPY, WITH ARGON PLASMA COAGULATION  Patient Location: PACU  Anesthesia Type:MAC  Level of Consciousness: awake, alert , and oriented  Airway & Oxygen Therapy: Patient Spontanous Breathing and Patient connected to face mask oxygen  Post-op Assessment: Report given to RN and Post -op Vital signs reviewed and stable  Post vital signs: Reviewed and stable  Last Vitals:  Vitals Value Taken Time  BP    Temp    Pulse 79 06/11/24 11:05  Resp 13 06/11/24 11:05  SpO2 98 % 06/11/24 11:05    Last Pain:  Vitals:   06/11/24 0908  TempSrc: Temporal  PainSc: 0-No pain         Complications: No notable events documented.

## 2024-06-11 NOTE — Discharge Instructions (Signed)

## 2024-06-11 NOTE — Op Note (Signed)
 [facility] Patient Name: Shirley Juarez Procedure Date: 06/11/2024 MRN: [id number] Attending MD: Aloha Finner , MD, [id number] Date of Birth: [birth year] CSN: [id number] Age: 74 Admit Type: Outpatient Procedure:                Colonoscopy Indications:              Iron  deficiency anemia secondary to chronic blood                            loss, Iron  deficiency anemia, Angioectasia, For                            therapy of angioectasia Providers:                Aloha Finner, MD, Almarie Masters, RN, Farris Southgate, Technician Referring MD:              Medicines:                Monitored Anesthesia Care Complications:            No immediate complications. Estimated Blood Loss:     Estimated blood loss was minimal. Procedure:                Pre-Anesthesia Assessment:                           - Prior to the procedure, a History and Physical                            was performed, and patient medications and                            allergies were reviewed. The patient's tolerance of                            previous anesthesia was also reviewed. The risks                            and benefits of the procedure and the sedation                            options and risks were discussed with the patient.                            All questions were answered, and informed consent                            was obtained. Prior Anticoagulants: The patient has                            taken no anticoagulant or antiplatelet agents. ASA                            Grade Assessment:  III - A patient with severe                            systemic disease. After reviewing the risks and                            benefits, the patient was deemed in satisfactory                            condition to undergo the procedure.                           After obtaining informed consent, the colonoscope                            was passed  under direct vision. Throughout the                            procedure, the patient's blood pressure, pulse, and                            oxygen saturations were monitored continuously. The                            PCF-HQ190DL ([id number]) Olympus colonscope was                            introduced through the anus and advanced to the the                            cecum, identified by appendiceal orifice and                            ileocecal valve. The colonoscopy was performed                            without difficulty. The patient tolerated the                            procedure. The quality of the bowel preparation was                            good. The ileocecal valve, appendiceal orifice, and                            rectum were photographed. Scope In: 10:28:42 AM Scope Out: 10:54:58 AM Scope Withdrawal Time: 0 hours 23 minutes 32 seconds  Total Procedure Duration: 0 hours 26 minutes 16 seconds  Findings:      The digital rectal exam findings include hemorrhoids. Pertinent       negatives include no palpable rectal lesions.      Two sessile polyps were found in the sigmoid colon and hepatic flexure.       The polyps were 1 mm in size. These polyps were removed with a cold  snare. Resection and retrieval were complete.      14 small to large angioectasias with typical arborization were found in       the sigmoid colon (1), at the splenic flexure (1), in the transverse       colon (4), at the hepatic flexure (1), in the ascending colon (4) and in       the cecum (3). One of the larger AVMs in the cecum was successfully       injected with 3 mL saline for lesion assessment, and this injection       appeared to lift the lesion adequately. Fulguration to ablate the lesion       to prevent bleeding by argon plasma was successful. To prevent bleeding       post-intervention, one hemostatic clip was successfully placed. There       was no bleeding at the end of the  procedure. One of the larger AVMs in       the ascending colon was successfully injected with 3 mL saline for       lesion assessment, and this injection appeared to lift the lesion       adequately. Fulguration to ablate the lesion to prevent bleeding by       argon plasma was successful. To prevent bleeding post-intervention, one       hemostatic clip was successfully placed. There was no bleeding at the       end of the procedure. Fulguration to ablate the rest of the AVMs to       prevent bleeding by argon plasma was successful.      Normal mucosa was found in the entire colon otherwise.      Non-bleeding non-thrombosed external and internal hemorrhoids were found       during retroflexion, during perianal exam and during digital exam. The       hemorrhoids were Grade II (internal hemorrhoids that prolapse but reduce       spontaneously). Impression:               - Hemorrhoids found on digital rectal exam.                           - Two 1 mm polyps in the sigmoid colon and at the                            hepatic flexure, removed with a cold snare.                            Resected and retrieved.                           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 Injected the 2 largest                            AVMs with saline and then proceeded with APC                            treatment to all of the angioectasias. 2 clips  placed at sites of largest AVM ablations.                           - Normal mucosa in the entire examined colon                            otherwise.                           - Non-bleeding non-thrombosed external and internal                            hemorrhoids. Moderate Sedation:      Not Applicable - Patient had care per Anesthesia. Recommendation:           - The patient will be observed post-procedure,                            until all discharge criteria are met.                           - Discharge patient to home.                            - Patient has a contact number available for                            emergencies. The signs and symptoms of potential                            delayed complications were discussed with the                            patient. Return to normal activities tomorrow.                            Written discharge instructions were provided to the                            patient.                           - High fiber diet.                           - Use FiberCon 1-2 tablets PO daily.                           - Await pathology results.                           - Recommend repeat CBC/iron /TIBC/ferritin to be                            drawn in 2 months. If still evidence of iron   deficiency anemia, recommend video capsule                            endoscopy.                           - Colonoscopy for surveillance of previous polyps                            in 3-years.                           - The findings and recommendations were discussed                            with the patient.                           - The findings and recommendations were discussed                            with the patient's family. Procedure Code(s):        --- Professional ---                           [phone], 59, Colonoscopy, flexible; with control of                            bleeding, any method                           45385, Colonoscopy, flexible; with removal of                            tumor(s), polyp(s), or other lesion(s) by snare                            technique                           45381, 59, Colonoscopy, flexible; with directed                            submucosal injection(s), any substance Diagnosis Code(s):        --- Professional ---                           D12.5, Benign neoplasm of sigmoid colon                           D12.3, Benign neoplasm of transverse colon (hepatic                            flexure or splenic  flexure)                           K55.20, Angiodysplasia of colon without hemorrhage  K64.1, Second degree hemorrhoids                           D50.0, Iron  deficiency anemia secondary to blood                            loss (chronic)                           D50.9, Iron  deficiency anemia, unspecified CPT copyright 2022 American [employer]. All rights reserved. The codes documented in this report are preliminary and upon coder review may  be revised to meet current compliance requirements. Aloha Finner, MD 06/11/2024 11:26:49 AM Number of Addenda: 0

## 2024-06-11 NOTE — Op Note (Signed)
 [facility] Patient Name: Shirley Juarez Procedure Date: 06/11/2024 MRN: [id number] Attending MD: Aloha Finner , MD, [id number] Date of Birth: 01-06-1951 CSN: [id number] Age: 74 Admit Type: Outpatient Procedure:                Small bowel enteroscopy Indications:              Iron  deficiency anemia secondary to chronic blood                            loss, Iron  deficiency anemia, Arteriovenous                            malformation in the stomach, Arteriovenous                            malformation in the small intestine Providers:                Aloha Finner, MD, Almarie Masters, RN, Farris Southgate, Technician Referring MD:              Medicines:                Monitored Anesthesia Care Complications:            No immediate complications. Estimated Blood Loss:     Estimated blood loss was minimal. Procedure:                Pre-Anesthesia Assessment:                           - Prior to the procedure, a History and Physical                            was performed, and patient medications and                            allergies were reviewed. The patient's tolerance of                            previous anesthesia was also reviewed. The risks                            and benefits of the procedure and the sedation                            options and risks were discussed with the patient.                            All questions were answered, and informed consent                            was obtained. Prior Anticoagulants: The patient has                            taken  no anticoagulant or antiplatelet agents                            except for aspirin . ASA Grade Assessment: III - A                            patient with severe systemic disease. After                            reviewing the risks and benefits, the patient was                            deemed in satisfactory condition to undergo the                             procedure.                           After obtaining informed consent, the endoscope was                            passed under direct vision. Throughout the                            procedure, the patient's blood pressure, pulse, and                            oxygen saturations were monitored continuously. The                            PCF-HQ190DL ([id number]) Olympus colonscope was                            introduced through the mouth, and advanced to the                            proximal jejunum. After obtaining informed consent,                            the endoscope was passed under direct vision.                            Throughout the procedure, the patient's blood                            pressure, pulse, and oxygen saturations were                            monitored continuously.The small bowel enteroscopy                            was accomplished without difficulty. The patient                            tolerated the procedure.  Scope In: Scope Out: Findings:      No gross lesions were noted in the entire esophagus.      The Z-line was irregular and was found 35 cm from the incisors.      A 2 cm hiatal hernia was present.      Multiple small semi-sessile polyps with no bleeding and no stigmata of       recent bleeding were found in the gastric fundus and in the gastric body       (these are consistent with fundic gland polyps).      Three small angioectasias with no bleeding were found in the gastric       body. Fulguration to ablate the lesion to prevent bleeding by argon       plasma (gastric settings) was successful.      No other gross lesions were noted in the entire examined stomach.      Four angioectasias with no bleeding were found in the duodenal bulb (2),       in the third portion (1) of the duodenum and in the fourth portion (1)       of the duodenum.      Normal mucosa was found in the entire duodenum otherwise.      Two angioectasias with  no bleeding were found in the proximal jejunum.       Coagulation for bleeding prevention using argon plasma was successful       (right colon settings).      Normal mucosa was found in the rest of the visualized proximal jejunum.       Area was tattooed with an injection of Spot (carbon black) to demarcate       distal extent of today's SBE. Impression:               - No gross lesions in the entire esophagus. Z-line                            irregular, 35 cm from the incisors.                           - 2 cm hiatal hernia.                           - Multiple gastric polyps (fundic gland).                           - Three non-bleeding angioectasias in the stomach.                            Treated with argon plasma coagulation (APC).                           - No other gross lesions in the entire stomach.                           - Four non-bleeding angioectasias in the duodenum.                           - Normal mucosa was found in the entire examined  duodenum otherwise.                           - Two non-bleeding angioectasias in the visualized                            proximal jejunum.                           - Normal mucosa was found in the visualized                            proximal jejunum. Tattooed distal extent of today's                            SBE. Recommendation:           - Proceed to scheduled colonoscopy.                           - Continue present medications.                           - Further recommendations in regards to iron                             deficiency anemia evaluation on colonoscopy report.                           - The findings and recommendations were discussed                            with the patient.                           - The findings and recommendations were discussed                            with the patient's family. Procedure Code(s):        --- Professional ---                            [id number], Small intestinal endoscopy, enteroscopy                            beyond second portion of duodenum, not including                            ileum; with control of bleeding (eg, injection,                            bipolar cautery, unipolar cautery, laser, heater                            probe, stapler, plasma coagulator)  55200, Unlisted procedure, small intestine Diagnosis Code(s):        --- Professional ---                           K22.89, Other specified disease of esophagus                           K44.9, Diaphragmatic hernia without obstruction or                            gangrene                           K31.7, Polyp of stomach and duodenum                           K31.819, Angiodysplasia of stomach and duodenum                            without bleeding                           K55.20, Angiodysplasia of colon without hemorrhage                           D50.0, Iron  deficiency anemia secondary to blood                            loss (chronic)                           D50.9, Iron  deficiency anemia, unspecified CPT copyright 2022 American Medical Association. All rights reserved. The codes documented in this report are preliminary and upon coder review may  be revised to meet current compliance requirements. Aloha Finner, MD 06/11/2024 11:11:17 AM Number of Addenda: 0

## 2024-06-12 ENCOUNTER — Ambulatory Visit: Payer: Self-pay | Admitting: Gastroenterology

## 2024-06-12 ENCOUNTER — Encounter (HOSPITAL_COMMUNITY): Payer: Self-pay | Admitting: Gastroenterology

## 2024-06-12 LAB — SURGICAL PATHOLOGY

## 2024-06-14 NOTE — Anesthesia Postprocedure Evaluation (Signed)
"   Anesthesia Post Note  Patient: SHERILEE SMOTHERMAN  Procedure(s) Performed: ENTEROSCOPY COLONOSCOPY EGD, WITH ARGON PLASMA COAGULATION INJECTION, SUBMUCOSAL CONTROL OF HEMORRHAGE, GI TRACT, ENDOSCOPIC, BY CLIPPING OR OVERSEWING COLONOSCOPY, WITH ARGON PLASMA COAGULATION     Patient location during evaluation: Endoscopy Anesthesia Type: MAC Level of consciousness: awake Pain management: pain level controlled Vital Signs Assessment: post-procedure vital signs reviewed and stable Respiratory status: spontaneous breathing Cardiovascular status: blood pressure returned to baseline Postop Assessment: no apparent nausea or vomiting Anesthetic complications: no   No notable events documented.  Last Vitals:  Vitals:   06/11/24 1140 06/11/24 1145  BP: (!) 162/58   Pulse: 74 73  Resp: 11 11  Temp:    SpO2: 93% 93%    Last Pain:  Vitals:   06/11/24 1145  TempSrc:   PainSc: 0-No pain                 Murice Barbar T Colhoun      "

## 2024-06-17 ENCOUNTER — Telehealth: Payer: Self-pay | Admitting: Gastroenterology

## 2024-06-17 ENCOUNTER — Telehealth: Payer: Self-pay | Admitting: *Deleted

## 2024-06-17 NOTE — Telephone Encounter (Signed)
 Pt to have labs with Dr Odean- not Dr Wilhelmenia.  Attempted to reach pt but line rings busy   I have sent a message to My Chart - she does view her messages

## 2024-06-17 NOTE — Telephone Encounter (Signed)
 Received call from pt stating Dr. Wilhelmenia with GI was requesting for her to f/u in his office with labs in February.  Pt contacted our office to schedule appt.  RN educated that we are unable to schedule for Dr. Wilhelmenia and advised pt to contact his office.  Pt also educated to contact our office if she needs lab and MD f/u with our provider instead.  Pt verbalized understanding.

## 2024-06-17 NOTE — Telephone Encounter (Signed)
 Inbound call from patient stating that she would like to know where Dr. Wilhelmenia would like her to have labs done. Patient was told she needed to have them done on either the 14 or 15. Please advise.

## 2024-06-19 ENCOUNTER — Telehealth (HOSPITAL_BASED_OUTPATIENT_CLINIC_OR_DEPARTMENT_OTHER): Payer: Self-pay

## 2024-06-19 NOTE — Telephone Encounter (Signed)
 Cataract procedures are low risk and do not typically require specific preoperative testing or holding of blood thinner therapy. However, patient has not been seen in our office in over 1 year (03/2023). She has an office visit scheduled with Dr. Verlin on 07/30/2024; therefore, formal pre-op evaluation can be completed at that time. I will route clearance form to Dr. Verlin and add pre-op eval to appointment notes so that he is aware.  Will remove from pre-op pool.  Teniyah Seivert E Raidon Swanner, PA-C 06/19/2024 3:43 PM

## 2024-06-19 NOTE — Telephone Encounter (Signed)
"  ° °  Pre-operative Risk Assessment    Patient Name: Shirley Juarez  DOB: 1951-05-20 MRN: [id number]   Date of last office visit: 03/29/23 w/ Dr. Lonni Cash MD Date of next office visit: 07/30/24 w/ Dr. Lonni Cash MD   Request for Surgical Clearance    Procedure:  Cataract extraction w/ intraocular lens implantation of the Right Eye followed by the Left Eye  Date of Surgery:  Clearance 08/26/24-09/09/24                               Surgeon:  Dr. Evalene Raw Surgeon's Group or Practice Name:  [facility] Phone number:  [phone] Fax number:  [phone]   Type of Clearance Requested:   - Medical    Type of Anesthesia:  Not Indicated   Additional requests/questions:    Bonney Huxley Bishoy Cupp   06/19/2024, 12:07 PM   "

## 2024-06-22 ENCOUNTER — Other Ambulatory Visit: Payer: Self-pay | Admitting: *Deleted

## 2024-06-25 ENCOUNTER — Telehealth: Payer: Self-pay

## 2024-06-25 DIAGNOSIS — Q2730 Arteriovenous malformation, site unspecified: Secondary | ICD-10-CM

## 2024-06-25 DIAGNOSIS — D509 Iron deficiency anemia, unspecified: Secondary | ICD-10-CM

## 2024-06-25 NOTE — Telephone Encounter (Deleted)
-----   Message from Aloha Finner, MD sent at 06/11/2024 11:44 AM EST ----- Regarding: Follow up Labs Shirley Juarez, This patient will need CBC/iron /TIBC/ferritin to be drawn in 4 to 5 weeks.  She will come to our lab to get those done. Please place those orders. Thanks. GM

## 2024-06-25 NOTE — Telephone Encounter (Signed)
-----   Message from Aloha Finner, MD sent at 06/11/2024 11:44 AM EST ----- Regarding: Follow up Labs Shirley Juarez, This patient will need CBC/iron /TIBC/ferritin to be drawn in 4 to 5 weeks.  She will come to our lab to get those done. Please place those orders. Thanks. GM

## 2024-06-25 NOTE — Telephone Encounter (Deleted)
Opened encounter on error

## 2024-06-25 NOTE — Telephone Encounter (Signed)
 Lab order placed.

## 2024-07-06 ENCOUNTER — Encounter: Payer: Self-pay | Admitting: Cardiovascular Disease

## 2024-07-06 ENCOUNTER — Ambulatory Visit: Admitting: Cardiovascular Disease

## 2024-07-06 VITALS — BP 124/84 | HR 83 | Ht 61.25 in | Wt 145.5 lb

## 2024-07-06 DIAGNOSIS — I350 Nonrheumatic aortic (valve) stenosis: Secondary | ICD-10-CM

## 2024-07-06 DIAGNOSIS — R079 Chest pain, unspecified: Secondary | ICD-10-CM | POA: Diagnosis not present

## 2024-07-06 DIAGNOSIS — I2510 Atherosclerotic heart disease of native coronary artery without angina pectoris: Secondary | ICD-10-CM

## 2024-07-06 DIAGNOSIS — I10 Essential (primary) hypertension: Secondary | ICD-10-CM | POA: Diagnosis not present

## 2024-07-17 ENCOUNTER — Inpatient Hospital Stay: Attending: Hematology and Oncology

## 2024-07-20 ENCOUNTER — Inpatient Hospital Stay: Admitting: Hematology and Oncology

## 2024-07-30 ENCOUNTER — Ambulatory Visit: Admitting: Cardiovascular Disease

## 2024-08-05 ENCOUNTER — Ambulatory Visit (HOSPITAL_COMMUNITY)

## 2024-08-13 ENCOUNTER — Inpatient Hospital Stay

## 2024-08-17 ENCOUNTER — Inpatient Hospital Stay: Admitting: Hematology and Oncology

## 2024-08-18 ENCOUNTER — Other Ambulatory Visit (HOSPITAL_COMMUNITY)
